# Patient Record
Sex: Male | Born: 1980
Health system: Southern US, Community
[De-identification: ages and names within clinical notes are randomized; demographics above are authoritative.]

## PROBLEM LIST (undated history)

## (undated) DIAGNOSIS — I10 Essential (primary) hypertension: Secondary | ICD-10-CM

## (undated) DIAGNOSIS — F410 Panic disorder [episodic paroxysmal anxiety] without agoraphobia: Secondary | ICD-10-CM

## (undated) DIAGNOSIS — R931 Abnormal findings on diagnostic imaging of heart and coronary circulation: Secondary | ICD-10-CM

## (undated) HISTORY — PX: OTHER SURGICAL HISTORY: SHX169

## (undated) HISTORY — DX: Panic disorder (episodic paroxysmal anxiety): F41.0

## (undated) HISTORY — DX: Abnormal findings on diagnostic imaging of heart and coronary circulation: R93.1

## (undated) HISTORY — DX: Essential (primary) hypertension: I10

## (undated) HISTORY — PX: MOLE REMOVAL: SHX2046

---

## 2019-04-29 ENCOUNTER — Ambulatory Visit: Payer: Self-pay | Admitting: Emergency Medicine

## 2019-05-01 DIAGNOSIS — Z20828 Contact with and (suspected) exposure to other viral communicable diseases: Secondary | ICD-10-CM | POA: Diagnosis not present

## 2019-05-04 DIAGNOSIS — Z20828 Contact with and (suspected) exposure to other viral communicable diseases: Secondary | ICD-10-CM | POA: Diagnosis not present

## 2019-05-09 DIAGNOSIS — Z20828 Contact with and (suspected) exposure to other viral communicable diseases: Secondary | ICD-10-CM | POA: Diagnosis not present

## 2019-05-16 ENCOUNTER — Other Ambulatory Visit: Payer: Self-pay

## 2019-05-16 ENCOUNTER — Ambulatory Visit (INDEPENDENT_AMBULATORY_CARE_PROVIDER_SITE_OTHER): Payer: BC Managed Care – PPO | Admitting: Emergency Medicine

## 2019-05-16 ENCOUNTER — Encounter: Payer: Self-pay | Admitting: Emergency Medicine

## 2019-05-16 VITALS — BP 126/72 | HR 86 | Temp 98.6°F | Resp 16 | Ht 71.0 in | Wt 312.6 lb

## 2019-05-16 DIAGNOSIS — B372 Candidiasis of skin and nail: Secondary | ICD-10-CM | POA: Diagnosis not present

## 2019-05-16 DIAGNOSIS — I10 Essential (primary) hypertension: Secondary | ICD-10-CM

## 2019-05-16 DIAGNOSIS — G4733 Obstructive sleep apnea (adult) (pediatric): Secondary | ICD-10-CM

## 2019-05-16 DIAGNOSIS — Z9989 Dependence on other enabling machines and devices: Secondary | ICD-10-CM

## 2019-05-16 DIAGNOSIS — Z7689 Persons encountering health services in other specified circumstances: Secondary | ICD-10-CM

## 2019-05-16 MED ORDER — LOSARTAN POTASSIUM 100 MG PO TABS: 100.0000 mg | ORAL_TABLET | Freq: Every day | ORAL | 3 refills | Status: DC

## 2019-05-16 MED ORDER — CLOTRIMAZOLE-BETAMETHASONE 1-0.05 % EX CREA: 1.0000 "application " | TOPICAL_CREAM | Freq: Two times a day (BID) | CUTANEOUS | 1 refills | Status: DC

## 2019-05-16 NOTE — Patient Instructions (Signed)

## 2019-05-16 NOTE — Progress Notes (Signed)
Brendan Gregory 39 y.o.   Chief Complaint  Patient presents with  . Establish Care  . Medication Refill    Losartan     HISTORY OF PRESENT ILLNESS: This is a 39 y.o. male first visit to this office, here to establish care with me. 1.  Hypertension: On losartan 100 mg daily. 2.  Obstructive sleep apnea: On CPAP therapy. Wife recently had Covid pneumonia.  Patient has tested negative for Covid several times. Complaining of itchy rash to right gluteal area for couple of months. Has intermittent leg pain for several years.  No injuries. No other complaints or medical concerns today.  HPI   Prior to Admission medications   Medication Sig Start Date End Date Taking? Authorizing Provider  Cholecalciferol (VITAMIN D3 PO) Take 5,000 Units by mouth daily.   Yes [provider]  losartan (COZAAR) 100 MG tablet Take 100 mg by mouth daily.   Yes [provider]  Multiple Vitamins-Minerals (EMERGEN-C IMMUNE) PACK Take by mouth daily.   Yes [provider]  Omega-3 Fatty Acids (FISH OIL) 1000 MG CAPS Take by mouth daily.   Yes [provider]    No Known Allergies  There are no problems to display for this patient.   No past medical history on file.    Social History   Socioeconomic History  . Marital status: Married    Spouse name: Not on file  . Number of children: Not on file  . Years of education: Not on file  . Highest education level: Not on file  Occupational History  . Not on file  Tobacco Use  . Smoking status: Never Smoker  . Smokeless tobacco: Never Used  Substance and Sexual Activity  . Alcohol use: Not on file    Comment: occasionally-beer  . Drug use: Yes    Frequency: 3.0 times per week    Types: Marijuana    Comment: per week  . Sexual activity: Not on file  Other Topics Concern  . Not on file  Social History Narrative  . Not on file   Social Determinants of Health   Financial Resource Strain:   . Difficulty of  Paying Living Expenses: Not on file  Food Insecurity:   . Worried About Charity fundraiser in the Last Year: Not on file  . Ran Out of Food in the Last Year: Not on file  Transportation Needs:   . Lack of Transportation (Medical): Not on file  . Lack of Transportation (Non-Medical): Not on file  Physical Activity:   . Days of Exercise per Week: Not on file  . Minutes of Exercise per Session: Not on file  Stress:   . Feeling of Stress : Not on file  Social Connections:   . Frequency of Communication with Friends and Family: Not on file  . Frequency of Social Gatherings with Friends and Family: Not on file  . Attends Religious Services: Not on file  . Active Member of Clubs or Organizations: Not on file  . Attends Archivist Meetings: Not on file  . Marital Status: Not on file  Intimate Partner Violence:   . Fear of Current or Ex-Partner: Not on file  . Emotionally Abused: Not on file  . Physically Abused: Not on file  . Sexually Abused: Not on file    No family history on file.   Review of Systems  Constitutional: Negative.  Negative for chills and fever.  HENT: Negative.  Negative for congestion and  sore throat.   Respiratory: Negative.  Negative for cough and shortness of breath.   Cardiovascular: Negative.  Negative for chest pain and palpitations.  Gastrointestinal: Negative.  Negative for abdominal pain, diarrhea, nausea and vomiting.  Genitourinary: Negative.  Negative for dysuria and hematuria.  Musculoskeletal: Positive for neck pain. Negative for back pain and joint pain.  Skin: Positive for itching and rash.  Neurological: Negative.  Negative for dizziness and headaches.  All other systems reviewed and are negative.  Vitals:   05/16/19 1015  BP: 126/72  Pulse: 86  Resp: 16  Temp: 98.6 F (37 C)  SpO2: 97%     Physical Exam Vitals reviewed.  Constitutional:      Appearance: He is obese.  HENT:     Head: Normocephalic.  Eyes:      Extraocular Movements: Extraocular movements intact.     Conjunctiva/sclera: Conjunctivae normal.     Pupils: Pupils are equal, round, and reactive to light.  Cardiovascular:     Rate and Rhythm: Normal rate and regular rhythm.     Pulses: Normal pulses.     Heart sounds: Normal heart sounds.  Pulmonary:     Effort: Pulmonary effort is normal.     Breath sounds: Normal breath sounds.  Musculoskeletal:        General: Normal range of motion.     Cervical back: Normal range of motion and neck supple.  Skin:    General: Skin is warm and dry.     Capillary Refill: Capillary refill takes less than 2 seconds.     Findings: Rash present.     Comments: Yeast dermatitis rash to right gluteal thigh skin fold  Neurological:     General: No focal deficit present.     Mental Status: He is alert and oriented to person, place, and time.  Psychiatric:        Mood and Affect: Mood normal.        Behavior: Behavior normal.      ASSESSMENT & PLAN: [name] was seen today for establish care and medication refill.  Diagnoses and all orders for this visit:  Essential hypertension -     losartan (COZAAR) 100 MG tablet; Take 1 tablet (100 mg total) by mouth daily.  Yeast dermatitis -     clotrimazole-betamethasone (LOTRISONE) cream; Apply 1 application topically 2 (two) times daily.  Encounter to establish care  Obstructive sleep apnea on CPAP    Patient Instructions  Health Maintenance, Male Adopting a healthy lifestyle and getting preventive care are important in promoting health and wellness. Ask your health care provider about:  The right schedule for you to have regular tests and exams.  Things you can do on your own to prevent diseases and keep yourself healthy. What should I know about diet, weight, and exercise? Eat a healthy diet   Eat a diet that includes plenty of vegetables, fruits, low-fat dairy products, and lean protein.  Do not eat a lot of foods that are high in solid  fats, added sugars, or sodium. Maintain a healthy weight Body mass index (BMI) is a measurement that can be used to identify possible weight problems. It estimates body fat based on height and weight. Your health care provider can help determine your BMI and help you achieve or maintain a healthy weight. Get regular exercise Get regular exercise. This is one of the most important things you can do for your health. Most adults should:  Exercise for at least 150 minutes  each week. The exercise should increase your heart rate and make you sweat (moderate-intensity exercise).  Do strengthening exercises at least twice a week. This is in addition to the moderate-intensity exercise.  Spend less time sitting. Even light physical activity can be beneficial. Watch cholesterol and blood lipids Have your blood tested for lipids and cholesterol at 38 years of age, then have this test every 5 years. You may need to have your cholesterol levels checked more often if:  Your lipid or cholesterol levels are high.  You are older than 38 years of age.  You are at high risk for heart disease. What should I know about cancer screening? Many types of cancers can be detected early and may often be prevented. Depending on your health history and family history, you may need to have cancer screening at various ages. This may include screening for:  Colorectal cancer.  Prostate cancer.  Skin cancer.  Lung cancer. What should I know about heart disease, diabetes, and high blood pressure? Blood pressure and heart disease  High blood pressure causes heart disease and increases the risk of stroke. This is more likely to develop in people who have high blood pressure readings, are of African descent, or are overweight.  Talk with your health care provider about your target blood pressure readings.  Have your blood pressure checked: ? Every 3-5 years if you are 47-2 years of age. ? Every year if you are 38  years old or older.  If you are between the ages of 30 and 47 and are a current or former smoker, ask your health care provider if you should have a one-time screening for abdominal aortic aneurysm (AAA). Diabetes Have regular diabetes screenings. This checks your fasting blood sugar level. Have the screening done:  Once every three years after age 32 if you are at a normal weight and have a low risk for diabetes.  More often and at a younger age if you are overweight or have a high risk for diabetes. What should I know about preventing infection? Hepatitis B If you have a higher risk for hepatitis B, you should be screened for this virus. Talk with your health care provider to find out if you are at risk for hepatitis B infection. Hepatitis C Blood testing is recommended for:  Everyone born from [birth year] through 1964.  Anyone with known risk factors for hepatitis C. Sexually transmitted infections (STIs)  You should be screened each year for STIs, including gonorrhea and chlamydia, if: ? You are sexually active and are younger than 38 years of age. ? You are older than 38 years of age and your health care provider tells you that you are at risk for this type of infection. ? Your sexual activity has changed since you were last screened, and you are at increased risk for chlamydia or gonorrhea. Ask your health care provider if you are at risk.  Ask your health care provider about whether you are at high risk for HIV. Your health care provider may recommend a prescription medicine to help prevent HIV infection. If you choose to take medicine to prevent HIV, you should first get tested for HIV. You should then be tested every 3 months for as long as you are taking the medicine. Follow these instructions at home: Lifestyle  Do not use any products that contain nicotine or tobacco, such as cigarettes, e-cigarettes, and chewing tobacco. If you need help quitting, ask your health care  provider.  Do  not use street drugs.  Do not share needles.  Ask your health care provider for help if you need support or information about quitting drugs. Alcohol use  Do not drink alcohol if your health care provider tells you not to drink.  If you drink alcohol: ? Limit how much you have to 0-2 drinks a day. ? Be aware of how much alcohol is in your drink. In the [place] one drink equals one 12 oz bottle of beer (355 mL), one 5 oz glass of wine (148 mL), or one 1 oz glass of hard liquor (44 mL). General instructions  Schedule regular health, dental, and eye exams.  Stay current with your vaccines.  Tell your health care provider if: ? You often feel depressed. ? You have ever been abused or do not feel safe at home. Summary  Adopting a healthy lifestyle and getting preventive care are important in promoting health and wellness.  Follow your health care provider's instructions about healthy diet, exercising, and getting tested or screened for diseases.  Follow your health care provider's instructions on monitoring your cholesterol and blood pressure. This information is not intended to replace advice given to you by your health care provider. Make sure you discuss any questions you have with your health care provider. Document Revised: 03/14/2018 Document Reviewed: 03/14/2018 Elsevier Patient Education  2020 [employer]      Agustina Caroli, MD [facility]

## 2019-06-19 ENCOUNTER — Encounter: Payer: BC Managed Care – PPO | Admitting: Emergency Medicine

## 2019-07-23 ENCOUNTER — Encounter: Payer: BC Managed Care – PPO | Admitting: Emergency Medicine

## 2019-08-13 ENCOUNTER — Encounter: Payer: Self-pay | Admitting: Emergency Medicine

## 2019-08-13 ENCOUNTER — Other Ambulatory Visit: Payer: Self-pay

## 2019-08-13 ENCOUNTER — Ambulatory Visit (INDEPENDENT_AMBULATORY_CARE_PROVIDER_SITE_OTHER): Payer: BC Managed Care – PPO | Admitting: Emergency Medicine

## 2019-08-13 VITALS — BP 144/88 | HR 85 | Temp 97.9°F | Resp 16 | Ht 70.0 in | Wt 325.0 lb

## 2019-08-13 DIAGNOSIS — Z1329 Encounter for screening for other suspected endocrine disorder: Secondary | ICD-10-CM

## 2019-08-13 DIAGNOSIS — I10 Essential (primary) hypertension: Secondary | ICD-10-CM

## 2019-08-13 DIAGNOSIS — Z0001 Encounter for general adult medical examination with abnormal findings: Secondary | ICD-10-CM

## 2019-08-13 DIAGNOSIS — G4733 Obstructive sleep apnea (adult) (pediatric): Secondary | ICD-10-CM

## 2019-08-13 DIAGNOSIS — Z13220 Encounter for screening for lipoid disorders: Secondary | ICD-10-CM

## 2019-08-13 DIAGNOSIS — M542 Cervicalgia: Secondary | ICD-10-CM

## 2019-08-13 DIAGNOSIS — Z13228 Encounter for screening for other metabolic disorders: Secondary | ICD-10-CM

## 2019-08-13 DIAGNOSIS — Z6841 Body Mass Index (BMI) 40.0 and over, adult: Secondary | ICD-10-CM

## 2019-08-13 DIAGNOSIS — Z0000 Encounter for general adult medical examination without abnormal findings: Secondary | ICD-10-CM

## 2019-08-13 DIAGNOSIS — Z125 Encounter for screening for malignant neoplasm of prostate: Secondary | ICD-10-CM

## 2019-08-13 DIAGNOSIS — G8929 Other chronic pain: Secondary | ICD-10-CM | POA: Diagnosis not present

## 2019-08-13 DIAGNOSIS — Z130 Encounter for screening for diseases of the blood and blood-forming organs and certain disorders involving the immune mechanism: Secondary | ICD-10-CM

## 2019-08-13 DIAGNOSIS — Z9989 Dependence on other enabling machines and devices: Secondary | ICD-10-CM

## 2019-08-13 MED ORDER — LOSARTAN POTASSIUM 100 MG PO TABS: 100.0000 mg | ORAL_TABLET | Freq: Every day | ORAL | 3 refills | Status: DC

## 2019-08-13 NOTE — Progress Notes (Signed)
Brendan Gregory 39 y.o.   Chief Complaint  Patient presents with  . Annual Exam  . Medication Refill    Losartan    HISTORY OF PRESENT ILLNESS: This is a 39 y.o. male here for his annual exam. Has history of hypertension on losartan 100 mg daily. Also has a history of OSA on CPAP therapy.  Requesting referral to sleep apnea specialist. Complaining of chronic neck pain.  Inquiring about chiropractic referral. Fully vaccinated against Covid. Positive family history of prostate cancer, father.  HPI   Prior to Admission medications   Medication Sig Start Date End Date Taking? Authorizing Provider  Cholecalciferol (VITAMIN D3 PO) Take 5,000 Units by mouth daily.   Yes [provider]  losartan (COZAAR) 100 MG tablet Take 1 tablet (100 mg total) by mouth daily. 05/16/19 08/14/19 Yes Brendan Gregory, Brendan Bloomer, MD  Multiple Vitamins-Minerals [facility] IMMUNE) PACK Take by mouth daily.   Yes [provider]  Omega-3 Fatty Acids (FISH OIL) 1000 MG CAPS Take by mouth daily.   Yes [provider]  clotrimazole-betamethasone (LOTRISONE) cream Apply 1 application topically 2 (two) times daily. Patient not taking: Reported on 08/13/2019 05/16/19   Brendan Pollen, MD    No Known Allergies  Patient Active Problem List   Diagnosis Date Noted  . Obstructive sleep apnea on CPAP 05/16/2019  . Essential hypertension 05/16/2019    Past Medical History:  Diagnosis Date  . Hypertension     History reviewed. No pertinent surgical history.  Social History   Socioeconomic History  . Marital status: Married    Spouse name: Not on file  . Number of children: Not on file  . Years of education: Not on file  . Highest education level: Not on file  Occupational History  . Not on file  Tobacco Use  . Smoking status: Never Smoker  . Smokeless tobacco: Never Used  Substance and Sexual Activity  . Alcohol use: Not on file    Comment: occasionally-beer  . Drug use:  Yes    Frequency: 3.0 times per week    Types: Marijuana    Comment: per week  . Sexual activity: Not on file  Other Topics Concern  . Not on file  Social History Narrative  . Not on file   Social Determinants of Health   Financial Resource Strain:   . Difficulty of Paying Living Expenses:   Food Insecurity:   . Worried About Charity fundraiser in the Last Year:   . Arboriculturist in the Last Year:   Transportation Needs:   . Film/video editor (Medical):   Marland Kitchen Lack of Transportation (Non-Medical):   Physical Activity:   . Days of Exercise per Week:   . Minutes of Exercise per Session:   Stress:   . Feeling of Stress :   Social Connections:   . Frequency of Communication with Friends and Family:   . Frequency of Social Gatherings with Friends and Family:   . Attends Religious Services:   . Active Member of Clubs or Organizations:   . Attends Archivist Meetings:   Marland Kitchen Marital Status:   Intimate Partner Violence:   . Fear of Current or Ex-Partner:   . Emotionally Abused:   Marland Kitchen Physically Abused:   . Sexually Abused:     Family History  Problem Relation Age of Onset  . Healthy Mother   . Prostate cancer Father   . Diabetes Father   . Hypertension Father   .  Diabetes Brother   . Hypertension Brother   . Diabetes Maternal Grandfather      Review of Systems  Constitutional: Negative.  Negative for chills and fever.  HENT: Negative.  Negative for congestion and sore throat.   Eyes: Negative.   Respiratory: Negative.  Negative for cough and shortness of breath.   Cardiovascular: Negative.  Negative for chest pain and palpitations.  Gastrointestinal: Negative.  Negative for abdominal pain, blood in stool, diarrhea, nausea and vomiting.  Genitourinary: Negative.  Negative for dysuria and hematuria.  Musculoskeletal: Positive for neck pain (Chronic neck pain).  Skin: Negative.  Negative for rash.  Neurological: Negative.  Negative for dizziness and  headaches.  Endo/Heme/Allergies: Negative.   All other systems reviewed and are negative.    Physical Exam Vitals reviewed.  Constitutional:      Appearance: He is obese.  HENT:     Head: Normocephalic.  Eyes:     Extraocular Movements: Extraocular movements intact.     Conjunctiva/sclera: Conjunctivae normal.     Pupils: Pupils are equal, round, and reactive to light.  Neck:     Vascular: No carotid bruit.  Cardiovascular:     Rate and Rhythm: Normal rate and regular rhythm.     Pulses: Normal pulses.     Heart sounds: Normal heart sounds.  Pulmonary:     Effort: Pulmonary effort is normal.     Breath sounds: Normal breath sounds.  Abdominal:     General: Bowel sounds are normal. There is no distension.     Palpations: Abdomen is soft. There is no mass.     Tenderness: There is no abdominal tenderness. There is no guarding.  Musculoskeletal:        General: No swelling or tenderness. Normal range of motion.     Cervical back: Normal range of motion and neck supple. No tenderness.     Right lower leg: No edema.     Left lower leg: No edema.  Lymphadenopathy:     Cervical: No cervical adenopathy.  Skin:    General: Skin is warm and dry.     Capillary Refill: Capillary refill takes less than 2 seconds.  Neurological:     General: No focal deficit present.     Mental Status: He is alert and oriented to person, place, and time.  Psychiatric:        Mood and Affect: Mood normal.        Behavior: Behavior normal.      ASSESSMENT & PLAN: Brendan Gregory was seen today for annual exam and medication refill.  Diagnoses and all orders for this visit:  Routine general medical examination at a health care facility  Essential hypertension -     losartan (COZAAR) 100 MG tablet; Take 1 tablet (100 mg total) by mouth daily.  Obstructive sleep apnea on CPAP -     Ambulatory referral to Pulmonology  Prostate cancer screening -     PSA(Must document that pt has been informed of  limitations of PSA testing.)  Screening for deficiency anemia -     CBC with Differential  Screening for lipoid disorders -     Lipid panel  Screening for endocrine, metabolic and immunity disorder -     Comprehensive metabolic panel -     Hemoglobin A1c -     TSH -     HIV antibody  Body mass index (BMI) of 45.0-49.9 in adult [facility])  Chronic neck pain -     Ambulatory referral  to Chiropractic    Patient Instructions       If you have lab work done today you will be contacted with your lab results within the next 2 weeks.  If you have not heard from [place] then please contact us. The fastest way to get your results is to register for My Chart.   IF you received an x-ray today, you will receive an invoice from [facility]. Please contact [facility] at [phone] with questions or concerns regarding your invoice.   IF you received labwork today, you will receive an invoice from [facility]. Please contact [facility] at [phone] with questions or concerns regarding your invoice.   Our billing staff will not be able to assist you with questions regarding bills from these companies.  You will be contacted with the lab results as soon as they are available. The fastest way to get your results is to activate your My Chart account. Instructions are located on the last page of this paperwork. If you have not heard from [place] regarding the results in 2 weeks, please contact this office.      Health Maintenance, Male Adopting a healthy lifestyle and getting preventive care are important in promoting health and wellness. Ask your health care provider about:  The right schedule for you to have regular tests and exams.  Things you can do on your own to prevent diseases and keep yourself healthy. What should I know about diet, weight, and exercise? Eat a healthy diet   Eat a diet that includes plenty of vegetables, fruits, low-fat dairy products, and lean  protein.  Do not eat a lot of foods that are high in solid fats, added sugars, or sodium. Maintain a healthy weight Body mass index (BMI) is a measurement that can be used to identify possible weight problems. It estimates body fat based on height and weight. Your health care provider can help determine your BMI and help you achieve or maintain a healthy weight. Get regular exercise Get regular exercise. This is one of the most important things you can do for your health. Most adults should:  Exercise for at least 150 minutes each week. The exercise should increase your heart rate and make you sweat (moderate-intensity exercise).  Do strengthening exercises at least twice a week. This is in addition to the moderate-intensity exercise.  Spend less time sitting. Even light physical activity can be beneficial. Watch cholesterol and blood lipids Have your blood tested for lipids and cholesterol at 38 years of age, then have this test every 5 years. You may need to have your cholesterol levels checked more often if:  Your lipid or cholesterol levels are high.  You are older than 38 years of age.  You are at high risk for heart disease. What should I know about cancer screening? Many types of cancers can be detected early and may often be prevented. Depending on your health history and family history, you may need to have cancer screening at various ages. This may include screening for:  Colorectal cancer.  Prostate cancer.  Skin cancer.  Lung cancer. What should I know about heart disease, diabetes, and high blood pressure? Blood pressure and heart disease  High blood pressure causes heart disease and increases the risk of stroke. This is more likely to develop in people who have high blood pressure readings, are of African descent, or are overweight.  Talk with your health care provider about your target blood pressure readings.  Have your blood  pressure checked: ? Every 3-5 years  if you are 62-68 years of age. ? Every year if you are 48 years old or older.  If you are between the ages of 58 and 69 and are a current or former smoker, ask your health care provider if you should have a one-time screening for abdominal aortic aneurysm (AAA). Diabetes Have regular diabetes screenings. This checks your fasting blood sugar level. Have the screening done:  Once every three years after age 90 if you are at a normal weight and have a low risk for diabetes.  More often and at a younger age if you are overweight or have a high risk for diabetes. What should I know about preventing infection? Hepatitis B If you have a higher risk for hepatitis B, you should be screened for this virus. Talk with your health care provider to find out if you are at risk for hepatitis B infection. Hepatitis C Blood testing is recommended for:  Everyone born from [birth year] through 1964.  Anyone with known risk factors for hepatitis C. Sexually transmitted infections (STIs)  You should be screened each year for STIs, including gonorrhea and chlamydia, if: ? You are sexually active and are younger than 38 years of age. ? You are older than 38 years of age and your health care provider tells you that you are at risk for this type of infection. ? Your sexual activity has changed since you were last screened, and you are at increased risk for chlamydia or gonorrhea. Ask your health care provider if you are at risk.  Ask your health care provider about whether you are at high risk for HIV. Your health care provider may recommend a prescription medicine to help prevent HIV infection. If you choose to take medicine to prevent HIV, you should first get tested for HIV. You should then be tested every 3 months for as long as you are taking the medicine. Follow these instructions at home: Lifestyle  Do not use any products that contain nicotine or tobacco, such as cigarettes, e-cigarettes, and chewing tobacco. If  you need help quitting, ask your health care provider.  Do not use street drugs.  Do not share needles.  Ask your health care provider for help if you need support or information about quitting drugs. Alcohol use  Do not drink alcohol if your health care provider tells you not to drink.  If you drink alcohol: ? Limit how much you have to 0-2 drinks a day. ? Be aware of how much alcohol is in your drink. In the [place] one drink equals one 12 oz bottle of beer (355 mL), one 5 oz glass of wine (148 mL), or one 1 oz glass of hard liquor (44 mL). General instructions  Schedule regular health, dental, and eye exams.  Stay current with your vaccines.  Tell your health care provider if: ? You often feel depressed. ? You have ever been abused or do not feel safe at home. Summary  Adopting a healthy lifestyle and getting preventive care are important in promoting health and wellness.  Follow your health care provider's instructions about healthy diet, exercising, and getting tested or screened for diseases.  Follow your health care provider's instructions on monitoring your cholesterol and blood pressure. This information is not intended to replace advice given to you by your health care provider. Make sure you discuss any questions you have with your health care provider. Document Revised: 03/14/2018 Document Reviewed: 03/14/2018 Elsevier Patient Education  [place] [facility] Weight [facility] Calories are units of energy. Your body needs a certain amount of calories from food to keep you going throughout the day. When you eat more calories than your body needs, your body stores the extra calories as fat. When you eat fewer calories than your body needs, your body burns fat to get the energy it needs. Calorie counting means keeping track of how many calories you eat and drink each day. Calorie counting can be helpful if you need to lose weight. If you make sure to eat  fewer calories than your body needs, you should lose weight. Ask your health care provider what a healthy weight is for you. For calorie counting to work, you will need to eat the right number of calories in a day in order to lose a healthy amount of weight per week. A dietitian can help you determine how many calories you need in a day and will give you suggestions on how to reach your calorie goal.  A healthy amount of weight to lose per week is usually 1-2 lb (0.5-0.9 kg). This usually means that your daily calorie intake should be reduced by 500-750 calories.  Eating 1,200 - 1,500 calories per day can help most women lose weight.  Eating 1,500 - 1,800 calories per day can help most men lose weight. What is my plan? My goal is to have __________ calories per day. If I have this many calories per day, I should lose around __________ pounds per week. What do I need to know about calorie counting? In order to meet your daily calorie goal, you will need to:  Find out how many calories are in each food you would like to eat. Try to do this before you eat.  Decide how much of the food you plan to eat.  Write down what you ate and how many calories it had. Doing this is called keeping a food log. To successfully lose weight, it is important to balance calorie counting with a healthy lifestyle that includes regular activity. Aim for 150 minutes of moderate exercise (such as walking) or 75 minutes of vigorous exercise (such as running) each week. Where do I find calorie information?  The number of calories in a food can be found on a Nutrition Facts label. If a food does not have a Nutrition Facts label, try to look up the calories online or ask your dietitian for help. Remember that calories are listed per serving. If you choose to have more than one serving of a food, you will have to multiply the calories per serving by the amount of servings you plan to eat. For example, the label on a package  of bread might say that a serving size is 1 slice and that there are 90 calories in a serving. If you eat 1 slice, you will have eaten 90 calories. If you eat 2 slices, you will have eaten 180 calories. How do I keep a food log? Immediately after each meal, record the following information in your food log:  What you ate. Don't forget to include toppings, sauces, and other extras on the food.  How much you ate. This can be measured in cups, ounces, or number of items.  How many calories each food and drink had.  The total number of calories in the meal. Keep your food log near you, such as in a small notebook in your pocket, or use a mobile app  or website. Some programs will calculate calories for you and show you how many calories you have left for the day to meet your goal. What are some calorie counting tips?   Use your calories on foods and drinks that will fill you up and not leave you hungry: ? Some examples of foods that fill you up are nuts and nut butters, vegetables, lean proteins, and high-fiber foods like whole grains. High-fiber foods are foods with more than 5 g fiber per serving. ? Drinks such as sodas, specialty coffee drinks, alcohol, and juices have a lot of calories, yet do not fill you up.  Eat nutritious foods and avoid empty calories. Empty calories are calories you get from foods or beverages that do not have many vitamins or protein, such as candy, sweets, and soda. It is better to have a nutritious high-calorie food (such as an avocado) than a food with few nutrients (such as a bag of chips).  Know how many calories are in the foods you eat most often. This will help you calculate calorie counts faster.  Pay attention to calories in drinks. Low-calorie drinks include water and unsweetened drinks.  Pay attention to nutrition labels for "low fat" or "fat free" foods. These foods sometimes have the same amount of calories or more calories than the full fat versions.  They also often have added sugar, starch, or salt, to make up for flavor that was removed with the fat.  Find a way of tracking calories that works for you. Get creative. Try different apps or programs if writing down calories does not work for you. What are some portion control tips?  Know how many calories are in a serving. This will help you know how many servings of a certain food you can have.  Use a measuring cup to measure serving sizes. You could also try weighing out portions on a kitchen scale. With time, you will be able to estimate serving sizes for some foods.  Take some time to put servings of different foods on your favorite plates, bowls, and cups so you know what a serving looks like.  Try not to eat straight from a bag or box. Doing this can lead to overeating. Put the amount you would like to eat in a cup or on a plate to make sure you are eating the right portion.  Use smaller plates, glasses, and bowls to prevent overeating.  Try not to multitask (for example, watch TV or use your computer) while eating. If it is time to eat, sit down at a table and enjoy your food. This will help you to know when you are full. It will also help you to be aware of what you are eating and how much you are eating. What are tips for following this plan? Reading food labels  Check the calorie count compared to the serving size. The serving size may be smaller than what you are used to eating.  Check the source of the calories. Make sure the food you are eating is high in vitamins and protein and low in saturated and trans fats. Shopping  Read nutrition labels while you shop. This will help you make healthy decisions before you decide to purchase your food.  Make a grocery list and stick to it. Cooking  Try to cook your favorite foods in a healthier way. For example, try baking instead of frying.  Use low-fat dairy products. Meal planning  Use more fruits and vegetables. Half of  your  plate should be fruits and vegetables.  Include lean proteins like poultry and fish. How do I count calories when eating out?  Ask for smaller portion sizes.  Consider sharing an entree and sides instead of getting your own entree.  If you get your own entree, eat only half. Ask for a box at the beginning of your meal and put the rest of your entree in it so you are not tempted to eat it.  If calories are listed on the menu, choose the lower calorie options.  Choose dishes that include vegetables, fruits, whole grains, low-fat dairy products, and lean protein.  Choose items that are boiled, broiled, grilled, or steamed. Stay away from items that are buttered, battered, fried, or served with cream sauce. Items labeled "crispy" are usually fried, unless stated otherwise.  Choose water, low-fat milk, unsweetened iced tea, or other drinks without added sugar. If you want an alcoholic beverage, choose a lower calorie option such as a glass of wine or light beer.  Ask for dressings, sauces, and syrups on the side. These are usually high in calories, so you should limit the amount you eat.  If you want a salad, choose a garden salad and ask for grilled meats. Avoid extra toppings like bacon, cheese, or fried items. Ask for the dressing on the side, or ask for olive oil and vinegar or lemon to use as dressing.  Estimate how many servings of a food you are given. For example, a serving of cooked rice is  cup or about the size of half a baseball. Knowing serving sizes will help you be aware of how much food you are eating at restaurants. The list below tells you how big or small some common portion sizes are based on everyday objects: ? 1 oz--4 stacked dice. ? 3 oz--1 deck of cards. ? 1 tsp--1 die. ? 1 Tbsp-- a ping-pong ball. ? 2 Tbsp--1 ping-pong ball. ?  cup-- baseball. ? 1 cup--1 baseball. Summary  Calorie counting means keeping track of how many calories you eat and drink each day. If  you eat fewer calories than your body needs, you should lose weight.  A healthy amount of weight to lose per week is usually 1-2 lb (0.5-0.9 kg). This usually means reducing your daily calorie intake by 500-750 calories.  The number of calories in a food can be found on a Nutrition Facts label. If a food does not have a Nutrition Facts label, try to look up the calories online or ask your dietitian for help.  Use your calories on foods and drinks that will fill you up, and not on foods and drinks that will leave you hungry.  Use smaller plates, glasses, and bowls to prevent overeating. This information is not intended to replace advice given to you by your health care provider. Make sure you discuss any questions you have with your health care provider. Document Revised: 12/08/2017 Document Reviewed: 02/19/2016 Elsevier Patient Education  2020 Elsevier Inc.      Agustina Caroli, MD [facility]

## 2019-08-13 NOTE — Patient Instructions (Addendum)
   If you have lab work done today you will be contacted with your lab results within the next 2 weeks.  If you have not heard from us then please contact us. The fastest way to get your results is to register for My Chart.   IF you received an x-ray today, you will receive an invoice from [facility]. Please contact  Radiology at [phone] with questions or concerns regarding your invoice.   IF you received labwork today, you will receive an invoice from [facility]. Please contact [facility] at [phone] with questions or concerns regarding your invoice.   Our billing staff will not be able to assist you with questions regarding bills from these companies.  You will be contacted with the lab results as soon as they are available. The fastest way to get your results is to activate your My Chart account. Instructions are located on the last page of this paperwork. If you have not heard from us regarding the results in 2 weeks, please contact this office.      Health Maintenance, Male Adopting a healthy lifestyle and getting preventive care are important in promoting health and wellness. Ask your health care provider about:  The right schedule for you to have regular tests and exams.  Things you can do on your own to prevent diseases and keep yourself healthy. What should I know about diet, weight, and exercise? Eat a healthy diet   Eat a diet that includes plenty of vegetables, fruits, low-fat dairy products, and lean protein.  Do not eat a lot of foods that are high in solid fats, added sugars, or sodium. Maintain a healthy weight Body mass index (BMI) is a measurement that can be used to identify possible weight problems. It estimates body fat based on height and weight. Your health care provider can help determine your BMI and help you achieve or maintain a healthy weight. Get regular exercise Get regular exercise. This is one of the most important things you  can do for your health. Most adults should:  Exercise for at least 150 minutes each week. The exercise should increase your heart rate and make you sweat (moderate-intensity exercise).  Do strengthening exercises at least twice a week. This is in addition to the moderate-intensity exercise.  Spend less time sitting. Even light physical activity can be beneficial. Watch cholesterol and blood lipids Have your blood tested for lipids and cholesterol at 38 years of age, then have this test every 5 years. You may need to have your cholesterol levels checked more often if:  Your lipid or cholesterol levels are high.  You are older than 38 years of age.  You are at high risk for heart disease. What should I know about cancer screening? Many types of cancers can be detected early and may often be prevented. Depending on your health history and family history, you may need to have cancer screening at various ages. This may include screening for:  Colorectal cancer.  Prostate cancer.  Skin cancer.  Lung cancer. What should I know about heart disease, diabetes, and high blood pressure? Blood pressure and heart disease  High blood pressure causes heart disease and increases the risk of stroke. This is more likely to develop in people who have high blood pressure readings, are of African descent, or are overweight.  Talk with your health care provider about your target blood pressure readings.  Have your blood pressure checked: ? Every 3-5 years if you are 18-39   years of age. ? Every year if you are 40 years old or older.  If you are between the ages of 65 and 75 and are a current or former smoker, ask your health care provider if you should have a one-time screening for abdominal aortic aneurysm (AAA). Diabetes Have regular diabetes screenings. This checks your fasting blood sugar level. Have the screening done:  Once every three years after age 45 if you are at a normal weight and have  a low risk for diabetes.  More often and at a younger age if you are overweight or have a high risk for diabetes. What should I know about preventing infection? Hepatitis B If you have a higher risk for hepatitis B, you should be screened for this virus. Talk with your health care provider to find out if you are at risk for hepatitis B infection. Hepatitis C Blood testing is recommended for:  Everyone born from 1945 through 1965.  Anyone with known risk factors for hepatitis C. Sexually transmitted infections (STIs)  You should be screened each year for STIs, including gonorrhea and chlamydia, if: ? You are sexually active and are younger than 38 years of age. ? You are older than 38 years of age and your health care provider tells you that you are at risk for this type of infection. ? Your sexual activity has changed since you were last screened, and you are at increased risk for chlamydia or gonorrhea. Ask your health care provider if you are at risk.  Ask your health care provider about whether you are at high risk for HIV. Your health care provider may recommend a prescription medicine to help prevent HIV infection. If you choose to take medicine to prevent HIV, you should first get tested for HIV. You should then be tested every 3 months for as long as you are taking the medicine. Follow these instructions at home: Lifestyle  Do not use any products that contain nicotine or tobacco, such as cigarettes, e-cigarettes, and chewing tobacco. If you need help quitting, ask your health care provider.  Do not use street drugs.  Do not share needles.  Ask your health care provider for help if you need support or information about quitting drugs. Alcohol use  Do not drink alcohol if your health care provider tells you not to drink.  If you drink alcohol: ? Limit how much you have to 0-2 drinks a day. ? Be aware of how much alcohol is in your drink. In the [place] one drink equals one 12  oz bottle of beer (355 mL), one 5 oz glass of wine (148 mL), or one 1 oz glass of hard liquor (44 mL). General instructions  Schedule regular health, dental, and eye exams.  Stay current with your vaccines.  Tell your health care provider if: ? You often feel depressed. ? You have ever been abused or do not feel safe at home. Summary  Adopting a healthy lifestyle and getting preventive care are important in promoting health and wellness.  Follow your health care provider's instructions about healthy diet, exercising, and getting tested or screened for diseases.  Follow your health care provider's instructions on monitoring your cholesterol and blood pressure. This information is not intended to replace advice given to you by your health care provider. Make sure you discuss any questions you have with your health care provider. Document Revised: 03/14/2018 Document Reviewed: 03/14/2018 Elsevier Patient Education  2020 Elsevier Inc.   Calorie Counting for Weight   Loss Calories are units of energy. Your body needs a certain amount of calories from food to keep you going throughout the day. When you eat more calories than your body needs, your body stores the extra calories as fat. When you eat fewer calories than your body needs, your body burns fat to get the energy it needs. Calorie counting means keeping track of how many calories you eat and drink each day. Calorie counting can be helpful if you need to lose weight. If you make sure to eat fewer calories than your body needs, you should lose weight. Ask your health care provider what a healthy weight is for you. For calorie counting to work, you will need to eat the right number of calories in a day in order to lose a healthy amount of weight per week. A dietitian can help you determine how many calories you need in a day and will give you suggestions on how to reach your calorie goal.  A healthy amount of weight to lose per week is  usually 1-2 lb (0.5-0.9 kg). This usually means that your daily calorie intake should be reduced by 500-750 calories.  Eating 1,200 - 1,500 calories per day can help most women lose weight.  Eating 1,500 - 1,800 calories per day can help most men lose weight. What is my plan? My goal is to have __________ calories per day. If I have this many calories per day, I should lose around __________ pounds per week. What do I need to know about calorie counting? In order to meet your daily calorie goal, you will need to:  Find out how many calories are in each food you would like to eat. Try to do this before you eat.  Decide how much of the food you plan to eat.  Write down what you ate and how many calories it had. Doing this is called keeping a food log. To successfully lose weight, it is important to balance calorie counting with a healthy lifestyle that includes regular activity. Aim for 150 minutes of moderate exercise (such as walking) or 75 minutes of vigorous exercise (such as running) each week. Where do I find calorie information?  The number of calories in a food can be found on a Nutrition Facts label. If a food does not have a Nutrition Facts label, try to look up the calories online or ask your dietitian for help. Remember that calories are listed per serving. If you choose to have more than one serving of a food, you will have to multiply the calories per serving by the amount of servings you plan to eat. For example, the label on a package of bread might say that a serving size is 1 slice and that there are 90 calories in a serving. If you eat 1 slice, you will have eaten 90 calories. If you eat 2 slices, you will have eaten 180 calories. How do I keep a food log? Immediately after each meal, record the following information in your food log:  What you ate. Don't forget to include toppings, sauces, and other extras on the food.  How much you ate. This can be measured in cups,  ounces, or number of items.  How many calories each food and drink had.  The total number of calories in the meal. Keep your food log near you, such as in a small notebook in your pocket, or use a mobile app or website. Some programs will calculate calories for you   and show you how many calories you have left for the day to meet your goal. What are some calorie counting tips?   Use your calories on foods and drinks that will fill you up and not leave you hungry: ? Some examples of foods that fill you up are nuts and nut butters, vegetables, lean proteins, and high-fiber foods like whole grains. High-fiber foods are foods with more than 5 g fiber per serving. ? Drinks such as sodas, specialty coffee drinks, alcohol, and juices have a lot of calories, yet do not fill you up.  Eat nutritious foods and avoid empty calories. Empty calories are calories you get from foods or beverages that do not have many vitamins or protein, such as candy, sweets, and soda. It is better to have a nutritious high-calorie food (such as an avocado) than a food with few nutrients (such as a bag of chips).  Know how many calories are in the foods you eat most often. This will help you calculate calorie counts faster.  Pay attention to calories in drinks. Low-calorie drinks include water and unsweetened drinks.  Pay attention to nutrition labels for "low fat" or "fat free" foods. These foods sometimes have the same amount of calories or more calories than the full fat versions. They also often have added sugar, starch, or salt, to make up for flavor that was removed with the fat.  Find a way of tracking calories that works for you. Get creative. Try different apps or programs if writing down calories does not work for you. What are some portion control tips?  Know how many calories are in a serving. This will help you know how many servings of a certain food you can have.  Use a measuring cup to measure serving  sizes. You could also try weighing out portions on a kitchen scale. With time, you will be able to estimate serving sizes for some foods.  Take some time to put servings of different foods on your favorite plates, bowls, and cups so you know what a serving looks like.  Try not to eat straight from a bag or box. Doing this can lead to overeating. Put the amount you would like to eat in a cup or on a plate to make sure you are eating the right portion.  Use smaller plates, glasses, and bowls to prevent overeating.  Try not to multitask (for example, watch TV or use your computer) while eating. If it is time to eat, sit down at a table and enjoy your food. This will help you to know when you are full. It will also help you to be aware of what you are eating and how much you are eating. What are tips for following this plan? Reading food labels  Check the calorie count compared to the serving size. The serving size may be smaller than what you are used to eating.  Check the source of the calories. Make sure the food you are eating is high in vitamins and protein and low in saturated and trans fats. Shopping  Read nutrition labels while you shop. This will help you make healthy decisions before you decide to purchase your food.  Make a grocery list and stick to it. Cooking  Try to cook your favorite foods in a healthier way. For example, try baking instead of frying.  Use low-fat dairy products. Meal planning  Use more fruits and vegetables. Half of your plate should be fruits and vegetables.  Include   lean proteins like poultry and fish. How do I count calories when eating out?  Ask for smaller portion sizes.  Consider sharing an entree and sides instead of getting your own entree.  If you get your own entree, eat only half. Ask for a box at the beginning of your meal and put the rest of your entree in it so you are not tempted to eat it.  If calories are listed on the menu, choose  the lower calorie options.  Choose dishes that include vegetables, fruits, whole grains, low-fat dairy products, and lean protein.  Choose items that are boiled, broiled, grilled, or steamed. Stay away from items that are buttered, battered, fried, or served with cream sauce. Items labeled "crispy" are usually fried, unless stated otherwise.  Choose water, low-fat milk, unsweetened iced tea, or other drinks without added sugar. If you want an alcoholic beverage, choose a lower calorie option such as a glass of wine or light beer.  Ask for dressings, sauces, and syrups on the side. These are usually high in calories, so you should limit the amount you eat.  If you want a salad, choose a garden salad and ask for grilled meats. Avoid extra toppings like bacon, cheese, or fried items. Ask for the dressing on the side, or ask for olive oil and vinegar or lemon to use as dressing.  Estimate how many servings of a food you are given. For example, a serving of cooked rice is  cup or about the size of half a baseball. Knowing serving sizes will help you be aware of how much food you are eating at restaurants. The list below tells you how big or small some common portion sizes are based on everyday objects: ? 1 oz--4 stacked dice. ? 3 oz--1 deck of cards. ? 1 tsp--1 die. ? 1 Tbsp-- a ping-pong ball. ? 2 Tbsp--1 ping-pong ball. ?  cup-- baseball. ? 1 cup--1 baseball. Summary  Calorie counting means keeping track of how many calories you eat and drink each day. If you eat fewer calories than your body needs, you should lose weight.  A healthy amount of weight to lose per week is usually 1-2 lb (0.5-0.9 kg). This usually means reducing your daily calorie intake by 500-750 calories.  The number of calories in a food can be found on a Nutrition Facts label. If a food does not have a Nutrition Facts label, try to look up the calories online or ask your dietitian for help.  Use your calories on foods  and drinks that will fill you up, and not on foods and drinks that will leave you hungry.  Use smaller plates, glasses, and bowls to prevent overeating. This information is not intended to replace advice given to you by your health care provider. Make sure you discuss any questions you have with your health care provider. Document Revised: 12/08/2017 Document Reviewed: 02/19/2016 Elsevier Patient Education  2020 Elsevier Inc.  

## 2019-08-13 NOTE — Addendum Note (Signed)
Addended by: Burnis Kingfisher on: 08/13/2019 10:23 AM   Modules accepted: Orders

## 2019-08-19 ENCOUNTER — Institutional Professional Consult (permissible substitution): Payer: BC Managed Care – PPO | Admitting: Internal Medicine

## 2019-08-19 ENCOUNTER — Other Ambulatory Visit: Payer: Self-pay

## 2019-08-19 ENCOUNTER — Ambulatory Visit (INDEPENDENT_AMBULATORY_CARE_PROVIDER_SITE_OTHER): Payer: BC Managed Care – PPO | Admitting: Emergency Medicine

## 2019-08-19 DIAGNOSIS — Z13228 Encounter for screening for other metabolic disorders: Secondary | ICD-10-CM

## 2019-08-19 DIAGNOSIS — Z130 Encounter for screening for diseases of the blood and blood-forming organs and certain disorders involving the immune mechanism: Secondary | ICD-10-CM

## 2019-08-19 DIAGNOSIS — Z13220 Encounter for screening for lipoid disorders: Secondary | ICD-10-CM

## 2019-08-19 DIAGNOSIS — Z125 Encounter for screening for malignant neoplasm of prostate: Secondary | ICD-10-CM

## 2019-08-19 DIAGNOSIS — Z6841 Body Mass Index (BMI) 40.0 and over, adult: Secondary | ICD-10-CM

## 2019-08-19 DIAGNOSIS — Z1329 Encounter for screening for other suspected endocrine disorder: Secondary | ICD-10-CM | POA: Diagnosis not present

## 2019-08-19 NOTE — Progress Notes (Signed)
Lab only visit 

## 2019-08-20 ENCOUNTER — Encounter: Payer: Self-pay | Admitting: Emergency Medicine

## 2019-08-20 LAB — CBC WITH DIFFERENTIAL/PLATELET
Basophils Absolute: 0.1 10*3/uL (ref 0.0–0.2)
Basos: 1 %
EOS (ABSOLUTE): 0.2 10*3/uL (ref 0.0–0.4)
Eos: 3 %
Hematocrit: 45 % (ref 37.5–51.0)
Hemoglobin: 15 g/dL (ref 13.0–17.7)
Immature Grans (Abs): 0.1 10*3/uL (ref 0.0–0.1)
Immature Granulocytes: 1 %
Lymphocytes Absolute: 2.9 10*3/uL (ref 0.7–3.1)
Lymphs: 34 %
MCH: 31.1 pg (ref 26.6–33.0)
MCHC: 33.3 g/dL (ref 31.5–35.7)
MCV: 93 fL (ref 79–97)
Monocytes Absolute: 0.7 10*3/uL (ref 0.1–0.9)
Monocytes: 8 %
Neutrophils Absolute: 4.6 10*3/uL (ref 1.4–7.0)
Neutrophils: 53 %
Platelets: 321 10*3/uL (ref 150–450)
RBC: 4.82 x10E6/uL (ref 4.14–5.80)
RDW: 11.9 % (ref 11.6–15.4)
WBC: 8.5 10*3/uL (ref 3.4–10.8)

## 2019-08-20 LAB — COMPREHENSIVE METABOLIC PANEL
ALT: 40 IU/L (ref 0–44)
AST: 31 IU/L (ref 0–40)
Albumin/Globulin Ratio: 1.3 (ref 1.2–2.2)
Albumin: 4.4 g/dL (ref 4.0–5.0)
Alkaline Phosphatase: 87 IU/L (ref 48–121)
BUN/Creatinine Ratio: 12 (ref 9–20)
BUN: 14 mg/dL (ref 6–20)
Bilirubin Total: 0.6 mg/dL (ref 0.0–1.2)
CO2: 23 mmol/L (ref 20–29)
Calcium: 9.5 mg/dL (ref 8.7–10.2)
Chloride: 97 mmol/L (ref 96–106)
Creatinine, Ser: 1.16 mg/dL (ref 0.76–1.27)
GFR calc Af Amer: 92 mL/min/{1.73_m2} (ref >59)
GFR calc non Af Amer: 80 mL/min/{1.73_m2} (ref >59)
Globulin, Total: 3.3 g/dL (ref 1.5–4.5)
Glucose: 102 mg/dL — ABNORMAL HIGH (ref 65–99)
Potassium: 4.3 mmol/L (ref 3.5–5.2)
Sodium: 138 mmol/L (ref 134–144)
Total Protein: 7.7 g/dL (ref 6.0–8.5)

## 2019-08-20 LAB — LIPID PANEL
Chol/HDL Ratio: 5.1 ratio — ABNORMAL HIGH (ref 0.0–5.0)
Cholesterol, Total: 164 mg/dL (ref 100–199)
HDL: 32 mg/dL — ABNORMAL LOW (ref >39)
LDL Chol Calc (NIH): 103 mg/dL — ABNORMAL HIGH (ref 0–99)
Triglycerides: 164 mg/dL — ABNORMAL HIGH (ref 0–149)
VLDL Cholesterol Cal: 29 mg/dL (ref 5–40)

## 2019-08-20 LAB — HIV ANTIBODY (ROUTINE TESTING W REFLEX): HIV Screen 4th Generation wRfx: NONREACTIVE

## 2019-08-20 LAB — TSH: TSH: 1.12 u[IU]/mL (ref 0.450–4.500)

## 2019-08-20 LAB — HEMOGLOBIN A1C
Est. average glucose Bld gHb Est-mCnc: 114 mg/dL
Hgb A1c MFr Bld: 5.6 % (ref 4.8–5.6)

## 2019-08-20 LAB — PSA: Prostate Specific Ag, Serum: 0.3 ng/mL (ref 0.0–4.0)

## 2019-09-11 DIAGNOSIS — M542 Cervicalgia: Secondary | ICD-10-CM | POA: Diagnosis not present

## 2019-09-12 DIAGNOSIS — M9901 Segmental and somatic dysfunction of cervical region: Secondary | ICD-10-CM | POA: Diagnosis not present

## 2019-09-12 DIAGNOSIS — M9905 Segmental and somatic dysfunction of pelvic region: Secondary | ICD-10-CM | POA: Diagnosis not present

## 2019-09-12 DIAGNOSIS — M9903 Segmental and somatic dysfunction of lumbar region: Secondary | ICD-10-CM | POA: Diagnosis not present

## 2019-09-12 DIAGNOSIS — M9902 Segmental and somatic dysfunction of thoracic region: Secondary | ICD-10-CM | POA: Diagnosis not present

## 2019-09-13 DIAGNOSIS — M9905 Segmental and somatic dysfunction of pelvic region: Secondary | ICD-10-CM | POA: Diagnosis not present

## 2019-09-13 DIAGNOSIS — M9903 Segmental and somatic dysfunction of lumbar region: Secondary | ICD-10-CM | POA: Diagnosis not present

## 2019-09-13 DIAGNOSIS — M9902 Segmental and somatic dysfunction of thoracic region: Secondary | ICD-10-CM | POA: Diagnosis not present

## 2019-09-13 DIAGNOSIS — M9901 Segmental and somatic dysfunction of cervical region: Secondary | ICD-10-CM | POA: Diagnosis not present

## 2019-09-16 ENCOUNTER — Encounter: Payer: Self-pay | Admitting: Pulmonary Disease

## 2019-09-16 ENCOUNTER — Ambulatory Visit (INDEPENDENT_AMBULATORY_CARE_PROVIDER_SITE_OTHER): Payer: BC Managed Care – PPO | Admitting: Pulmonary Disease

## 2019-09-16 ENCOUNTER — Other Ambulatory Visit: Payer: Self-pay

## 2019-09-16 VITALS — BP 138/82 | HR 86 | Temp 98.6°F | Ht 71.0 in | Wt 332.4 lb

## 2019-09-16 DIAGNOSIS — Z9989 Dependence on other enabling machines and devices: Secondary | ICD-10-CM | POA: Diagnosis not present

## 2019-09-16 DIAGNOSIS — G4733 Obstructive sleep apnea (adult) (pediatric): Secondary | ICD-10-CM | POA: Diagnosis not present

## 2019-09-16 NOTE — Patient Instructions (Signed)
Obstructive sleep apnea adequately treated with CPAP therapy  Continue using CPAP on a regular basis  Call us with any significant concerns  We will follow-up with you in about a year

## 2019-09-16 NOTE — Progress Notes (Signed)
Brendan Gregory    [id number]    02-Jul-1980  Primary Care Physician:Sagardia, Ines Bloomer, MD  Referring Physician: Horald Pollen, MD [place],  [place] [address]  Chief complaint:   Patient with a history of obstructive sleep apnea  HPI:  Relocated to [facility]  Diagnosed with sleep apnea about 5 years ago Has been using CPAP for about 3 years Feels well, sleeps well Excellent compliance with CPAP use  Usually goes to bed between 10 and 2 AM, usually between 10 and 12, final wake up time between 6 and 8 AM  Sleeps well  Sleep schedule sometimes gets off work, work-related changes   History of hypertension, weight gain recently  Dad with prostate cancer and diabetes  Never smoker  Has gained some weight recently related to relocating  Outpatient Encounter Medications as of 09/16/2019  Medication Sig  . Cholecalciferol (VITAMIN D3 PO) Take 5,000 Units by mouth daily.  Marland Kitchen losartan (COZAAR) 100 MG tablet Take 1 tablet (100 mg total) by mouth daily.  . magnesium gluconate (MAGONATE) 500 MG tablet Take 500 mg by mouth daily.  . Multiple Vitamins-Minerals (EMERGEN-C IMMUNE) PACK Take by mouth daily.  . Omega-3 Fatty Acids (FISH OIL) 1000 MG CAPS Take by mouth daily.  . [DISCONTINUED] clotrimazole-betamethasone (LOTRISONE) cream Apply 1 application topically 2 (two) times daily. (Patient not taking: Reported on 08/13/2019)   No facility-administered encounter medications on file as of 09/16/2019.    Allergies as of 09/16/2019  . (No Known Allergies)    Past Medical History:  Diagnosis Date  . Hypertension     No past surgical history on file.  Family History  Problem Relation Age of Onset  . Healthy Mother   . Prostate cancer Father   . Diabetes Father   . Hypertension Father   . Diabetes Brother   . Hypertension Brother   . Diabetes Maternal Grandfather     Social History   Socioeconomic History  . Marital status: Married     Spouse name: Not on file  . Number of children: Not on file  . Years of education: Not on file  . Highest education level: Not on file  Occupational History  . Not on file  Tobacco Use  . Smoking status: Never Smoker  . Smokeless tobacco: Never Used  Substance and Sexual Activity  . Alcohol use: Not on file    Comment: occasionally-beer  . Drug use: Yes    Frequency: 3.0 times per week    Types: Marijuana    Comment: per week  . Sexual activity: Not on file  Other Topics Concern  . Not on file  Social History Narrative  . Not on file   Social Determinants of Health   Financial Resource Strain:   . Difficulty of Paying Living Expenses:   Food Insecurity:   . Worried About Charity fundraiser in the Last Year:   . Arboriculturist in the Last Year:   Transportation Needs:   . Film/video editor (Medical):   Marland Kitchen Lack of Transportation (Non-Medical):   Physical Activity:   . Days of Exercise per Week:   . Minutes of Exercise per Session:   Stress:   . Feeling of Stress :   Social Connections:   . Frequency of Communication with Friends and Family:   . Frequency of Social Gatherings with Friends and Family:   . Attends Religious Services:   . Active  Member of Clubs or Organizations:   . Attends Archivist Meetings:   Marland Kitchen Marital Status:   Intimate Partner Violence:   . Fear of Current or Ex-Partner:   . Emotionally Abused:   Marland Kitchen Physically Abused:   . Sexually Abused:     Review of Systems  Constitutional: Negative for fatigue.  Respiratory: Positive for apnea. Negative for shortness of breath.   Psychiatric/Behavioral: Positive for sleep disturbance.    Vitals:   09/16/19 1522  BP: 138/82  Pulse: 86  Temp: 98.6 F (37 C)  SpO2: 98%     Physical Exam Constitutional:      Appearance: He is obese.  HENT:     Head: Normocephalic.     Mouth/Throat:     Mouth: Mucous membranes are moist.  Eyes:     General:        Right eye: No discharge.         Left eye: No discharge.  Cardiovascular:     Rate and Rhythm: Normal rate.     Heart sounds: No murmur heard.  No friction rub.  Pulmonary:     Effort: No respiratory distress.     Breath sounds: No stridor. No wheezing or rhonchi.  Musculoskeletal:     Cervical back: No rigidity or tenderness.  Neurological:     Mental Status: He is alert.  Psychiatric:        Mood and Affect: Mood normal.    Compliance data reveals 100% compliance, average hours of 7-1/2 hours AHI of 0.9 Some leaks  Results of the Epworth flowsheet 09/16/2019  Sitting and reading 1  Watching TV 2  Sitting, inactive in a public place (e.g. a theatre or a meeting) 0  As a passenger in a car for an hour without a break 0  Lying down to rest in the afternoon when circumstances permit 2  Sitting and talking to someone 0  Sitting quietly after a lunch without alcohol 1  In a car, while stopped for a few minutes in traffic 0  Total score 6     Assessment:  Obstructive sleep apnea Adequately treated with CPAP therapy  Plan/Recommendations: Continue CPAP use on a regular basis  We will follow-up in about a year  Call with significant concerns  Sherrilyn Rist MD [facility] 09/16/2019, 3:48 PM  CC: Horald Pollen, *

## 2019-09-17 DIAGNOSIS — M9903 Segmental and somatic dysfunction of lumbar region: Secondary | ICD-10-CM | POA: Diagnosis not present

## 2019-09-17 DIAGNOSIS — M9902 Segmental and somatic dysfunction of thoracic region: Secondary | ICD-10-CM | POA: Diagnosis not present

## 2019-09-17 DIAGNOSIS — M9905 Segmental and somatic dysfunction of pelvic region: Secondary | ICD-10-CM | POA: Diagnosis not present

## 2019-09-17 DIAGNOSIS — M9901 Segmental and somatic dysfunction of cervical region: Secondary | ICD-10-CM | POA: Diagnosis not present

## 2019-09-18 DIAGNOSIS — M9901 Segmental and somatic dysfunction of cervical region: Secondary | ICD-10-CM | POA: Diagnosis not present

## 2019-09-18 DIAGNOSIS — M9902 Segmental and somatic dysfunction of thoracic region: Secondary | ICD-10-CM | POA: Diagnosis not present

## 2019-09-18 DIAGNOSIS — M9903 Segmental and somatic dysfunction of lumbar region: Secondary | ICD-10-CM | POA: Diagnosis not present

## 2019-09-18 DIAGNOSIS — M9905 Segmental and somatic dysfunction of pelvic region: Secondary | ICD-10-CM | POA: Diagnosis not present

## 2019-09-20 DIAGNOSIS — M9905 Segmental and somatic dysfunction of pelvic region: Secondary | ICD-10-CM | POA: Diagnosis not present

## 2019-09-20 DIAGNOSIS — M9901 Segmental and somatic dysfunction of cervical region: Secondary | ICD-10-CM | POA: Diagnosis not present

## 2019-09-20 DIAGNOSIS — M9902 Segmental and somatic dysfunction of thoracic region: Secondary | ICD-10-CM | POA: Diagnosis not present

## 2019-09-20 DIAGNOSIS — M9903 Segmental and somatic dysfunction of lumbar region: Secondary | ICD-10-CM | POA: Diagnosis not present

## 2019-09-25 DIAGNOSIS — M9901 Segmental and somatic dysfunction of cervical region: Secondary | ICD-10-CM | POA: Diagnosis not present

## 2019-09-25 DIAGNOSIS — M9902 Segmental and somatic dysfunction of thoracic region: Secondary | ICD-10-CM | POA: Diagnosis not present

## 2019-09-25 DIAGNOSIS — M9903 Segmental and somatic dysfunction of lumbar region: Secondary | ICD-10-CM | POA: Diagnosis not present

## 2019-09-25 DIAGNOSIS — M9905 Segmental and somatic dysfunction of pelvic region: Secondary | ICD-10-CM | POA: Diagnosis not present

## 2019-09-26 DIAGNOSIS — M9902 Segmental and somatic dysfunction of thoracic region: Secondary | ICD-10-CM | POA: Diagnosis not present

## 2019-09-26 DIAGNOSIS — M47892 Other spondylosis, cervical region: Secondary | ICD-10-CM | POA: Diagnosis not present

## 2019-09-26 DIAGNOSIS — M9905 Segmental and somatic dysfunction of pelvic region: Secondary | ICD-10-CM | POA: Diagnosis not present

## 2019-09-26 DIAGNOSIS — M9903 Segmental and somatic dysfunction of lumbar region: Secondary | ICD-10-CM | POA: Diagnosis not present

## 2019-10-09 DIAGNOSIS — M9903 Segmental and somatic dysfunction of lumbar region: Secondary | ICD-10-CM | POA: Diagnosis not present

## 2019-10-09 DIAGNOSIS — M9902 Segmental and somatic dysfunction of thoracic region: Secondary | ICD-10-CM | POA: Diagnosis not present

## 2019-10-09 DIAGNOSIS — M47892 Other spondylosis, cervical region: Secondary | ICD-10-CM | POA: Diagnosis not present

## 2019-10-09 DIAGNOSIS — M9905 Segmental and somatic dysfunction of pelvic region: Secondary | ICD-10-CM | POA: Diagnosis not present

## 2019-10-16 DIAGNOSIS — M9901 Segmental and somatic dysfunction of cervical region: Secondary | ICD-10-CM | POA: Diagnosis not present

## 2019-10-16 DIAGNOSIS — M9902 Segmental and somatic dysfunction of thoracic region: Secondary | ICD-10-CM | POA: Diagnosis not present

## 2019-10-16 DIAGNOSIS — R293 Abnormal posture: Secondary | ICD-10-CM | POA: Diagnosis not present

## 2019-10-16 DIAGNOSIS — M9905 Segmental and somatic dysfunction of pelvic region: Secondary | ICD-10-CM | POA: Diagnosis not present

## 2019-10-16 DIAGNOSIS — M9903 Segmental and somatic dysfunction of lumbar region: Secondary | ICD-10-CM | POA: Diagnosis not present

## 2019-10-17 DIAGNOSIS — M47892 Other spondylosis, cervical region: Secondary | ICD-10-CM | POA: Diagnosis not present

## 2019-10-17 DIAGNOSIS — M9905 Segmental and somatic dysfunction of pelvic region: Secondary | ICD-10-CM | POA: Diagnosis not present

## 2019-10-17 DIAGNOSIS — M9902 Segmental and somatic dysfunction of thoracic region: Secondary | ICD-10-CM | POA: Diagnosis not present

## 2019-10-17 DIAGNOSIS — M9903 Segmental and somatic dysfunction of lumbar region: Secondary | ICD-10-CM | POA: Diagnosis not present

## 2019-10-22 DIAGNOSIS — M9902 Segmental and somatic dysfunction of thoracic region: Secondary | ICD-10-CM | POA: Diagnosis not present

## 2019-10-22 DIAGNOSIS — M9903 Segmental and somatic dysfunction of lumbar region: Secondary | ICD-10-CM | POA: Diagnosis not present

## 2019-10-22 DIAGNOSIS — M9905 Segmental and somatic dysfunction of pelvic region: Secondary | ICD-10-CM | POA: Diagnosis not present

## 2019-10-22 DIAGNOSIS — M9901 Segmental and somatic dysfunction of cervical region: Secondary | ICD-10-CM | POA: Diagnosis not present

## 2019-10-22 DIAGNOSIS — R293 Abnormal posture: Secondary | ICD-10-CM | POA: Diagnosis not present

## 2019-10-23 DIAGNOSIS — M9902 Segmental and somatic dysfunction of thoracic region: Secondary | ICD-10-CM | POA: Diagnosis not present

## 2019-10-23 DIAGNOSIS — R293 Abnormal posture: Secondary | ICD-10-CM | POA: Diagnosis not present

## 2019-10-23 DIAGNOSIS — M9903 Segmental and somatic dysfunction of lumbar region: Secondary | ICD-10-CM | POA: Diagnosis not present

## 2019-10-23 DIAGNOSIS — M9901 Segmental and somatic dysfunction of cervical region: Secondary | ICD-10-CM | POA: Diagnosis not present

## 2019-10-23 DIAGNOSIS — M9905 Segmental and somatic dysfunction of pelvic region: Secondary | ICD-10-CM | POA: Diagnosis not present

## 2019-10-28 DIAGNOSIS — M9901 Segmental and somatic dysfunction of cervical region: Secondary | ICD-10-CM | POA: Diagnosis not present

## 2019-10-28 DIAGNOSIS — R293 Abnormal posture: Secondary | ICD-10-CM | POA: Diagnosis not present

## 2019-10-28 DIAGNOSIS — M9902 Segmental and somatic dysfunction of thoracic region: Secondary | ICD-10-CM | POA: Diagnosis not present

## 2019-10-28 DIAGNOSIS — M9905 Segmental and somatic dysfunction of pelvic region: Secondary | ICD-10-CM | POA: Diagnosis not present

## 2019-10-28 DIAGNOSIS — M9903 Segmental and somatic dysfunction of lumbar region: Secondary | ICD-10-CM | POA: Diagnosis not present

## 2019-11-04 DIAGNOSIS — M9902 Segmental and somatic dysfunction of thoracic region: Secondary | ICD-10-CM | POA: Diagnosis not present

## 2019-11-04 DIAGNOSIS — M9901 Segmental and somatic dysfunction of cervical region: Secondary | ICD-10-CM | POA: Diagnosis not present

## 2019-11-04 DIAGNOSIS — M9905 Segmental and somatic dysfunction of pelvic region: Secondary | ICD-10-CM | POA: Diagnosis not present

## 2019-11-04 DIAGNOSIS — M9903 Segmental and somatic dysfunction of lumbar region: Secondary | ICD-10-CM | POA: Diagnosis not present

## 2019-11-04 DIAGNOSIS — R293 Abnormal posture: Secondary | ICD-10-CM | POA: Diagnosis not present

## 2019-11-06 DIAGNOSIS — M9902 Segmental and somatic dysfunction of thoracic region: Secondary | ICD-10-CM | POA: Diagnosis not present

## 2019-11-06 DIAGNOSIS — M47892 Other spondylosis, cervical region: Secondary | ICD-10-CM | POA: Diagnosis not present

## 2019-11-06 DIAGNOSIS — M9905 Segmental and somatic dysfunction of pelvic region: Secondary | ICD-10-CM | POA: Diagnosis not present

## 2019-11-06 DIAGNOSIS — M9903 Segmental and somatic dysfunction of lumbar region: Secondary | ICD-10-CM | POA: Diagnosis not present

## 2019-11-08 DIAGNOSIS — M9903 Segmental and somatic dysfunction of lumbar region: Secondary | ICD-10-CM | POA: Diagnosis not present

## 2019-11-08 DIAGNOSIS — M47892 Other spondylosis, cervical region: Secondary | ICD-10-CM | POA: Diagnosis not present

## 2019-11-08 DIAGNOSIS — M9902 Segmental and somatic dysfunction of thoracic region: Secondary | ICD-10-CM | POA: Diagnosis not present

## 2019-11-08 DIAGNOSIS — M9905 Segmental and somatic dysfunction of pelvic region: Secondary | ICD-10-CM | POA: Diagnosis not present

## 2019-11-12 DIAGNOSIS — M9905 Segmental and somatic dysfunction of pelvic region: Secondary | ICD-10-CM | POA: Diagnosis not present

## 2019-11-12 DIAGNOSIS — M9903 Segmental and somatic dysfunction of lumbar region: Secondary | ICD-10-CM | POA: Diagnosis not present

## 2019-11-12 DIAGNOSIS — M9902 Segmental and somatic dysfunction of thoracic region: Secondary | ICD-10-CM | POA: Diagnosis not present

## 2019-11-12 DIAGNOSIS — M47892 Other spondylosis, cervical region: Secondary | ICD-10-CM | POA: Diagnosis not present

## 2019-11-14 DIAGNOSIS — M9905 Segmental and somatic dysfunction of pelvic region: Secondary | ICD-10-CM | POA: Diagnosis not present

## 2019-11-14 DIAGNOSIS — M47892 Other spondylosis, cervical region: Secondary | ICD-10-CM | POA: Diagnosis not present

## 2019-11-14 DIAGNOSIS — M9903 Segmental and somatic dysfunction of lumbar region: Secondary | ICD-10-CM | POA: Diagnosis not present

## 2019-11-14 DIAGNOSIS — M9902 Segmental and somatic dysfunction of thoracic region: Secondary | ICD-10-CM | POA: Diagnosis not present

## 2019-11-19 DIAGNOSIS — M9905 Segmental and somatic dysfunction of pelvic region: Secondary | ICD-10-CM | POA: Diagnosis not present

## 2019-11-19 DIAGNOSIS — M9902 Segmental and somatic dysfunction of thoracic region: Secondary | ICD-10-CM | POA: Diagnosis not present

## 2019-11-19 DIAGNOSIS — M9903 Segmental and somatic dysfunction of lumbar region: Secondary | ICD-10-CM | POA: Diagnosis not present

## 2019-11-19 DIAGNOSIS — M47892 Other spondylosis, cervical region: Secondary | ICD-10-CM | POA: Diagnosis not present

## 2019-11-22 DIAGNOSIS — M9903 Segmental and somatic dysfunction of lumbar region: Secondary | ICD-10-CM | POA: Diagnosis not present

## 2019-11-22 DIAGNOSIS — M9905 Segmental and somatic dysfunction of pelvic region: Secondary | ICD-10-CM | POA: Diagnosis not present

## 2019-11-22 DIAGNOSIS — M47892 Other spondylosis, cervical region: Secondary | ICD-10-CM | POA: Diagnosis not present

## 2019-11-22 DIAGNOSIS — M9902 Segmental and somatic dysfunction of thoracic region: Secondary | ICD-10-CM | POA: Diagnosis not present

## 2019-11-26 DIAGNOSIS — M9905 Segmental and somatic dysfunction of pelvic region: Secondary | ICD-10-CM | POA: Diagnosis not present

## 2019-11-26 DIAGNOSIS — M9903 Segmental and somatic dysfunction of lumbar region: Secondary | ICD-10-CM | POA: Diagnosis not present

## 2019-11-26 DIAGNOSIS — M47892 Other spondylosis, cervical region: Secondary | ICD-10-CM | POA: Diagnosis not present

## 2019-11-26 DIAGNOSIS — M9902 Segmental and somatic dysfunction of thoracic region: Secondary | ICD-10-CM | POA: Diagnosis not present

## 2019-12-02 DIAGNOSIS — M9903 Segmental and somatic dysfunction of lumbar region: Secondary | ICD-10-CM | POA: Diagnosis not present

## 2019-12-02 DIAGNOSIS — M9902 Segmental and somatic dysfunction of thoracic region: Secondary | ICD-10-CM | POA: Diagnosis not present

## 2019-12-02 DIAGNOSIS — M47892 Other spondylosis, cervical region: Secondary | ICD-10-CM | POA: Diagnosis not present

## 2019-12-02 DIAGNOSIS — M9905 Segmental and somatic dysfunction of pelvic region: Secondary | ICD-10-CM | POA: Diagnosis not present

## 2019-12-05 DIAGNOSIS — M9905 Segmental and somatic dysfunction of pelvic region: Secondary | ICD-10-CM | POA: Diagnosis not present

## 2019-12-05 DIAGNOSIS — M47892 Other spondylosis, cervical region: Secondary | ICD-10-CM | POA: Diagnosis not present

## 2019-12-05 DIAGNOSIS — M9902 Segmental and somatic dysfunction of thoracic region: Secondary | ICD-10-CM | POA: Diagnosis not present

## 2019-12-05 DIAGNOSIS — M9903 Segmental and somatic dysfunction of lumbar region: Secondary | ICD-10-CM | POA: Diagnosis not present

## 2019-12-10 DIAGNOSIS — M9905 Segmental and somatic dysfunction of pelvic region: Secondary | ICD-10-CM | POA: Diagnosis not present

## 2019-12-10 DIAGNOSIS — M47892 Other spondylosis, cervical region: Secondary | ICD-10-CM | POA: Diagnosis not present

## 2019-12-10 DIAGNOSIS — M9902 Segmental and somatic dysfunction of thoracic region: Secondary | ICD-10-CM | POA: Diagnosis not present

## 2019-12-10 DIAGNOSIS — M9903 Segmental and somatic dysfunction of lumbar region: Secondary | ICD-10-CM | POA: Diagnosis not present

## 2019-12-13 DIAGNOSIS — M9903 Segmental and somatic dysfunction of lumbar region: Secondary | ICD-10-CM | POA: Diagnosis not present

## 2019-12-13 DIAGNOSIS — M9905 Segmental and somatic dysfunction of pelvic region: Secondary | ICD-10-CM | POA: Diagnosis not present

## 2019-12-13 DIAGNOSIS — M9902 Segmental and somatic dysfunction of thoracic region: Secondary | ICD-10-CM | POA: Diagnosis not present

## 2019-12-13 DIAGNOSIS — M47892 Other spondylosis, cervical region: Secondary | ICD-10-CM | POA: Diagnosis not present

## 2019-12-16 DIAGNOSIS — M9903 Segmental and somatic dysfunction of lumbar region: Secondary | ICD-10-CM | POA: Diagnosis not present

## 2019-12-16 DIAGNOSIS — M9902 Segmental and somatic dysfunction of thoracic region: Secondary | ICD-10-CM | POA: Diagnosis not present

## 2019-12-16 DIAGNOSIS — M9905 Segmental and somatic dysfunction of pelvic region: Secondary | ICD-10-CM | POA: Diagnosis not present

## 2019-12-16 DIAGNOSIS — M47892 Other spondylosis, cervical region: Secondary | ICD-10-CM | POA: Diagnosis not present

## 2019-12-19 DIAGNOSIS — M9905 Segmental and somatic dysfunction of pelvic region: Secondary | ICD-10-CM | POA: Diagnosis not present

## 2019-12-19 DIAGNOSIS — M9902 Segmental and somatic dysfunction of thoracic region: Secondary | ICD-10-CM | POA: Diagnosis not present

## 2019-12-19 DIAGNOSIS — M47892 Other spondylosis, cervical region: Secondary | ICD-10-CM | POA: Diagnosis not present

## 2019-12-19 DIAGNOSIS — M9903 Segmental and somatic dysfunction of lumbar region: Secondary | ICD-10-CM | POA: Diagnosis not present

## 2019-12-24 DIAGNOSIS — M47892 Other spondylosis, cervical region: Secondary | ICD-10-CM | POA: Diagnosis not present

## 2019-12-24 DIAGNOSIS — M9902 Segmental and somatic dysfunction of thoracic region: Secondary | ICD-10-CM | POA: Diagnosis not present

## 2019-12-24 DIAGNOSIS — M9905 Segmental and somatic dysfunction of pelvic region: Secondary | ICD-10-CM | POA: Diagnosis not present

## 2019-12-24 DIAGNOSIS — M9903 Segmental and somatic dysfunction of lumbar region: Secondary | ICD-10-CM | POA: Diagnosis not present

## 2019-12-27 DIAGNOSIS — M9905 Segmental and somatic dysfunction of pelvic region: Secondary | ICD-10-CM | POA: Diagnosis not present

## 2019-12-27 DIAGNOSIS — M9902 Segmental and somatic dysfunction of thoracic region: Secondary | ICD-10-CM | POA: Diagnosis not present

## 2019-12-27 DIAGNOSIS — M47892 Other spondylosis, cervical region: Secondary | ICD-10-CM | POA: Diagnosis not present

## 2019-12-27 DIAGNOSIS — M9903 Segmental and somatic dysfunction of lumbar region: Secondary | ICD-10-CM | POA: Diagnosis not present

## 2019-12-30 DIAGNOSIS — M9903 Segmental and somatic dysfunction of lumbar region: Secondary | ICD-10-CM | POA: Diagnosis not present

## 2019-12-30 DIAGNOSIS — M9905 Segmental and somatic dysfunction of pelvic region: Secondary | ICD-10-CM | POA: Diagnosis not present

## 2019-12-30 DIAGNOSIS — M47892 Other spondylosis, cervical region: Secondary | ICD-10-CM | POA: Diagnosis not present

## 2019-12-30 DIAGNOSIS — M9902 Segmental and somatic dysfunction of thoracic region: Secondary | ICD-10-CM | POA: Diagnosis not present

## 2020-01-01 DIAGNOSIS — M9902 Segmental and somatic dysfunction of thoracic region: Secondary | ICD-10-CM | POA: Diagnosis not present

## 2020-01-01 DIAGNOSIS — M47892 Other spondylosis, cervical region: Secondary | ICD-10-CM | POA: Diagnosis not present

## 2020-01-01 DIAGNOSIS — M9903 Segmental and somatic dysfunction of lumbar region: Secondary | ICD-10-CM | POA: Diagnosis not present

## 2020-01-01 DIAGNOSIS — M9905 Segmental and somatic dysfunction of pelvic region: Secondary | ICD-10-CM | POA: Diagnosis not present

## 2020-08-13 ENCOUNTER — Telehealth: Payer: Self-pay | Admitting: Emergency Medicine

## 2020-08-13 NOTE — Telephone Encounter (Signed)
1.Medication Requested: losartan (COZAAR) 100 MG tablet(Expired) 2. Pharmacy (Name, Street, [place]): [id number] - [place], [place] 3. On Med List: yes 4. Last Visit with PCP: 5.11.21 5. Next visit date with PCP: Originally on 5.16.22 due to provider being out of office it is now on 6.8.22  Agent: Please be advised that RX refills may take up to 3 business days. We ask that you follow-up with your pharmacy.

## 2020-08-14 ENCOUNTER — Other Ambulatory Visit: Payer: Self-pay

## 2020-08-14 DIAGNOSIS — I10 Essential (primary) hypertension: Secondary | ICD-10-CM

## 2020-08-14 MED ORDER — LOSARTAN POTASSIUM 100 MG PO TABS: 100.0000 mg | ORAL_TABLET | Freq: Every day | ORAL | 0 refills | Status: DC

## 2020-08-14 NOTE — Telephone Encounter (Signed)
Refilled losartsan 100mg  for 90 days.

## 2020-08-14 NOTE — Telephone Encounter (Signed)
Pt has an future OV 09/09/20.

## 2020-08-17 ENCOUNTER — Encounter: Payer: Self-pay | Admitting: Emergency Medicine

## 2020-09-09 ENCOUNTER — Encounter: Payer: BC Managed Care – PPO | Admitting: Emergency Medicine

## 2020-09-20 ENCOUNTER — Telehealth: Payer: BC Managed Care – PPO | Admitting: Family

## 2020-09-20 ENCOUNTER — Encounter (INDEPENDENT_AMBULATORY_CARE_PROVIDER_SITE_OTHER): Payer: Self-pay

## 2020-09-20 DIAGNOSIS — U071 COVID-19: Secondary | ICD-10-CM

## 2020-09-20 MED ORDER — BENZONATATE 100 MG PO CAPS: 100.0000 mg | ORAL_CAPSULE | Freq: Three times a day (TID) | ORAL | 0 refills | Status: DC | PRN

## 2020-09-20 MED ORDER — ALBUTEROL SULFATE HFA 108 (90 BASE) MCG/ACT IN AERS: 2.0000 | INHALATION_SPRAY | Freq: Four times a day (QID) | RESPIRATORY_TRACT | 0 refills | Status: DC | PRN

## 2020-09-20 MED ORDER — FLUTICASONE PROPIONATE 50 MCG/ACT NA SUSP: 2.0000 | Freq: Every day | NASAL | 6 refills | Status: DC

## 2020-09-20 NOTE — Progress Notes (Signed)
  E-Visit  for Positive Covid Test Result  We are sorry you are not feeling well. We are here to help!  You have tested positive for COVID-19, meaning that you were infected with the novel coronavirus and could give the virus to others.  It is vitally important that you stay home so you do not spread it to others.      Please continue isolation at home, for at least 10 days since the start of your symptoms and until you have had 24 hours with no fever (without taking a fever reducer) and with improving of symptoms.  If you have no symptoms but tested positive (or all symptoms resolve after 5 days and you have no fever) you can leave your house but continue to wear a mask around others for an additional 5 days. If you have a fever,continue to stay home until you have had 24 hours of no fever. Most cases improve 5-10 days from onset but we have seen a small number of patients who have gotten worse after the 10 days.  Please be sure to watch for worsening symptoms and remain taking the proper precautions.   Go to the nearest hospital ED for assessment if fever/cough/breathlessness are severe or illness seems like a threat to life.    The following symptoms may appear 2-14 days after exposure: Fever Cough Shortness of breath or difficulty breathing Chills Repeated shaking with chills Muscle pain Headache Sore throat New loss of taste or smell Fatigue Congestion or runny nose Nausea or vomiting Diarrhea  You have been enrolled in MyChart Home Monitoring for COVID-19. Daily you will receive a questionnaire within the MyChart website. Our COVID-19 response team will be monitoring your responses daily.  You can use medication such as prescription cough medication called Tessalon Perles 100 mg. You may take 1-2 capsules every 8 hours as needed for cough,  prescription inhaler called Albuterol MDI 90 mcg /actuation 2 puffs every 4 hours as needed for shortness of breath, wheezing, cough, and  prescription for Fluticasone nasal spray 2 sprays in each nostril one time per day  You may also take acetaminophen (Tylenol) as needed for fever.  HOME CARE: Only take medications as instructed by your medical team. Drink plenty of fluids and get plenty of rest. A steam or ultrasonic humidifier can help if you have congestion.   GET HELP RIGHT AWAY IF YOU HAVE EMERGENCY WARNING SIGNS.  Call 911 or proceed to your closest emergency facility if: You develop worsening high fever. Trouble breathing Bluish lips or face Persistent pain or pressure in the chest New confusion Inability to wake or stay awake You cough up blood. Your symptoms become more severe Inability to hold down food or fluids  This list is not all possible symptoms. Contact your medical provider for any symptoms that are severe or concerning to you.    Your e-visit answers were reviewed by a board certified advanced clinical practitioner to complete your personal care plan.  Depending on the condition, your plan could have included both over the counter or prescription medications.  If there is a problem please reply once you have received a response from your provider.  Your safety is important to us.  If you have drug allergies check your prescription carefully.    You can use MyChart to ask questions about today's visit, request a non-urgent call back, or ask for a work or school excuse for 24 hours related to this e-Visit. If it has   been greater than 24 hours you will need to follow up with your provider, or enter a new e-Visit to address those concerns. You will get an e-mail in the next two days asking about your experience.  I hope that your e-visit has been valuable and will speed your recovery. Thank you for using e-visits.    Approximately 5 minutes was spent documenting and reviewing patient's chart.    

## 2020-09-21 ENCOUNTER — Telehealth: Payer: Self-pay

## 2020-09-21 NOTE — Telephone Encounter (Signed)
Patient states that he has a cough and fatigue. Patient had a e-visit yesterday and was given some prescriptions. Patient was advise per protocol on cough and fatigue. Patient verbalized understanding and will continue to monitor to symptoms.    If appetite becomes worse: encourage patient to drink fluids as tolerated, work their way up to bland solid food such as crackers, pretzels, soup, bread or applesauce and boiled starches.   If patient is unable to tolerate any foods or liquids, notify PCP.   IF PATIENT DEVELOPS SEVERE VOMITING (MORE THAN 6 TIMES A DAY AND OR >8 HOURS) AND/OR SEVERE ABDOMINAL PAIN ADVISE PATIENT TO CALL 911 AND SEEK TREATMENT IN ED   Shortness of breath is the same: continue to monitor at home   If symptoms become severe, i.e. shortness of breath at rest, gasping for air, wheezing, CALL 911 AND SEEK TREATMENT IN THE ED   If cough remains the same or better: continue to treat with over the counter medications. Hard candy or cough drops and drinking warm fluids. Adults can also use honey 2 tsp (10 ML) at bedtime.   HONEY IS NOT RECOMMENDED FOR INFANTS UNDER ONE.   If cough is becoming worse even with the use of over the counter medications and patient is not able to sleep at night, cough becomes productive with sputum that maybe yellow or green in color, contact PCP.

## 2020-09-28 ENCOUNTER — Telehealth: Payer: BC Managed Care – PPO | Admitting: Physician Assistant

## 2020-09-28 ENCOUNTER — Encounter: Payer: Self-pay | Admitting: Physician Assistant

## 2020-09-28 DIAGNOSIS — H01001 Unspecified blepharitis right upper eyelid: Secondary | ICD-10-CM | POA: Diagnosis not present

## 2020-09-28 MED ORDER — ERYTHROMYCIN 5 MG/GM OP OINT
1.0000 | TOPICAL_OINTMENT | Freq: Every day | OPHTHALMIC | 0 refills | Status: DC
Start: 2020-09-28 — End: 2020-11-24

## 2020-09-28 NOTE — Progress Notes (Signed)
Brendan Gregory, Brendan Gregory are scheduled for a virtual visit with your provider today.    Just as we do with appointments in the office, we must obtain your consent to participate.  Your consent will be active for this visit and any virtual visit you may have with one of our providers in the next 365 days.    If you have a MyChart account, I can also send a copy of this consent to you electronically.  All virtual visits are billed to your insurance company just like a traditional visit in the office.  As this is a virtual visit, video technology does not allow for your provider to perform a traditional examination.  This may limit your provider's ability to fully assess your condition.  If your provider identifies any concerns that need to be evaluated in person or the need to arrange testing such as labs, EKG, etc, we will make arrangements to do so.    Although advances in technology are sophisticated, we cannot ensure that it will always work on either your end or our end.  If the connection with a video visit is poor, we may have to switch to a telephone visit.  With either a video or telephone visit, we are not always able to ensure that we have a secure connection.   I need to obtain your verbal consent now.   Are you willing to proceed with your visit today?   Brendan Gregory has provided verbal consent on 09/28/2020 for a virtual visit (video or telephone).   Brendan Daring, PA-C 09/28/2020  9:21 AM  Virtual Visit Consent   Brendan Gregory, you are scheduled for a virtual visit with a [facility] provider today.     Just as with appointments in the office, your consent must be obtained to participate.  Your consent will be active for this visit and any virtual visit you may have with one of our providers in the next 365 days.     If you have a MyChart account, a copy of this consent can be sent to you electronically.  All virtual visits are billed to your insurance company just like a traditional  visit in the office.    As this is a virtual visit, video technology does not allow for your provider to perform a traditional examination.  This may limit your provider's ability to fully assess your condition.  If your provider identifies any concerns that need to be evaluated in person or the need to arrange testing (such as labs, EKG, etc.), we will make arrangements to do so.     Although advances in technology are sophisticated, we cannot ensure that it will always work on either your end or our end.  If the connection with a video visit is poor, the visit may have to be switched to a telephone visit.  With either a video or telephone visit, we are not always able to ensure that we have a secure connection.     I need to obtain your verbal consent now.   Are you willing to proceed with your visit today?    Brendan Gregory has provided verbal consent on 09/28/2020 for a virtual visit (video or telephone).   Brendan Daring, PA-C   Date: 09/28/2020 9:21 AM   Virtual Visit via Video Note   Brendan Gregory, connected [email or web address]@ ([id number], May 07, 1980) on 09/28/20 at  9:15 AM EDT by a video-enabled telemedicine application and verified that I am speaking with the  correct person using two identifiers.  Location: Patient: Virtual Visit Location Patient: Home Provider: Virtual Visit Location Provider: Home Office   I discussed the limitations of evaluation and management by telemedicine and the availability of in person appointments. The patient expressed understanding and agreed to proceed.    History of Present Illness: Brendan Gregory is a 40 y.o. who identifies as a male who was assigned male at birth, and is being seen today for swollen eyelid.  HPI: Eye Problem  The right eye is affected. This is a new problem. The current episode started yesterday. The problem occurs constantly. The problem has been gradually worsening. There was no injury mechanism. The pain is mild. There is  No known exposure to pink eye. He Does not wear contacts. Associated symptoms include blurred vision, eye redness, a foreign body sensation and a recent URI. Pertinent negatives include no eye discharge, double vision, fever, itching, nausea, photophobia or vomiting. He has tried nothing for the symptoms. The treatment provided no relief.     Problems:  Patient Active Problem List   Diagnosis Date Noted   Obstructive sleep apnea on CPAP 05/16/2019   Essential hypertension 05/16/2019    Allergies: No Known Allergies Medications:  Current Outpatient Medications:    erythromycin ophthalmic ointment, Place 1 application into the right eye at bedtime., Disp: 3.5 g, Rfl: 0   albuterol (VENTOLIN HFA) 108 (90 Base) MCG/ACT inhaler, Inhale 2 puffs into the lungs every 6 (six) hours as needed for wheezing or shortness of breath., Disp: 8 g, Rfl: 0   benzonatate (TESSALON PERLES) 100 MG capsule, Take 1 capsule (100 mg total) by mouth 3 (three) times daily as needed., Disp: 20 capsule, Rfl: 0   Cholecalciferol (VITAMIN D3 PO), Take 5,000 Units by mouth daily., Disp: , Rfl:    fluticasone (FLONASE) 50 MCG/ACT nasal spray, Place 2 sprays into both nostrils daily., Disp: 16 g, Rfl: 6   losartan (COZAAR) 100 MG tablet, Take 1 tablet (100 mg total) by mouth daily., Disp: 90 tablet, Rfl: 0   magnesium gluconate (MAGONATE) 500 MG tablet, Take 500 mg by mouth daily., Disp: , Rfl:    Multiple Vitamins-Minerals (EMERGEN-C IMMUNE) PACK, Take by mouth daily., Disp: , Rfl:    Omega-3 Fatty Acids (FISH OIL) 1000 MG CAPS, Take by mouth daily., Disp: , Rfl:   Observations/Objective: Patient is well-developed, well-nourished in no acute distress.  Resting comfortably at home.  Head is normocephalic, atraumatic.  No labored breathing. Speech is clear and coherent with logical content.  Patient is alert and oriented at baseline.  Right upper eyelid is red and swollen  Assessment and Plan: 1. Blepharitis of right  upper eyelid, unspecified type - erythromycin ophthalmic ointment; Place 1 application into the right eye at bedtime.  Dispense: 3.5 g; Refill: 0 - Suspect blepharitis due to appearance on video call - Will treat with erythromycin ointment as noted above - May use warm compresses throughout the day - Tylenol or ibuprofen for pain - May wash eyelid/eyelashes with gentle shampoo such as Wynetta Emery and [employer] baby - Seek in person evaluation if symptoms worsen or fail to improve  Follow Up Instructions: I discussed the assessment and treatment plan with the patient. The patient was provided an opportunity to ask questions and all were answered. The patient agreed with the plan and demonstrated an understanding of the instructions.  A copy of instructions were sent to the patient via MyChart.  The patient was advised to call back or seek  an in-person evaluation if the symptoms worsen or if the condition fails to improve as anticipated.  Time:  I spent 13 minutes with the patient via telehealth technology discussing the above problems/concerns.    Brendan Daring, PA-C

## 2020-09-28 NOTE — Patient Instructions (Addendum)
[name], thank you for joining Mar Daring, PA-C for today's virtual visit.  While this provider is not your primary care provider (PCP), if your PCP is located in our provider database this encounter information will be shared with them immediately following your visit.  Consent: (Patient) Brendan Gregory provided verbal consent for this virtual visit at the beginning of the encounter.  Current Medications:  Current Outpatient Medications:    erythromycin ophthalmic ointment, Place 1 application into the right eye at bedtime., Disp: 3.5 g, Rfl: 0   albuterol (VENTOLIN HFA) 108 (90 Base) MCG/ACT inhaler, Inhale 2 puffs into the lungs every 6 (six) hours as needed for wheezing or shortness of breath., Disp: 8 g, Rfl: 0   benzonatate (TESSALON PERLES) 100 MG capsule, Take 1 capsule (100 mg total) by mouth 3 (three) times daily as needed., Disp: 20 capsule, Rfl: 0   Cholecalciferol (VITAMIN D3 PO), Take 5,000 Units by mouth daily., Disp: , Rfl:    fluticasone (FLONASE) 50 MCG/ACT nasal spray, Place 2 sprays into both nostrils daily., Disp: 16 g, Rfl: 6   losartan (COZAAR) 100 MG tablet, Take 1 tablet (100 mg total) by mouth daily., Disp: 90 tablet, Rfl: 0   magnesium gluconate (MAGONATE) 500 MG tablet, Take 500 mg by mouth daily., Disp: , Rfl:    Multiple Vitamins-Minerals (EMERGEN-C IMMUNE) PACK, Take by mouth daily., Disp: , Rfl:    Omega-3 Fatty Acids (FISH OIL) 1000 MG CAPS, Take by mouth daily., Disp: , Rfl:    Medications ordered in this encounter:  Meds ordered this encounter  Medications   erythromycin ophthalmic ointment    Sig: Place 1 application into the right eye at bedtime.    Dispense:  3.5 g    Refill:  0    Order Specific Question:   Supervising Provider    Answer:   Sabra Heck, BRIAN [[id number]]     *If you need refills on other medications prior to your next appointment, please contact your pharmacy*  Follow-Up: Call back or seek an in-person evaluation if the  symptoms worsen or if the condition fails to improve as anticipated.  If you have been instructed to have an in-person evaluation today at a local Urgent Care facility, please use the link below. It will take you to a list of all of our available [facility], including address, phone number and hours of operation. Please do not delay care.  [facility]  If you or a family member do not have a primary care provider, use the link below to schedule a visit and establish care. When you choose a [facility] primary care physician or advanced practice provider, you gain a long-term partner in health. Find a Primary Care Provider  Learn more about [facility] in-office and virtual care options: [facility]  Blepharitis Blepharitis is inflammation of the eyelids. Blepharitis may happen with: Reddish, scaly skin around the scalp and eyebrows. Burning or itching of the eyelids. Eye discharge at night that causes the eyelashes to stick together in the morning. Eyelashes that fall out. Sensitivity to light. Follow these instructions at home: Pay attention to any changes in how your eyes look or feel. Tell your health care provider about any changes. Follow these instructions to help with yourcondition. Keeping [employer] your hands often. Wash your eyelids with warm water or with warm water that is mixed with a small amount of baby shampoo. Do this two times  per day or as often as needed. Wash your face and eyebrows at least once a day. Use a clean towel each time you dry your eyelids. Do not use this towel to clean or dry other areas of your body. Do not share your towel with anyone.  General instructions Avoid wearing makeup until you get better. Do not share makeup with anyone. Avoid rubbing your eyes. Apply warm compresses to your eyes 2 times per day for 10 minutes at a time, or as told by your health care provider. If you were prescribed an  antibiotic ointment or steroid drops, apply or use the medicine as told by your health care provider. Do not stop using the medicine even if you feel better. Keep all follow-up visits as told by your health care provider. This is important. Contact a health care provider if: Your eyelids feel hot. You have blisters or a rash on your eyelids. The condition does not go away in 2-4 days. The inflammation gets worse. Get help right away if: You have pain or redness that gets worse or spreads to other parts of your face. Your vision changes. You have pain when looking at lights or moving objects. You have a fever. Summary Blepharitis is inflammation of the eyelids. Pay attention to any changes in how your eyes look or feel. Tell your health care provider about any changes. Follow home care instructions as told by your health care provider. Wash your hands often. Avoid wearing makeup. Do not rub your eyes. To treat this condition, use warm compresses and prescription ointments or eye drops. Let your health care provider know if you have vision changes, blisters or rash on eyelids, pain that spreads to your face, or warmth on your eyelids. This information is not intended to replace advice given to you by your health care provider. Make sure you discuss any questions you have with your healthcare provider. Document Revised: 09/11/2017 Document Reviewed: 09/11/2017 Elsevier Patient Education  [employer].

## 2020-09-29 ENCOUNTER — Encounter: Payer: BC Managed Care – PPO | Admitting: Emergency Medicine

## 2020-10-06 ENCOUNTER — Other Ambulatory Visit: Payer: Self-pay | Admitting: Family

## 2020-10-06 DIAGNOSIS — U071 COVID-19: Secondary | ICD-10-CM

## 2020-10-07 ENCOUNTER — Telehealth: Payer: BC Managed Care – PPO | Admitting: Nurse Practitioner

## 2020-10-07 ENCOUNTER — Telehealth: Payer: BC Managed Care – PPO | Admitting: Physician Assistant

## 2020-10-07 DIAGNOSIS — R053 Chronic cough: Secondary | ICD-10-CM

## 2020-10-07 DIAGNOSIS — U099 Post covid-19 condition, unspecified: Secondary | ICD-10-CM

## 2020-10-07 DIAGNOSIS — R059 Cough, unspecified: Secondary | ICD-10-CM

## 2020-10-07 MED ORDER — BENZONATATE 100 MG PO CAPS: 100.0000 mg | ORAL_CAPSULE | Freq: Three times a day (TID) | ORAL | 0 refills | Status: DC | PRN

## 2020-10-07 MED ORDER — HYDROXYZINE HCL 10 MG PO TABS: 10.0000 mg | ORAL_TABLET | Freq: Three times a day (TID) | ORAL | 0 refills | Status: DC | PRN

## 2020-10-07 NOTE — Progress Notes (Signed)
Virtual Visit Consent   Brendan Gregory, you are scheduled for a virtual visit with a [facility] provider today.     Just as with appointments in the office, your consent must be obtained to participate.  Your consent will be active for this visit and any virtual visit you may have with one of our providers in the next 365 days.     If you have a MyChart account, a copy of this consent can be sent to you electronically.  All virtual visits are billed to your insurance company just like a traditional visit in the office.    As this is a virtual visit, video technology does not allow for your provider to perform a traditional examination.  This may limit your provider's ability to fully assess your condition.  If your provider identifies any concerns that need to be evaluated in person or the need to arrange testing (such as labs, EKG, etc.), we will make arrangements to do so.     Although advances in technology are sophisticated, we cannot ensure that it will always work on either your end or our end.  If the connection with a video visit is poor, the visit may have to be switched to a telephone visit.  With either a video or telephone visit, we are not always able to ensure that we have a secure connection.     I need to obtain your verbal consent now.   Are you willing to proceed with your visit today?    Brendan Gregory has provided verbal consent on 10/07/2020 for a virtual visit (video or telephone).   Brendan Gregory, [place]   Date: 10/07/2020 9:02 AM   Virtual Visit via Video Note   I, Brendan Gregory, connected with  Brittney Caraway  ([id number], 12/30/1980) on 10/07/20 at  8:30 AM EDT by a video-enabled telemedicine application and verified that I am speaking with the correct person using two identifiers.  Location: Patient: Virtual Visit Location Patient: Home Provider: Virtual Visit Location Provider: Home Office   I discussed the limitations of evaluation and management by  telemedicine and the availability of in person appointments. The patient expressed understanding and agreed to proceed.    History of Present Illness: Brendan Gregory is a 40 y.o. who identifies as a male who was assigned male at birth, and is being seen today for discussion of post-covid symptoms. Patient was diagnosed with COVID 19 on 09/20/2020, with symptoms starting 09/19/2020. Endorses feeling much better now compared to when he had active infection. Is still dealing with a residual dry cough and some AM chest congestion. Denies fever, chills, chest pain. Had an e-visit earlier this morning for continued cough and was given a refill of Benzonatate for cough. States he is also experiencing some residual fatigue, especially as he is trying to resume his morning walking regimen. States he will note some occasional chest tightness which is alleviated with albuterol inhaler but the chest tightness makes him feel anxious. Has also noted nighttime anxiety about using his CPAP as it makes him feel like it is harder to get a good breathe. Notes this was not an issue before COVID.   HPI: HPI  Problems:  Patient Active Problem List   Diagnosis Date Noted   Obstructive sleep apnea on CPAP 05/16/2019   Essential hypertension 05/16/2019    Allergies: No Known Allergies Medications:  Current Outpatient Medications:    hydrOXYzine (ATARAX/VISTARIL) 10 MG tablet, Take 1 tablet (10 mg total) by  mouth 3 (three) times daily as needed for anxiety., Disp: 30 tablet, Rfl: 0   albuterol (VENTOLIN HFA) 108 (90 Base) MCG/ACT inhaler, Inhale 2 puffs into the lungs every 6 (six) hours as needed for wheezing or shortness of breath., Disp: 8 g, Rfl: 0   benzonatate (TESSALON PERLES) 100 MG capsule, Take 1 capsule (100 mg total) by mouth 3 (three) times daily as needed., Disp: 20 capsule, Rfl: 0   Cholecalciferol (VITAMIN D3 PO), Take 5,000 Units by mouth daily., Disp: , Rfl:    erythromycin ophthalmic ointment, Place 1  application into the right eye at bedtime., Disp: 3.5 g, Rfl: 0   fluticasone (FLONASE) 50 MCG/ACT nasal spray, Place 2 sprays into both nostrils daily., Disp: 16 g, Rfl: 6   losartan (COZAAR) 100 MG tablet, Take 1 tablet (100 mg total) by mouth daily., Disp: 90 tablet, Rfl: 0   magnesium gluconate (MAGONATE) 500 MG tablet, Take 500 mg by mouth daily., Disp: , Rfl:    Multiple Vitamins-Minerals (EMERGEN-C IMMUNE) PACK, Take by mouth daily., Disp: , Rfl:    Omega-3 Fatty Acids (FISH OIL) 1000 MG CAPS, Take by mouth daily., Disp: , Rfl:   Observations/Objective: Patient is well-developed, well-nourished in no acute distress.  Resting comfortably at home.  Head is normocephalic, atraumatic.  No labored breathing. Speech is clear and coherent with logical content.  Patient is alert and oriented at baseline.   Assessment and Plan: 1. Post-COVID-19 syndrome - hydrOXYzine (ATARAX/VISTARIL) 10 MG tablet; Take 1 tablet (10 mg total) by mouth 3 (three) times daily as needed for anxiety.  Dispense: 30 tablet; Refill: 0 Manifesting and some lingering post-infectious cough, fatigue, reactive airway and significant anxiety. No alarm signs/symptoms present at time of interview/video exam. He is speaking in complete sentences without any shortness of breath or wheezing. He is moving around throughout visit due to restlessness, further going along with new-onset anxiety. Will have him continue good hydration, rest, easing himself back into his walking regimen. He is to avoid use of albuterol unless needed as it can increase jitteriness. Resources given for mindfulness training. Will start trial of Atarax for anxiety, especially at nighttime, until he can get back in with his PCP for further evaluation and management of his post-covid symptoms.   Follow Up Instructions: I discussed the assessment and treatment plan with the patient. The patient was provided an opportunity to ask questions and all were answered.  The patient agreed with the plan and demonstrated an understanding of the instructions.  A copy of instructions were sent to the patient via MyChart.  The patient was advised to call back or seek an in-person evaluation if the symptoms worsen or if the condition fails to improve as anticipated.  Time:  I spent 20 minutes with the patient via telehealth technology discussing the above problems/concerns.    Brendan Rio, PA-C

## 2020-10-07 NOTE — Patient Instructions (Signed)
[name], thank you for joining [name], PA-C for today's virtual visit.  While this provider is not your primary care provider (PCP), if your PCP is located in our provider database this encounter information will be shared with them immediately following your visit.  Consent: (Patient) Brendan Gregory provided verbal consent for this virtual visit at the beginning of the encounter.  Current Medications:  Current Outpatient Medications:    albuterol (VENTOLIN HFA) 108 (90 Base) MCG/ACT inhaler, Inhale 2 puffs into the lungs every 6 (six) hours as needed for wheezing or shortness of breath., Disp: 8 g, Rfl: 0   benzonatate (TESSALON PERLES) 100 MG capsule, Take 1 capsule (100 mg total) by mouth 3 (three) times daily as needed., Disp: 20 capsule, Rfl: 0   Cholecalciferol (VITAMIN D3 PO), Take 5,000 Units by mouth daily., Disp: , Rfl:    erythromycin ophthalmic ointment, Place 1 application into the right eye at bedtime., Disp: 3.5 g, Rfl: 0   fluticasone (FLONASE) 50 MCG/ACT nasal spray, Place 2 sprays into both nostrils daily., Disp: 16 g, Rfl: 6   losartan (COZAAR) 100 MG tablet, Take 1 tablet (100 mg total) by mouth daily., Disp: 90 tablet, Rfl: 0   magnesium gluconate (MAGONATE) 500 MG tablet, Take 500 mg by mouth daily., Disp: , Rfl:    Multiple Vitamins-Minerals (EMERGEN-C IMMUNE) PACK, Take by mouth daily., Disp: , Rfl:    Omega-3 Fatty Acids (FISH OIL) 1000 MG CAPS, Take by mouth daily., Disp: , Rfl:    Medications ordered in this encounter:  No orders of the defined types were placed in this encounter.    *If you need refills on other medications prior to your next appointment, please contact your pharmacy*  Follow-Up: Call back or seek an in-person evaluation if the symptoms worsen or if the condition fails to improve as anticipated.  Other Instructions Please keep hydrated and get plenty of rest.  Dry mouth should improve as you are not on as many OTC  medications, etc. Sour candy and sugar-free gum can help with this. There is also a great product called Biotene that you can find in the dental hygiene aisle at the pharmacy or grocery store.   Try to avoid use of the albuterol unless you are noting true wheezing or windedness.  Ease yourself back into your walking regimen.   Take the hydroxyzine as directed, when needed for acute anxiety. I feel this will help with nighttime symptoms and once anxiety is calmed, your other symptoms will be less or even resolve completely.   I recommend downloading the HeadSpace app on your phone to work on mindfulness exercises for anxiety.   Please schedule a follow-up with your PCP for further assessment and ongoing management of current symptoms.   If you have been instructed to have an in-person evaluation today at a local Urgent Care facility, please use the link below. It will take you to a list of all of our available [facility], including address, phone number and hours of operation. Please do not delay care.  [facility]  If you or a family member do not have a primary care provider, use the link below to schedule a visit and establish care. When you choose a [facility] primary care physician or advanced practice provider, you gain a long-term partner in health. Find a Primary Care Provider  Learn more about 's in-office and virtual care options: [facility]

## 2020-10-07 NOTE — Progress Notes (Signed)
We are sorry that you are not feeling well.  Here is how we plan to help!  Based on your presentation I believe you most likely have A cough due to a virus.  This is called viral bronchitis and is best treated by rest, plenty of fluids and control of the cough.  You may use Ibuprofen or Tylenol as directed to help your symptoms.     In addition you may use A prescription cough medication called Tessalon Perles 100mg . You may take 1-2 capsules every 8 hours as needed for your cough.  I see that you had positive covid in June. The cough can last seeral weeks. The tessalon perles should help.  From your responses in the eVisit questionnaire you describe inflammation in the upper respiratory tract which is causing a significant cough.  This is commonly called Bronchitis and has four common causes:   Allergies Viral Infections Acid Reflux Bacterial Infection Allergies, viruses and acid reflux are treated by controlling symptoms or eliminating the cause. An example might be a cough caused by taking certain blood pressure medications. You stop the cough by changing the medication. Another example might be a cough caused by acid reflux. Controlling the reflux helps control the cough.  USE OF BRONCHODILATOR ("RESCUE") INHALERS: There is a risk from using your bronchodilator too frequently.  The risk is that over-reliance on a medication which only relaxes the muscles surrounding the breathing tubes can reduce the effectiveness of medications prescribed to reduce swelling and congestion of the tubes themselves.  Although you feel brief relief from the bronchodilator inhaler, your asthma may actually be worsening with the tubes becoming more swollen and filled with mucus.  This can delay other crucial treatments, such as oral steroid medications. If you need to use a bronchodilator inhaler daily, several times per day, you should discuss this with your provider.  There are probably better treatments that could  be used to keep your asthma under control.     HOME CARE Only take medications as instructed by your medical team. Complete the entire course of an antibiotic. Drink plenty of fluids and get plenty of rest. Avoid close contacts especially the very young and the elderly Cover your mouth if you cough or cough into your sleeve. Always remember to wash your hands A steam or ultrasonic humidifier can help congestion.   GET HELP RIGHT AWAY IF: You develop worsening fever. You become short of breath You cough up blood. Your symptoms persist after you have completed your treatment plan MAKE SURE YOU  Understand these instructions. Will watch your condition. Will get help right away if you are not doing well or get worse.    Thank you for choosing an e-visit.  Your e-visit answers were reviewed by a board certified advanced clinical practitioner to complete your personal care plan. Depending upon the condition, your plan could have included both over the counter or prescription medications.  Please review your pharmacy choice. Make sure the pharmacy is open so you can pick up prescription now. If there is a problem, you may contact your provider through [employer] and have the prescription routed to another pharmacy.  Your safety is important to [place]. If you have drug allergies check your prescription carefully.   For the next 24 hours you can use MyChart to ask questions about today's visit, request a non-urgent call back, or ask for a work or school excuse. You will get an email in the next two days asking about  your experience. I hope that your e-visit has been valuable and will speed your recovery.  5-10 minutes spent reviewing and documenting in chart.

## 2020-10-09 ENCOUNTER — Encounter: Payer: Self-pay | Admitting: Family Medicine

## 2020-10-09 ENCOUNTER — Telehealth: Payer: BC Managed Care – PPO | Admitting: Family Medicine

## 2020-10-09 DIAGNOSIS — F419 Anxiety disorder, unspecified: Secondary | ICD-10-CM | POA: Diagnosis not present

## 2020-10-09 DIAGNOSIS — U099 Post covid-19 condition, unspecified: Secondary | ICD-10-CM | POA: Diagnosis not present

## 2020-10-09 NOTE — Patient Instructions (Signed)
Please seek face to face care if symptoms do not improve with the second dose of the medication.  Please keep hydrated and get plenty of rest. Dry mouth should improve as you are not on as many OTC medications, etc. Sour candy and sugar-free gum can help with this. There is also a great product called Biotene that you can find in the dental hygiene aisle at the pharmacy or grocery store.   Try to avoid use of the albuterol unless you are noting true wheezing or windedness.   Ease yourself back into your walking regimen.   Take the hydroxyzine as directed, when needed for acute anxiety. I feel this will help with nighttime symptoms and once anxiety is calmed, your other symptoms will be less or even resolve completely.   I recommend downloading the HeadSpace app on your phone to work on mindfulness exercises for anxiety.   Please schedule a follow-up with your PCP for further assessment and ongoing management of current symptoms.     If you have been instructed to have an in-person evaluation today at a local Urgent Care facility, please use the link below. It will take you to a list of all of our available [facility], including address, phone number and hours of operation. Please do not delay care. [facility]   If you or a family member do not have a primary care provider, use the link below to schedule a visit and establish care. When you choose a [facility] primary care physician or advanced practice provider, you gain a long-term partner in health. Find a Primary Care Provider   Learn more about [facility] in-office and virtual care options: [facility]

## 2020-10-09 NOTE — Progress Notes (Signed)
[name], yeargan are scheduled for a virtual visit with your provider today.    Just as we do with appointments in the office, we must obtain your consent to participate.  Your consent will be active for this visit and any virtual visit you may have with one of our providers in the next 365 days.    If you have a MyChart account, I can also send a copy of this consent to you electronically.  All virtual visits are billed to your insurance company just like a traditional visit in the office.  As this is a virtual visit, video technology does not allow for your provider to perform a traditional examination.  This may limit your provider's ability to fully assess your condition.  If your provider identifies any concerns that need to be evaluated in person or the need to arrange testing such as labs, EKG, etc, we will make arrangements to do so.    Although advances in technology are sophisticated, we cannot ensure that it will always work on either your end or our end.  If the connection with a video visit is poor, we may have to switch to a telephone visit.  With either a video or telephone visit, we are not always able to ensure that we have a secure connection.   I need to obtain your verbal consent now.   Are you willing to proceed with your visit today?   Shlomo Seres has provided verbal consent on 10/09/2020 for a virtual visit (video or telephone).   Perlie Mayo, NP 10/09/2020  12:38 PM   Date:  10/09/2020   ID:  Brendan Gregory, DOB 1980/12/30, MRN [id number]  Patient Location: Home Provider Location: Home Office   Participants: Patient and Provider for Visit and Wrap up  Method of visit: Video  Location of Patient: Home Location of Provider: Home Office Consent was obtain for visit over the video. Services rendered by provider: Visit was performed via video  A video enabled telemedicine application was used and I verified that I am speaking with the correct person using two  identifiers.  PCP:  Horald Pollen, MD   Chief Complaint:  jitters and high HR   History of Present Illness:    Brendan Gregory is a 40 y.o. male with history as stated below. Presents video telehealth for an acute care visit secondary to feeling jitters and noticing a elevated HR on his watch. He reports a HR range of 97-110. Some mild dizziness (though he was just seen in our virtual clinic by [place], [place] on 10/07/20 for post covid syndrome. He was provided with hydroxyzine 10mg  TID for anxiety he was feeling.  Today he reports after a dose of this he does not feel much better, in fact maybe worse. Was not sure if it was a reaction to the medication or if it was just anxiety still. He reports Hx of enlarged heart and HTN. He reports taking his medication as directed. Was doing ok prior to [place].  Has not be using his inhaler albuertol inhaler to help prevent jitters.  He reports he feels better than he did with COVID, but does not feel like himself and energy is low. He has mild shortness of breath moving around, but HR is up so he is unsure if it is from that. Denies fever, chills, wheezing, chest pain, or other COVID S&S.     Past Medical, Surgical, Social History, Allergies, and Medications have been Reviewed.  Past Medical History:  Diagnosis Date   Hypertension     No outpatient medications have been marked as taking for the 10/09/20 encounter (Appointment) with Perlie Mayo, NP.     Allergies:   Patient has no known allergies.   ROS See HPI for history of present illness.  Observations/Objective: Patient is well-developed, well-nourished in no acute distress. Resting comfortably at home. Head is normocephalic, atraumatic. No labored breathing. Speech is clear and coherent with logical content. Patient is alert and oriented at baseline.  A&P  1. Post-COVID-19 syndrome Concern for anxiety related to post covid syndrome. No ref flags, however he reports he feels  worse than he did on 10/07/20 visit. Hydroxyzine did not improve symptoms for long- he was advised to repeat the dose while on the visit and did so while on video.  He is encouraged to seek face to face care if the repeat dose does not improve his symptoms. He is encouraged to work on anxiety care as discussed in visit today and at previous visit.  He is encouraged to avoid albuterol unless needed. He is also encouraged to rest and hydrate.  2. Anxiety He is encouraged to work on anxiety care as discussed in visit today and at previous visit.  He is encouraged to avoid albuterol unless needed. He is also encouraged to rest and hydrate.    Time:   Today, I have spent 20 minutes with the patient with telehealth technology discussing the above problems, reviewing the chart, previous notes, medications and orders.   Medication Changes: No orders of the defined types were placed in this encounter.    Disposition:  Follow up PRN  Signed, Perlie Mayo, NP  10/09/2020 12:38 PM

## 2020-10-21 DIAGNOSIS — G4733 Obstructive sleep apnea (adult) (pediatric): Secondary | ICD-10-CM | POA: Diagnosis not present

## 2020-11-13 ENCOUNTER — Other Ambulatory Visit: Payer: Self-pay | Admitting: Emergency Medicine

## 2020-11-13 DIAGNOSIS — I10 Essential (primary) hypertension: Secondary | ICD-10-CM

## 2020-11-24 ENCOUNTER — Other Ambulatory Visit: Payer: Self-pay

## 2020-11-24 ENCOUNTER — Ambulatory Visit (INDEPENDENT_AMBULATORY_CARE_PROVIDER_SITE_OTHER): Payer: BC Managed Care – PPO | Admitting: Emergency Medicine

## 2020-11-24 ENCOUNTER — Encounter: Payer: Self-pay | Admitting: Emergency Medicine

## 2020-11-24 VITALS — BP 132/80 | HR 80 | Ht 71.0 in | Wt 309.0 lb

## 2020-11-24 DIAGNOSIS — Z0000 Encounter for general adult medical examination without abnormal findings: Secondary | ICD-10-CM | POA: Diagnosis not present

## 2020-11-24 DIAGNOSIS — R079 Chest pain, unspecified: Secondary | ICD-10-CM

## 2020-11-24 DIAGNOSIS — Z130 Encounter for screening for diseases of the blood and blood-forming organs and certain disorders involving the immune mechanism: Secondary | ICD-10-CM

## 2020-11-24 DIAGNOSIS — Z1329 Encounter for screening for other suspected endocrine disorder: Secondary | ICD-10-CM | POA: Diagnosis not present

## 2020-11-24 DIAGNOSIS — Z13220 Encounter for screening for lipoid disorders: Secondary | ICD-10-CM | POA: Diagnosis not present

## 2020-11-24 DIAGNOSIS — I10 Essential (primary) hypertension: Secondary | ICD-10-CM

## 2020-11-24 DIAGNOSIS — Z9989 Dependence on other enabling machines and devices: Secondary | ICD-10-CM

## 2020-11-24 DIAGNOSIS — G4733 Obstructive sleep apnea (adult) (pediatric): Secondary | ICD-10-CM

## 2020-11-24 DIAGNOSIS — Z13228 Encounter for screening for other metabolic disorders: Secondary | ICD-10-CM

## 2020-11-24 LAB — COMPREHENSIVE METABOLIC PANEL
ALT: 30 U/L (ref 0–53)
AST: 25 U/L (ref 0–37)
Albumin: 4.3 g/dL (ref 3.5–5.2)
Alkaline Phosphatase: 78 U/L (ref 39–117)
BUN: 16 mg/dL (ref 6–23)
CO2: 29 mEq/L (ref 19–32)
Calcium: 9.6 mg/dL (ref 8.4–10.5)
Chloride: 101 mEq/L (ref 96–112)
Creatinine, Ser: 0.96 mg/dL (ref 0.40–1.50)
GFR: 100.05 mL/min (ref >60.00)
Glucose, Bld: 84 mg/dL (ref 70–99)
Potassium: 3.8 mEq/L (ref 3.5–5.1)
Sodium: 136 mEq/L (ref 135–145)
Total Bilirubin: 0.6 mg/dL (ref 0.2–1.2)
Total Protein: 7.7 g/dL (ref 6.0–8.3)

## 2020-11-24 LAB — CBC WITH DIFFERENTIAL/PLATELET
Basophils Absolute: 0.1 10*3/uL (ref 0.0–0.1)
Basophils Relative: 0.6 % (ref 0.0–3.0)
Eosinophils Absolute: 0.3 10*3/uL (ref 0.0–0.7)
Eosinophils Relative: 3.1 % (ref 0.0–5.0)
HCT: 40.8 % (ref 39.0–52.0)
Hemoglobin: 13.9 g/dL (ref 13.0–17.0)
Lymphocytes Relative: 30.6 % (ref 12.0–46.0)
Lymphs Abs: 3 10*3/uL (ref 0.7–4.0)
MCHC: 34 g/dL (ref 30.0–36.0)
MCV: 89.1 fl (ref 78.0–100.0)
Monocytes Absolute: 0.7 10*3/uL (ref 0.1–1.0)
Monocytes Relative: 7 % (ref 3.0–12.0)
Neutro Abs: 5.7 10*3/uL (ref 1.4–7.7)
Neutrophils Relative %: 58.7 % (ref 43.0–77.0)
Platelets: 294 10*3/uL (ref 150.0–400.0)
RBC: 4.58 Mil/uL (ref 4.22–5.81)
RDW: 12.9 % (ref 11.5–15.5)
WBC: 9.7 10*3/uL (ref 4.0–10.5)

## 2020-11-24 LAB — LIPID PANEL
Cholesterol: 134 mg/dL (ref 0–200)
HDL: 31 mg/dL — ABNORMAL LOW (ref >39.00)
LDL Cholesterol: 71 mg/dL (ref 0–99)
NonHDL: 102.75
Total CHOL/HDL Ratio: 4
Triglycerides: 161 mg/dL — ABNORMAL HIGH (ref 0.0–149.0)
VLDL: 32.2 mg/dL (ref 0.0–40.0)

## 2020-11-24 LAB — HEMOGLOBIN A1C: Hgb A1c MFr Bld: 5.5 % (ref 4.6–6.5)

## 2020-11-24 NOTE — Patient Instructions (Signed)
Health Maintenance, Male Adopting a healthy lifestyle and getting preventive care are important in promoting health and wellness. Ask your health care provider about: The right schedule for you to have regular tests and exams. Things you can do on your own to prevent diseases and keep yourself healthy. What should I know about diet, weight, and exercise? Eat a healthy diet  Eat a diet that includes plenty of vegetables, fruits, low-fat dairy products, and lean protein. Do not eat a lot of foods that are high in solid fats, added sugars, or sodium.  Maintain a healthy weight Body mass index (BMI) is a measurement that can be used to identify possible weight problems. It estimates body fat based on height and weight. Your health care provider can help determine your BMI and help you achieve or maintain ahealthy weight. Get regular exercise Get regular exercise. This is one of the most important things you can do for your health. Most adults should: Exercise for at least 150 minutes each week. The exercise should increase your heart rate and make you sweat (moderate-intensity exercise). Do strengthening exercises at least twice a week. This is in addition to the moderate-intensity exercise. Spend less time sitting. Even light physical activity can be beneficial. Watch cholesterol and blood lipids Have your blood tested for lipids and cholesterol at 39 years of age, then havethis test every 5 years. You may need to have your cholesterol levels checked more often if: Your lipid or cholesterol levels are high. You are older than 40 years of age. You are at high risk for heart disease. What should I know about cancer screening? Many types of cancers can be detected early and may often be prevented. Depending on your health history and family history, you may need to have cancer screening at various ages. This may include screening for: Colorectal cancer. Prostate cancer. Skin cancer. Lung  cancer. What should I know about heart disease, diabetes, and high blood pressure? Blood pressure and heart disease High blood pressure causes heart disease and increases the risk of stroke. This is more likely to develop in people who have high blood pressure readings, are of African descent, or are overweight. Talk with your health care provider about your target blood pressure readings. Have your blood pressure checked: Every 3-5 years if you are 18-39 years of age. Every year if you are 40 years old or older. If you are between the ages of 65 and 75 and are a current or former smoker, ask your health care provider if you should have a one-time screening for abdominal aortic aneurysm (AAA). Diabetes Have regular diabetes screenings. This checks your fasting blood sugar level. Have the screening done: Once every three years after age 45 if you are at a normal weight and have a low risk for diabetes. More often and at a younger age if you are overweight or have a high risk for diabetes. What should I know about preventing infection? Hepatitis B If you have a higher risk for hepatitis B, you should be screened for this virus. Talk with your health care provider to find out if you are at risk forhepatitis B infection. Hepatitis C Blood testing is recommended for: Everyone born from 1945 through 1965. Anyone with known risk factors for hepatitis C. Sexually transmitted infections (STIs) You should be screened each year for STIs, including gonorrhea and chlamydia, if: You are sexually active and are younger than 39 years of age. You are older than 39 years of age   and your health care provider tells you that you are at risk for this type of infection. Your sexual activity has changed since you were last screened, and you are at increased risk for chlamydia or gonorrhea. Ask your health care provider if you are at risk. Ask your health care provider about whether you are at high risk for HIV.  Your health care provider may recommend a prescription medicine to help prevent HIV infection. If you choose to take medicine to prevent HIV, you should first get tested for HIV. You should then be tested every 3 months for as long as you are taking the medicine. Follow these instructions at home: Lifestyle Do not use any products that contain nicotine or tobacco, such as cigarettes, e-cigarettes, and chewing tobacco. If you need help quitting, ask your health care provider. Do not use street drugs. Do not share needles. Ask your health care provider for help if you need support or information about quitting drugs. Alcohol use Do not drink alcohol if your health care provider tells you not to drink. If you drink alcohol: Limit how much you have to 0-2 drinks a day. Be aware of how much alcohol is in your drink. In the [place] one drink equals one 12 oz bottle of beer (355 mL), one 5 oz glass of wine (148 mL), or one 1 oz glass of hard liquor (44 mL). General instructions Schedule regular health, dental, and eye exams. Stay current with your vaccines. Tell your health care provider if: You often feel depressed. You have ever been abused or do not feel safe at home. Summary Adopting a healthy lifestyle and getting preventive care are important in promoting health and wellness. Follow your health care provider's instructions about healthy diet, exercising, and getting tested or screened for diseases. Follow your health care provider's instructions on monitoring your cholesterol and blood pressure. This information is not intended to replace advice given to you by your health care provider. Make sure you discuss any questions you have with your healthcare provider. Document Revised: 03/14/2018 Document Reviewed: 03/14/2018 Elsevier Patient Education  2022 [employer]  

## 2020-11-24 NOTE — Progress Notes (Signed)
Brendan Gregory 40 y.o.   Chief Complaint  Patient presents with   Annual Exam    Anxiety and elevated BP    HISTORY OF PRESENT ILLNESS: This is Gregory 40 y.o. male here for annual exam. Has the following problems: 1.  Hypertension: On losartan 100 mg daily. 2.  Recent COVID infection that started on September 19, 2020.  Recovered well but may still be dealing with post-COVID issues 3.  Nonspecific intermittent left-sided chest pain with at times "heart beating hard".  Had to call 911 evening when his blood pressure was very elevated, 181/108.  Since blood pressure average has been 120s over 70s.  EKG Gregory at the scene within normal limits.  Patient brought copies of twelve-lead EKG with him.  Reviewed, look normal.  Not transported to emergency department. 4.  General feeling of anxiety.  Had virtual visit and hydroxyzine was prescribed.  Working well. 5.  About 4 years ago up in the [place] area patient was told by his doctor his heart was enlarged. 6.  Morbid obesity.  Prior to [place] was exercising more, eating better, and losing weight.   HPI   Prior to Admission medications   Medication Sig Start Date End Date Taking? Authorizing Provider  Cholecalciferol (VITAMIN D3 PO) Take 5,000 Units by mouth daily.   Yes [provider]  losartan (COZAAR) 100 MG tablet TAKE 1 TABLET BY MOUTH EVERY DAY 11/13/20  Yes Brendan Gregory, Brendan Bloomer, MD  magnesium gluconate (MAGONATE) 500 MG tablet Take 500 mg by mouth daily.   Yes [provider]  Omega-3 Fatty Acids (FISH OIL) 1000 MG CAPS Take by mouth daily.   Yes [provider]  albuterol (VENTOLIN HFA) 108 (90 Base) MCG/ACT inhaler Inhale 2 puffs into the lungs every 6 (six) hours as needed for wheezing or shortness of breath. 09/20/20   Brendan Balloon, Brendan Gregory  benzonatate (TESSALON PERLES) 100 MG capsule Take 1 capsule (100 mg total) by mouth 3 (three) times daily as needed. 10/07/20   Brendan Gregory Mary-Margaret, Brendan Gregory  erythromycin ophthalmic  ointment Place 1 application into the right eye at bedtime. 09/28/20   Brendan Daring, Brendan Gregory  fluticasone (FLONASE) 50 MCG/ACT nasal spray Place 2 sprays into both nostrils daily. 09/20/20   Brendan Dun Gregory, Brendan Gregory  hydrOXYzine (ATARAX/VISTARIL) 10 MG tablet Take 1 tablet (10 mg total) by mouth 3 (three) times daily as needed for anxiety. 10/07/20   Brendan Jeans, Brendan Gregory  Multiple Vitamins-Minerals (EMERGEN-C IMMUNE) PACK Take by mouth daily.    [provider]    No Known Allergies  Patient Active Problem List   Diagnosis Date Noted   Obstructive sleep apnea on CPAP 05/16/2019   Essential hypertension 05/16/2019    Past Medical History:  Diagnosis Date   Hypertension     No past surgical history on file.  Social History   Socioeconomic History   Marital status: Married    Spouse name: Not on file   Number of children: Not on file   Years of education: Not on file   Highest education level: Not on file  Occupational History   Not on file  Tobacco Use   Smoking status: Never   Smokeless tobacco: Never  Substance and Sexual Activity   Alcohol use: Not on file    Comment: occasionally-beer   Drug use: Yes    Frequency: 3.0 times per week    Types: Marijuana    Comment: per week   Sexual activity: Not on file  Other Topics Concern   Not on file  Social History Narrative   Not on file   Social Determinants of Health   Financial Resource Strain: Not on file  Food Insecurity: Not on file  Transportation Needs: Not on file  Physical Activity: Not on file  Stress: Not on file  Social Connections: Not on file  Intimate Partner Violence: Not on file    Family History  Problem Relation Age of Onset   Healthy Mother    Prostate cancer Father    Diabetes Father    Hypertension Father    Diabetes Brother    Hypertension Brother    Diabetes Maternal Grandfather      Review of Systems  Constitutional: Negative.  Negative for chills and fever.  HENT:  Negative.  Negative for congestion and sore throat.   Respiratory: Negative.  Negative for cough and shortness of breath.   Cardiovascular: Negative.  Negative for chest pain and palpitations.  Gastrointestinal:  Negative for abdominal pain, diarrhea, nausea and vomiting.  Genitourinary: Negative.  Negative for dysuria and hematuria.  Skin: Negative.  Negative for rash.  Neurological: Negative.  Negative for dizziness and headaches.  All other systems reviewed and are negative.   Physical Exam Vitals reviewed.  Constitutional:      Appearance: He is obese.  HENT:     Head: Normocephalic.     Right Ear: Tympanic membrane, ear canal and external ear normal.     Left Ear: Tympanic membrane, ear canal and external ear normal.     Mouth/Throat:     Mouth: Mucous membranes are moist.     Pharynx: Oropharynx is clear.  Eyes:     Extraocular Movements: Extraocular movements intact.     Conjunctiva/sclera: Conjunctivae normal.     Pupils: Pupils are equal, round, and reactive to light.  Cardiovascular:     Rate and Rhythm: Normal rate and regular rhythm.     Heart sounds: Normal heart sounds.  Pulmonary:     Effort: Pulmonary effort is normal.     Breath sounds: Normal breath sounds.  Abdominal:     General: Bowel sounds are normal. There is no distension.     Palpations: Abdomen is soft.     Tenderness: There is no abdominal tenderness.  Musculoskeletal:        General: Normal range of motion.     Cervical back: Normal range of motion and neck supple. No tenderness.  Lymphadenopathy:     Cervical: No cervical adenopathy.  Skin:    General: Skin is warm and dry.  Neurological:     General: No focal deficit present.     Mental Status: He is alert and oriented to person, place, and time.  Psychiatric:        Mood and Affect: Mood normal.        Behavior: Behavior normal.     ASSESSMENT & PLAN: [name] was seen today for annual exam.  Diagnoses and all orders for this  visit:  Routine general medical examination at Gregory health care facility  Screening for deficiency anemia -     CBC with Differential  Screening for lipoid disorders -     Lipid panel  Screening for endocrine, metabolic and immunity disorder -     Comprehensive metabolic panel -     Hemoglobin A1c  Nonspecific chest pain -     Ambulatory referral to Cardiology  Essential hypertension -     Ambulatory referral to Cardiology  Obstructive  sleep apnea on CPAP  Morbid obesity (HCC)  Modifiable risk factors discussed with patient. Anticipatory guidance according to age provided. The following topics were also discussed: Social Determinants of Health Smoking advised not needed Diet and nutrition and need to decrease amount of daily carbohydrate intake Benefits of exercise Vaccinations recommendations Cardiovascular risk assessment Need for cardiology evaluation Mental health including depression and anxiety Fall and accident prevention  Patient Instructions  Health Maintenance, Male Adopting Gregory healthy lifestyle and getting preventive care are important in promoting health and wellness. Ask your health care provider about: The right schedule for you to have regular tests and exams. Things you can do on your own to prevent diseases and keep yourself healthy. What should I know about diet, weight, and exercise? Eat Gregory healthy diet  Eat Gregory diet that includes plenty of vegetables, fruits, low-fat dairy products, and lean protein. Do not eat Gregory lot of foods that are high in solid fats, added sugars, or sodium.  Maintain Gregory healthy weight Body mass index (BMI) is Gregory measurement that can be used to identify possible weight problems. It estimates body fat based on height and weight. Your health care provider can help determine your BMI and help you achieve or maintain ahealthy weight. Get regular exercise Get regular exercise. This is one of the most important things you can do for your  health. Most adults should: Exercise for at least 150 minutes each week. The exercise should increase your heart rate and make you sweat (moderate-intensity exercise). Do strengthening exercises at least twice Gregory week. This is in addition to the moderate-intensity exercise. Spend less time sitting. Even light physical activity can be beneficial. Watch cholesterol and blood lipids Have your blood tested for lipids and cholesterol at 39 years of age, then havethis test every 5 years. You may need to have your cholesterol levels checked more often if: Your lipid or cholesterol levels are high. You are older than 39 years of age. You are at high risk for heart disease. What should I know about cancer screening? Many types of cancers can be detected early and may often be prevented. Depending on your health history and family history, you may need to have cancer screening at various ages. This may include screening for: Colorectal cancer. Prostate cancer. Skin cancer. Lung cancer. What should I know about heart disease, diabetes, and high blood pressure? Blood pressure and heart disease High blood pressure causes heart disease and increases the risk of stroke. This is more likely to develop in people who have high blood pressure readings, are of African descent, or are overweight. Talk with your health care provider about your target blood pressure readings. Have your blood pressure checked: Every 3-5 years if you are 60-41 years of age. Every year if you are 23 years old or older. If you are between the ages of 81 and 59 and are Gregory current or former smoker, ask your health care provider if you should have Gregory one-time screening for abdominal aortic aneurysm (AAA). Diabetes Have regular diabetes screenings. This checks your fasting blood sugar level. Have the screening Gregory: Once every three years after age 45 if you are at Gregory normal weight and have Gregory low risk for diabetes. More often and at Gregory  younger age if you are overweight or have Gregory high risk for diabetes. What should I know about preventing infection? Hepatitis B If you have Gregory higher risk for hepatitis B, you should be screened for this virus. Talk  with your health care provider to find out if you are at risk forhepatitis B infection. Hepatitis C Blood testing is recommended for: Everyone born from [birth year] through 1964. Anyone with known risk factors for hepatitis C. Sexually transmitted infections (STIs) You should be screened each year for STIs, including gonorrhea and chlamydia, if: You are sexually active and are younger than 39 years of age. You are older than 39 years of age and your health care provider tells you that you are at risk for this type of infection. Your sexual activity has changed since you were last screened, and you are at increased risk for chlamydia or gonorrhea. Ask your health care provider if you are at risk. Ask your health care provider about whether you are at high risk for HIV. Your health care provider may recommend Gregory prescription medicine to help prevent HIV infection. If you choose to take medicine to prevent HIV, you should first get tested for HIV. You should then be tested every 3 months for as long as you are taking the medicine. Follow these instructions at home: Lifestyle Do not use any products that contain nicotine or tobacco, such as cigarettes, e-cigarettes, and chewing tobacco. If you need help quitting, ask your health care provider. Do not use street drugs. Do not share needles. Ask your health care provider for help if you need support or information about quitting drugs. Alcohol use Do not drink alcohol if your health care provider tells you not to drink. If you drink alcohol: Limit how much you have to 0-2 drinks Gregory day. Be aware of how much alcohol is in your drink. In the [place] one drink equals one 12 oz bottle of beer (355 mL), one 5 oz glass of wine (148 mL), or one 1 oz glass  of hard liquor (44 mL). General instructions Schedule regular health, dental, and eye exams. Stay current with your vaccines. Tell your health care provider if: You often feel depressed. You have ever been abused or do not feel safe at home. Summary Adopting Gregory healthy lifestyle and getting preventive care are important in promoting health and wellness. Follow your health care provider's instructions about healthy diet, exercising, and getting tested or screened for diseases. Follow your health care provider's instructions on monitoring your cholesterol and blood pressure. This information is not intended to replace advice given to you by your health care provider. Make sure you discuss any questions you have with your healthcare provider. Document Revised: 03/14/2018 Document Reviewed: 03/14/2018 Elsevier Patient Education  2022 [place], MD [facility]

## 2020-12-01 DIAGNOSIS — G4733 Obstructive sleep apnea (adult) (pediatric): Secondary | ICD-10-CM | POA: Diagnosis not present

## 2021-01-01 ENCOUNTER — Encounter: Payer: Self-pay | Admitting: Cardiology

## 2021-01-01 ENCOUNTER — Ambulatory Visit: Payer: BC Managed Care – PPO | Admitting: Cardiology

## 2021-01-01 ENCOUNTER — Other Ambulatory Visit: Payer: Self-pay

## 2021-01-01 ENCOUNTER — Ambulatory Visit (INDEPENDENT_AMBULATORY_CARE_PROVIDER_SITE_OTHER): Payer: BC Managed Care – PPO | Admitting: Cardiology

## 2021-01-01 VITALS — BP 138/80 | HR 85 | Ht 71.0 in | Wt 306.6 lb

## 2021-01-01 DIAGNOSIS — R0789 Other chest pain: Secondary | ICD-10-CM

## 2021-01-01 DIAGNOSIS — I10 Essential (primary) hypertension: Secondary | ICD-10-CM

## 2021-01-01 NOTE — Progress Notes (Addendum)
Cardiology CONSULT Note    Date:  01/01/2021   ID:  Brendan Gregory, DOB [birth year], MRN [id number]  PCP:  Horald Pollen, MD  Cardiologist:  Fransico Him, MD   Chief Complaint  Patient presents with   Chest Pain   Palpitations   Hypertension     History of Present Illness:  Brendan Gregory is a 40 y.o. male who is being seen today for the evaluation of chest pain at the request of Horald Pollen, *.  This is a 40yo male with a hx of HTN, OSA on CPAP and occasional marijuana and ETOH use who is referred for evaluation of chest pain .  He had COVID 19 in June 2022 and since then has had problems with some post COVID issues.  He has had intermittent left sided CP that usually occurs .  He says that the pain is mid sternal and discomfort 3/10 with no radiation to his neck or arms.  There is no associated nausea or diaphoresis.  One evening he had a very elevated BP at 181/144mmHg and called EMS.  He felt very anxious at the time.  His EKG was normal and did not proceed to ER.    He says that he feels sometimes like his fat fold gets stuck up under his left side but sometimes under his right lower ribs and cannot tell if its his heart or a muscle under his ribs.  He denies any exertional chest pain or pressure.  He has been told in the past in [place] that he had cardiomegaly and has morbid obesity. He has been trying to diet and has lost 60lbs and has noticed that he gets anxious if he eats anything high in Na.    He says that he will have intermittent fatigue post COVID 19.  He does have some SOB but more describes it as a need to take a deep breath at times.  He denies any PND, orthopnea, LE edema, dizziness or syncope.    Past Medical History:  Diagnosis Date   Hypertension    Panic attacks     No past surgical history on file.  Current Medications: Current Meds  Medication Sig   Cholecalciferol (VITAMIN D3 PO) Take 5,000 Units by mouth daily.   folic acid  (FOLVITE) 073 MCG tablet Take 400 mcg by mouth daily.   GRAPE SEED EXTRACT PO Take 2 capsules by mouth daily.   losartan (COZAAR) 100 MG tablet TAKE 1 TABLET BY MOUTH EVERY DAY   magnesium gluconate (MAGONATE) 500 MG tablet Take 500 mg by mouth daily.   Omega-3 Fatty Acids (FISH OIL) 1000 MG CAPS Take by mouth daily.    Allergies:   Patient has no known allergies.   Social History   Socioeconomic History   Marital status: Married    Spouse name: Not on file   Number of children: Not on file   Years of education: Not on file   Highest education level: Not on file  Occupational History   Not on file  Tobacco Use   Smoking status: Never   Smokeless tobacco: Never  Substance and Sexual Activity   Alcohol use: Not on file    Comment: occasionally-beer   Drug use: Yes    Frequency: 3.0 times per week    Types: Marijuana    Comment: per week   Sexual activity: Not on file  Other Topics Concern   Not on file  Social History Narrative  Not on file   Social Determinants of Health   Financial Resource Strain: Not on file  Food Insecurity: Not on file  Transportation Needs: Not on file  Physical Activity: Not on file  Stress: Not on file  Social Connections: Not on file     Family History:  The patient's family history includes Diabetes in his brother, father, and maternal grandfather; Healthy in his mother; Hypertension in his brother and father; Prostate cancer in his father.   ROS:   Please see the history of present illness.    ROS All other systems reviewed and are negative.  No flowsheet data found.     PHYSICAL EXAM:   VS:  BP 138/80   Pulse 85   Ht 5\' 11"  (1.803 m)   Wt (!) 306 lb 9.6 oz (139.1 kg)   SpO2 97%   BMI 42.76 kg/m    GEN: Well nourished, well developed, in no acute distress  HEENT: normal  Neck: no JVD, carotid bruits, or masses Cardiac: RRR; no murmurs, rubs, or gallops,no edema.  Intact distal pulses bilaterally.  Respiratory:  clear to  auscultation bilaterally, normal work of breathing GI: soft, nontender, nondistended, + BS MS: no deformity or atrophy  Skin: warm and dry, no rash Neuro:  Alert and Oriented x 3, Strength and sensation are intact Psych: euthymic mood, full affect  Wt Readings from Last 3 Encounters:  01/01/21 (!) 306 lb 9.6 oz (139.1 kg)  11/24/20 (!) 309 lb (140.2 kg)  09/16/19 (!) 332 lb 6.4 oz (150.8 kg)      Studies/Labs Reviewed:   EKG:  EKG is ordered today.  The ekg ordered today demonstrates NSR  Recent Labs: 11/24/2020: ALT 30; BUN 16; Creatinine, Ser 0.96; Hemoglobin 13.9; Platelets 294.0; Potassium 3.8; Sodium 136   Lipid Panel    Component Value Date/Time   CHOL 134 11/24/2020 1540   CHOL 164 08/19/2019 0822   TRIG 161.0 (H) 11/24/2020 1540   HDL 31.00 (L) 11/24/2020 1540   HDL 32 (L) 08/19/2019 0822   CHOLHDL 4 11/24/2020 1540   VLDL 32.2 11/24/2020 1540   LDLCALC 71 11/24/2020 1540   LDLCALC 103 (H) 08/19/2019 1610     Additional studies/ records that were reviewed today include:  OV notes from PCP    ASSESSMENT:    1. Other chest pain   2. Essential hypertension      PLAN:  In order of problems listed above:  Chest pain -started after COVID 19 and is atypical -he only CRF is HTN and his EKG is nonischemic -I think we should check a 2D echo to rule out pericardial effusion, assess LVF and make sure his heart is normal size given > hx of cardiomegaly in the past but I suspect that the dx was based on  CXray and likely a pericardial fat pad given his morbid obesity -check coronary Ca score to determine future cardiac risk -I will get an ETT to rule out ischemia given normal baseline EKG -Shared Decision Making/Informed Consent The risks [chest pain, shortness of breath, cardiac arrhythmias, dizziness, blood pressure fluctuations, myocardial infarction, stroke/transient ischemic attack, and life-threatening complications (estimated to be 1 in 10,000)], benefits  (risk stratification, diagnosing coronary artery disease, treatment guidance) and alternatives of an exercise tolerance test were discussed in detail with Brendan Gregory and he agrees to proceed.  2. HTN -BP controlled on exam today -he had a problem with elevated BPs for a while but seems ok today>>he was dx with  panic attacks recently -will get a 48 hour BP monitor to assess for adequacy of control -continue prescription drug management with Losartan 100mg  daily  3.  Morbid Obesity -he is actively losing weight with exercise and diet   Time Spent: 20 minutes total time of encounter, including 15 minutes spent in face-to-face patient care on the date of this encounter. This time includes coordination of care and counseling regarding above mentioned problem list. Remainder of non-face-to-face time involved reviewing chart documents/testing relevant to the patient encounter and documentation in the medical record. I have independently reviewed documentation from referring provider  Medication Adjustments/Labs and Tests Ordered: Current medicines are reviewed at length with the patient today.  Concerns regarding medicines are outlined above.  Medication changes, Labs and Tests ordered today are listed in the Patient Instructions below.  There are no Patient Instructions on file for this visit.   Signed, Fransico Him, MD  01/01/2021 3:11 PM    [facility] [address], [place],   [address] Phone: [phone]; Fax: [phone]

## 2021-01-01 NOTE — Addendum Note (Signed)
Addended by: Antonieta Iba on: 01/01/2021 03:28 PM   Modules accepted: Orders

## 2021-01-01 NOTE — Patient Instructions (Signed)
Medication Instructions:  Your physician recommends that you continue on your current medications as directed. Please refer to the Current Medication list given to you today.  *If you need a refill on your cardiac medications before your next appointment, please call your pharmacy*   Testing/Procedures: Your physician has requested that you have an echocardiogram. Echocardiography is a painless test that uses sound waves to create images of your heart. It provides your doctor with information about the size and shape of your heart and how well your heart's chambers and valves are working. This procedure takes approximately one hour. There are no restrictions for this procedure.  Your physician has requested that you have an exercise tolerance test. For further information please visit [email or web address]. Please also follow instruction sheet, as given.  Your physician has requested that you have a calcium score CT scan.   Follow-Up: At [facility], you and your health needs are our priority.  As part of our continuing mission to provide you with exceptional heart care, we have created designated Provider Care Teams.  These Care Teams include your primary Cardiologist (physician) and Advanced Practice Providers (APPs -  Physician Assistants and Nurse Practitioners) who all work together to provide you with the care you need, when you need it.  Follow up with Dr .Turner as needed based on results of testing.    

## 2021-01-01 NOTE — Addendum Note (Signed)
Addended by: Antonieta Iba on: 01/01/2021 03:20 PM   Modules accepted: Orders

## 2021-01-02 ENCOUNTER — Telehealth: Payer: BC Managed Care – PPO | Admitting: Nurse Practitioner

## 2021-01-02 DIAGNOSIS — M549 Dorsalgia, unspecified: Secondary | ICD-10-CM

## 2021-01-02 MED ORDER — CYCLOBENZAPRINE HCL 10 MG PO TABS: 10.0000 mg | ORAL_TABLET | Freq: Three times a day (TID) | ORAL | 1 refills | Status: DC | PRN

## 2021-01-02 MED ORDER — PREDNISONE 10 MG (21) PO TBPK: ORAL_TABLET | ORAL | 0 refills | Status: DC

## 2021-01-02 NOTE — Patient Instructions (Signed)

## 2021-01-02 NOTE — Progress Notes (Signed)
Virtual Visit Consent   Brendan Gregory, you are scheduled for a virtual visit with Brendan Gregory, [place], a [facility] provider, today.     Just as with appointments in the office, your consent must be obtained to participate.  Your consent will be active for this visit and any virtual visit you may have with one of our providers in the next 365 days.     If you have a MyChart account, a copy of this consent can be sent to you electronically.  All virtual visits are billed to your insurance company just like a traditional visit in the office.    As this is a virtual visit, video technology does not allow for your provider to perform a traditional examination.  This may limit your provider's ability to fully assess your condition.  If your provider identifies any concerns that need to be evaluated in person or the need to arrange testing (such as labs, EKG, etc.), we will make arrangements to do so.     Although advances in technology are sophisticated, we cannot ensure that it will always work on either your end or our end.  If the connection with a video visit is poor, the visit may have to be switched to a telephone visit.  With either a video or telephone visit, we are not always able to ensure that we have a secure connection.     I need to obtain your verbal consent now.   Are you willing to proceed with your visit today? YES   Brendan Gregory has provided verbal consent on 01/02/2021 for a virtual visit (video or telephone).   Brendan Hassell Done, FNP   Date: 01/02/2021 2:47 PM   Virtual Visit via Video Note   I, Brendan Gregory, connected with Brendan Gregory ([id number], [birth year]) on 01/02/21 at  2:45 PM EDT by a video-enabled telemedicine application and verified that I am speaking with the correct person using two identifiers.  Location: Patient: Virtual Visit Location Patient: Home Provider: Virtual Visit Location Provider: Mobile   I discussed the limitations of  evaluation and management by telemedicine and the availability of in person appointments. The patient expressed understanding and agreed to proceed.    History of Present Illness: Brendan Gregory is a 40 y.o. who identifies as a male who was assigned male at birth, and is being seen today for back pain that started yesterday. The pain is located in mid to upper back and radiates to chest. He denies a rash. Rates pain 8/10 when he moves. Lying flat decreases pain. He has tried tylenol , advil and lidociane patch which has not helped. He denies any known injury.  HPI:   Problems:  Patient Active Problem List   Diagnosis Date Noted   Nonspecific chest pain 11/24/2020   Morbid obesity ([place]) 11/24/2020   Obstructive sleep apnea on CPAP 05/16/2019   Essential hypertension 05/16/2019    Allergies: No Known Allergies Medications:  Current Outpatient Medications:    Cholecalciferol (VITAMIN D3 PO), Take 5,000 Units by mouth daily., Disp: , Rfl:    folic acid (FOLVITE) 696 MCG tablet, Take 400 mcg by mouth daily., Disp: , Rfl:    GRAPE SEED EXTRACT PO, Take 2 capsules by mouth daily., Disp: , Rfl:    losartan (COZAAR) 100 MG tablet, TAKE 1 TABLET BY MOUTH EVERY DAY, Disp: 90 tablet, Rfl: 0   magnesium gluconate (MAGONATE) 500 MG tablet, Take 500 mg by mouth daily., Disp: , Rfl:    Omega-3  Fatty Acids (FISH OIL) 1000 MG CAPS, Take by mouth daily., Disp: , Rfl:   Observations/Objective: Patient is well-developed, well-nourished in no acute distress.  Resting comfortably  at home.  Head is normocephalic, atraumatic.  No labored breathing.  Speech is clear and coherent with logical content.  Patient is alert and oriented at baseline.  Patient is laying flat on abdomen and painful to move in any direction.  Assessment and Plan:  [name] in today with chief complaint of No chief complaint on file.   1. Mid-back pain, acute Moist heat Rest No bending or twisting  No lifting Sedation  precautions with flexeril If not improving needs to see PCP Meds ordered this encounter  Medications   predniSONE (STERAPRED UNI-PAK 21 TAB) 10 MG (21) TBPK tablet    Sig: As directed x 6 days    Dispense:  21 tablet    Refill:  0    Order Specific Question:   Supervising Provider    Answer:   Sabra Heck, BRIAN [[id number]]   cyclobenzaprine (FLEXERIL) 10 MG tablet    Sig: Take 1 tablet (10 mg total) by mouth 3 (three) times daily as needed for muscle spasms.    Dispense:  30 tablet    Refill:  1    Order Specific Question:   Supervising Provider    Answer:   Noemi Chapel [[id number]]   AVS back pain   Follow Up Instructions: I discussed the assessment and treatment plan with the patient. The patient was provided an opportunity to ask questions and all were answered. The patient agreed with the plan and demonstrated an understanding of the instructions.  A copy of instructions were sent to the patient via MyChart.  The patient was advised to call back or seek an in-person evaluation if the symptoms worsen or if the condition fails to improve as anticipated.  Time:  I spent 12 minutes with the patient via telehealth technology discussing the above problems/concerns.    Brendan Hassell Done, FNP

## 2021-01-03 NOTE — Addendum Note (Signed)
Addended by: Fransico Him R on: 01/03/2021 11:21 AM   Modules accepted: Orders

## 2021-01-07 ENCOUNTER — Ambulatory Visit: Payer: BC Managed Care – PPO | Admitting: Cardiology

## 2021-01-20 DIAGNOSIS — G4733 Obstructive sleep apnea (adult) (pediatric): Secondary | ICD-10-CM | POA: Diagnosis not present

## 2021-01-22 ENCOUNTER — Other Ambulatory Visit: Payer: Self-pay

## 2021-01-22 ENCOUNTER — Ambulatory Visit (INDEPENDENT_AMBULATORY_CARE_PROVIDER_SITE_OTHER): Payer: BC Managed Care – PPO | Admitting: Internal Medicine

## 2021-01-22 ENCOUNTER — Encounter: Payer: Self-pay | Admitting: Internal Medicine

## 2021-01-22 DIAGNOSIS — I10 Essential (primary) hypertension: Secondary | ICD-10-CM

## 2021-01-22 DIAGNOSIS — G4733 Obstructive sleep apnea (adult) (pediatric): Secondary | ICD-10-CM

## 2021-01-22 DIAGNOSIS — Z9989 Dependence on other enabling machines and devices: Secondary | ICD-10-CM | POA: Diagnosis not present

## 2021-01-22 DIAGNOSIS — R079 Chest pain, unspecified: Secondary | ICD-10-CM

## 2021-01-22 MED ORDER — VALSARTAN 320 MG PO TABS: 320.0000 mg | ORAL_TABLET | Freq: Every day | ORAL | 3 refills | Status: DC

## 2021-01-22 NOTE — Assessment & Plan Note (Signed)
Moderate exacerbation with rare reading to 180/110 and some readings in the 150s/90s. Will change losartan 100 mg daily to valsartan 320 mg daily for better efficacy. He is currently having rare chest pains and undergoing workup of that with cardiology. He is encouraged to keep visits for CT calcium score, stress test, and echo.

## 2021-01-22 NOTE — Patient Instructions (Signed)
We have sent in valsartan to switch to from the losartan.

## 2021-01-22 NOTE — Progress Notes (Signed)
   Subjective:   Patient ID: Brendan Gregory, male    DOB: [birth year], 40 y.o.   MRN: [id number]  HPI The patient is a 40 YO man coming in for concerns about blood pressure. Newborn and some sleep deprivation, still using CPAP.  Review of Systems  Constitutional: Negative.   HENT: Negative.    Eyes: Negative.   Respiratory:  Positive for chest tightness. Negative for cough and shortness of breath.   Cardiovascular:  Negative for chest pain, palpitations and leg swelling.  Gastrointestinal:  Negative for abdominal distention, abdominal pain, constipation, diarrhea, nausea and vomiting.  Musculoskeletal: Negative.   Skin: Negative.   Neurological: Negative.   Psychiatric/Behavioral: Negative.     Objective:  Physical Exam Constitutional:      Appearance: He is well-developed. He is obese.  HENT:     Head: Normocephalic and atraumatic.  Cardiovascular:     Rate and Rhythm: Normal rate and regular rhythm.  Pulmonary:     Effort: Pulmonary effort is normal. No respiratory distress.     Breath sounds: Normal breath sounds. No wheezing or rales.  Abdominal:     General: Bowel sounds are normal. There is no distension.     Palpations: Abdomen is soft.     Tenderness: There is no abdominal tenderness. There is no rebound.  Musculoskeletal:     Cervical back: Normal range of motion.  Skin:    General: Skin is warm and dry.  Neurological:     Mental Status: He is alert and oriented to person, place, and time.     Coordination: Coordination normal.    Vitals:   01/22/21 0815  BP: 132/90  Pulse: 74  Resp: 18  SpO2: 97%  Weight: 297 lb 3.2 oz (134.8 kg)  Height: 5\' 11"  (1.803 m)    This visit occurred during the SARS-CoV-2 public health emergency.  Safety protocols were in place, including screening questions prior to the visit, additional usage of staff PPE, and extensive cleaning of exam room while observing appropriate contact time as indicated for disinfecting solutions.    Assessment & Plan:

## 2021-01-22 NOTE — Assessment & Plan Note (Signed)
We will increase strength of BP regimen as he is having uncontrolled readings. Rx valsartan 320 mg daily and stop losartan 100 mg daily.

## 2021-01-22 NOTE — Assessment & Plan Note (Signed)
He is still using CPAP regularly and encouraged to continue and reminded that not using CPAP can cause BP readings to increase.

## 2021-01-25 ENCOUNTER — Ambulatory Visit (INDEPENDENT_AMBULATORY_CARE_PROVIDER_SITE_OTHER)
Admission: RE | Admit: 2021-01-25 | Discharge: 2021-01-25 | Disposition: A | Discharge status: Home / Self Care | Payer: Self-pay | Source: Ambulatory Visit | Attending: Cardiology | Admitting: Cardiology

## 2021-01-25 ENCOUNTER — Encounter: Payer: Self-pay | Admitting: Cardiology

## 2021-01-25 ENCOUNTER — Other Ambulatory Visit: Payer: Self-pay

## 2021-01-25 DIAGNOSIS — R0789 Other chest pain: Secondary | ICD-10-CM

## 2021-01-25 DIAGNOSIS — R931 Abnormal findings on diagnostic imaging of heart and coronary circulation: Secondary | ICD-10-CM | POA: Insufficient documentation

## 2021-01-26 ENCOUNTER — Telehealth: Payer: Self-pay

## 2021-01-26 ENCOUNTER — Other Ambulatory Visit: Payer: Self-pay | Admitting: Emergency Medicine

## 2021-01-26 ENCOUNTER — Encounter: Payer: Self-pay | Admitting: Emergency Medicine

## 2021-01-26 DIAGNOSIS — R0789 Other chest pain: Secondary | ICD-10-CM

## 2021-01-26 DIAGNOSIS — R911 Solitary pulmonary nodule: Secondary | ICD-10-CM

## 2021-01-26 DIAGNOSIS — R931 Abnormal findings on diagnostic imaging of heart and coronary circulation: Secondary | ICD-10-CM

## 2021-01-26 NOTE — Telephone Encounter (Signed)
The patient has been notified of the result and verbalized understanding.  All questions (if any) were answered. Brendan Iba, RN 01/26/2021 1:38 PM  Patient would like to wait another month to have his cholesterol rechecked. He states that he just had it done two months ago and since then he has been exercising more frequently, has changed his diet, and has lost weight. He has an echocardiogram and stress test on Thursday. He will schedule his lab work at that time.

## 2021-01-26 NOTE — Telephone Encounter (Signed)
-----   Message from Sueanne Margarita, MD sent at 01/25/2021 10:41 PM EDT ----- Coronary Ca score is 1> please have patient come in for FLP

## 2021-01-27 NOTE — Telephone Encounter (Signed)
That is a question for the cardiologist who ordered the test.  Thanks.

## 2021-01-28 ENCOUNTER — Ambulatory Visit (INDEPENDENT_AMBULATORY_CARE_PROVIDER_SITE_OTHER): Payer: BC Managed Care – PPO

## 2021-01-28 ENCOUNTER — Other Ambulatory Visit: Payer: Self-pay

## 2021-01-28 ENCOUNTER — Inpatient Hospital Stay: Admission: RE | Admit: 2021-01-28 | Payer: BC Managed Care – PPO | Source: Ambulatory Visit

## 2021-01-28 ENCOUNTER — Ambulatory Visit (HOSPITAL_COMMUNITY): Payer: BC Managed Care – PPO | Attending: Internal Medicine

## 2021-01-28 DIAGNOSIS — R0789 Other chest pain: Secondary | ICD-10-CM | POA: Insufficient documentation

## 2021-01-28 DIAGNOSIS — I10 Essential (primary) hypertension: Secondary | ICD-10-CM | POA: Diagnosis not present

## 2021-01-28 LAB — EXERCISE TOLERANCE TEST
Angina Index: 0
Duke Treadmill Score: 1
Estimated workload: 7
Exercise duration (min): 5 min
Exercise duration (sec): 31 s
MPHR: 181 {beats}/min
Peak HR: 162 {beats}/min
Percent HR: 89 %
RPE: 16
Rest HR: 81 {beats}/min
ST Depression (mm): 1 mm

## 2021-01-28 LAB — ECHOCARDIOGRAM COMPLETE: Area-P 1/2: 4.04 cm2

## 2021-01-28 NOTE — Progress Notes (Unsigned)
Patient declined to consent to the administration of Definity.

## 2021-01-29 ENCOUNTER — Telehealth: Payer: Self-pay

## 2021-01-29 DIAGNOSIS — R0789 Other chest pain: Secondary | ICD-10-CM

## 2021-01-29 MED ORDER — METOPROLOL TARTRATE 100 MG PO TABS: 100.0000 mg | ORAL_TABLET | Freq: Once | ORAL | 0 refills | Status: DC

## 2021-01-29 NOTE — Telephone Encounter (Signed)
-----   Message from Sueanne Margarita, MD sent at 01/29/2021 12:05 PM EDT ----- Equivocal stress test in setting of hypertensive BP response.  Please get a coronary CTA using the flash protocol. He will need Lopressor 100mg  and BMET.  Please make sure that he follows through with 48hr BP monitor

## 2021-01-29 NOTE — Telephone Encounter (Signed)
The patient has been notified of the result and verbalized understanding.  All questions (if any) were answered. Antonieta Iba, RN 01/29/2021 1:38 PM   Coronary CTA scan has been ordered

## 2021-02-01 ENCOUNTER — Telehealth: Payer: Self-pay | Admitting: Emergency Medicine

## 2021-02-01 NOTE — Telephone Encounter (Signed)
Patient calling to inform medication valsartan (DIOVAN) 320 MG tablet is not helping  Patient is requesting a new rx  Please advise

## 2021-02-02 ENCOUNTER — Other Ambulatory Visit: Payer: Self-pay | Admitting: Emergency Medicine

## 2021-02-02 DIAGNOSIS — I10 Essential (primary) hypertension: Secondary | ICD-10-CM

## 2021-02-02 MED ORDER — VALSARTAN-HYDROCHLOROTHIAZIDE 320-25 MG PO TABS: 1.0000 | ORAL_TABLET | Freq: Every day | ORAL | 3 refills | Status: DC

## 2021-02-02 NOTE — Progress Notes (Signed)
Uncontrolled hypertension.  Switching from valsartan to valsartan-HCTZ 320-25 mg daily.

## 2021-02-02 NOTE — Telephone Encounter (Signed)
Stop valsartan and start taking combo pill valsartan-HCTZ 320-25 mg daily.  New prescription sent to pharmacy of record.  Thanks.

## 2021-02-02 NOTE — Telephone Encounter (Signed)
Called and left vm with results.

## 2021-02-09 ENCOUNTER — Other Ambulatory Visit (HOSPITAL_COMMUNITY): Payer: Self-pay | Admitting: Emergency Medicine

## 2021-02-09 ENCOUNTER — Telehealth (HOSPITAL_COMMUNITY): Payer: Self-pay | Admitting: Emergency Medicine

## 2021-02-09 DIAGNOSIS — R0789 Other chest pain: Secondary | ICD-10-CM

## 2021-02-09 DIAGNOSIS — R931 Abnormal findings on diagnostic imaging of heart and coronary circulation: Secondary | ICD-10-CM

## 2021-02-09 NOTE — Telephone Encounter (Signed)
Pt returning phone call regarding upcoming cardiac imaging study; pt verbalizes understanding of appt date/time, parking situation and where to check in, pre-test NPO status and medications ordered, and verified current allergies; name and call back number provided for further questions should they arise Marchia Bond RN Navigator Cardiac Imaging [place] [facility] [phone] office [phone] cell  Pt states he did not know he needed new labs prior to CCTA. Therefore wishes to push scan back to allow time to get labs.   Pt with newborn- cannot get to office today for labs for tomorrows appt.   Pt plans to now get labs tomorrow 11/9 and get scanned on 11/14  Will call him again prior to scan Clarise Cruz

## 2021-02-09 NOTE — Telephone Encounter (Signed)
Attempted to call patient regarding upcoming cardiac CT appointment. Left message on voicemail with name and callback number Brendan Bond RN Navigator Cardiac Imaging [name] [phone] Office [phone] Cell   Courtesy call to remind that we need labs prior to CCTA.

## 2021-02-10 ENCOUNTER — Ambulatory Visit (HOSPITAL_COMMUNITY): Payer: BC Managed Care – PPO

## 2021-02-10 ENCOUNTER — Other Ambulatory Visit: Payer: BC Managed Care – PPO

## 2021-02-10 ENCOUNTER — Other Ambulatory Visit: Payer: Self-pay

## 2021-02-10 DIAGNOSIS — Z5181 Encounter for therapeutic drug level monitoring: Secondary | ICD-10-CM | POA: Diagnosis not present

## 2021-02-10 DIAGNOSIS — R079 Chest pain, unspecified: Secondary | ICD-10-CM | POA: Diagnosis not present

## 2021-02-10 DIAGNOSIS — R0789 Other chest pain: Secondary | ICD-10-CM | POA: Diagnosis not present

## 2021-02-11 ENCOUNTER — Telehealth: Payer: Self-pay

## 2021-02-11 DIAGNOSIS — R931 Abnormal findings on diagnostic imaging of heart and coronary circulation: Secondary | ICD-10-CM

## 2021-02-11 LAB — BASIC METABOLIC PANEL
BUN/Creatinine Ratio: 16 (ref 9–20)
BUN: 15 mg/dL (ref 6–20)
CO2: 23 mmol/L (ref 20–29)
Calcium: 9.7 mg/dL (ref 8.7–10.2)
Chloride: 95 mmol/L — ABNORMAL LOW (ref 96–106)
Creatinine, Ser: 0.91 mg/dL (ref 0.76–1.27)
Glucose: 83 mg/dL (ref 70–99)
Potassium: 4 mmol/L (ref 3.5–5.2)
Sodium: 137 mmol/L (ref 134–144)
eGFR: 110 mL/min/{1.73_m2} (ref >59)

## 2021-02-11 LAB — LIPID PANEL
Chol/HDL Ratio: 4.5 ratio (ref 0.0–5.0)
Cholesterol, Total: 177 mg/dL (ref 100–199)
HDL: 39 mg/dL — ABNORMAL LOW (ref >39)
LDL Chol Calc (NIH): 113 mg/dL — ABNORMAL HIGH (ref 0–99)
Triglycerides: 140 mg/dL (ref 0–149)
VLDL Cholesterol Cal: 25 mg/dL (ref 5–40)

## 2021-02-11 MED ORDER — ATORVASTATIN CALCIUM 10 MG PO TABS: 10.0000 mg | ORAL_TABLET | Freq: Every day | ORAL | 3 refills | Status: DC

## 2021-02-11 NOTE — Telephone Encounter (Signed)
-----   Message from Sueanne Margarita, MD sent at 02/11/2021  8:45 AM EST ----- LDL goal is less than 70 given his coronary calcium score of 1.  Please start on atorvastatin 10 mg daily and repeat FLP and ALT in 6 weeks

## 2021-02-11 NOTE — Telephone Encounter (Signed)
The patient has been notified of the result and verbalized understanding.  All questions (if any) were answered. Antonieta Iba, RN 02/11/2021 1:26 PM  Rx has been sent in . Repeat labs have been scheduled.

## 2021-02-12 ENCOUNTER — Encounter (HOSPITAL_COMMUNITY): Payer: Self-pay

## 2021-02-12 ENCOUNTER — Telehealth (HOSPITAL_COMMUNITY): Payer: Self-pay | Admitting: *Deleted

## 2021-02-12 NOTE — Telephone Encounter (Signed)
Reaching out to patient to offer assistance regarding upcoming cardiac imaging study; pt verbalizes understanding of appt date/time, parking situation and where to check in, pre-test NPO status and medications ordered, and verified current allergies; name and call back number provided for further questions should they arise  Brendan Clement RN Navigator Cardiac Imaging [name] [phone] office [phone] cell  Patient is aware to arrive at 3:30pm for his 4pm scan and will take 100mg  metoprolol tartrate two hours prior to cardiac CT. Instructions also sent to his MyChart.

## 2021-02-15 ENCOUNTER — Ambulatory Visit (HOSPITAL_COMMUNITY)
Admission: RE | Admit: 2021-02-15 | Discharge: 2021-02-15 | Disposition: A | Discharge status: Home / Self Care | Payer: BC Managed Care – PPO | Source: Ambulatory Visit | Attending: Cardiology | Admitting: Cardiology

## 2021-02-15 ENCOUNTER — Other Ambulatory Visit: Payer: Self-pay

## 2021-02-15 DIAGNOSIS — R0789 Other chest pain: Secondary | ICD-10-CM | POA: Diagnosis not present

## 2021-02-15 MED ORDER — NITROGLYCERIN 0.4 MG SL SUBL: 0.8000 mg | SUBLINGUAL_TABLET | Freq: Once | SUBLINGUAL | Status: AC

## 2021-02-15 MED ORDER — IOHEXOL 350 MG/ML SOLN
95.0000 mL | Freq: Once | INTRAVENOUS | Status: AC | PRN
  Administered 2021-02-15: 95 mL via INTRAVENOUS

## 2021-02-15 MED ORDER — NITROGLYCERIN 0.4 MG SL SUBL
SUBLINGUAL_TABLET | SUBLINGUAL | Status: AC
  Administered 2021-02-15: 0.8 mg via SUBLINGUAL
  Filled 2021-02-15: qty 2

## 2021-02-15 NOTE — Progress Notes (Signed)
Patient here for a CT Cardiac Study.  Following the test, it was found that patient had swelling in the arm following the CT scan.  Area was not red and patient did not complain of pain.  NP York Cerise was made aware and spoke with the patient.  Patient given an ice pack.  Patient was alert and oriented at discharge and understood signs and symptoms to look for in his arm.  Patient was ambulatory to the elevator with stable and steady gait.  VS stable at discharge.

## 2021-02-15 NOTE — Progress Notes (Signed)
  Evaluation after Contrast Extravasation  Patient seen and examined immediately after contrast extravasation while in IR Nurse's station, [employer]. Patient presented for CT heart. Unsure how much contrast possibly extravasated.   Exam: There is swelling at the right antecubital area.  There is no erythema. There is no discoloration. There are no blisters. There are no signs of decreased perfusion of the skin.  It is warm to touch.  The patient has full ROM in fingers.  Radial pulse is normal.  Per contrast extravasation protocol, I have instructed the patient to keep an ice pack on the area for 20-60 minutes at a time for about 48 hours.   Keep arm elevated as much as possible.   The patient understands to call the radiology department if there is: - increase in pain or swelling - changed or altered sensation - ulceration or blistering - increasing redness - warmth or increasing firmness - decreased tissue perfusion as noted by decreased capillary refill or discoloration of skin - decreased pulses peripheral to site   Theresa Duty, NP 02/15/2021 4:42 PM

## 2021-02-16 ENCOUNTER — Encounter: Payer: Self-pay | Admitting: Cardiology

## 2021-02-22 ENCOUNTER — Other Ambulatory Visit: Payer: Self-pay

## 2021-02-22 ENCOUNTER — Ambulatory Visit (INDEPENDENT_AMBULATORY_CARE_PROVIDER_SITE_OTHER): Payer: BC Managed Care – PPO

## 2021-02-22 DIAGNOSIS — I10 Essential (primary) hypertension: Secondary | ICD-10-CM

## 2021-02-22 DIAGNOSIS — R0789 Other chest pain: Secondary | ICD-10-CM

## 2021-02-22 NOTE — Progress Notes (Unsigned)
24 Hour ambulatory blood pressure monitor applied using adult large cuff size 3.

## 2021-02-23 ENCOUNTER — Other Ambulatory Visit (HOSPITAL_COMMUNITY): Payer: BC Managed Care – PPO

## 2021-02-24 DIAGNOSIS — M9901 Segmental and somatic dysfunction of cervical region: Secondary | ICD-10-CM | POA: Diagnosis not present

## 2021-02-24 DIAGNOSIS — M62838 Other muscle spasm: Secondary | ICD-10-CM | POA: Diagnosis not present

## 2021-02-24 DIAGNOSIS — M9902 Segmental and somatic dysfunction of thoracic region: Secondary | ICD-10-CM | POA: Diagnosis not present

## 2021-02-24 DIAGNOSIS — M542 Cervicalgia: Secondary | ICD-10-CM | POA: Diagnosis not present

## 2021-03-01 DIAGNOSIS — M9902 Segmental and somatic dysfunction of thoracic region: Secondary | ICD-10-CM | POA: Diagnosis not present

## 2021-03-01 DIAGNOSIS — M9901 Segmental and somatic dysfunction of cervical region: Secondary | ICD-10-CM | POA: Diagnosis not present

## 2021-03-01 DIAGNOSIS — M542 Cervicalgia: Secondary | ICD-10-CM | POA: Diagnosis not present

## 2021-03-01 DIAGNOSIS — M62838 Other muscle spasm: Secondary | ICD-10-CM | POA: Diagnosis not present

## 2021-03-03 DIAGNOSIS — M9902 Segmental and somatic dysfunction of thoracic region: Secondary | ICD-10-CM | POA: Diagnosis not present

## 2021-03-03 DIAGNOSIS — M62838 Other muscle spasm: Secondary | ICD-10-CM | POA: Diagnosis not present

## 2021-03-03 DIAGNOSIS — M9901 Segmental and somatic dysfunction of cervical region: Secondary | ICD-10-CM | POA: Diagnosis not present

## 2021-03-03 DIAGNOSIS — M542 Cervicalgia: Secondary | ICD-10-CM | POA: Diagnosis not present

## 2021-03-04 DIAGNOSIS — M542 Cervicalgia: Secondary | ICD-10-CM | POA: Diagnosis not present

## 2021-03-04 DIAGNOSIS — M62838 Other muscle spasm: Secondary | ICD-10-CM | POA: Diagnosis not present

## 2021-03-04 DIAGNOSIS — M9901 Segmental and somatic dysfunction of cervical region: Secondary | ICD-10-CM | POA: Diagnosis not present

## 2021-03-04 DIAGNOSIS — M9902 Segmental and somatic dysfunction of thoracic region: Secondary | ICD-10-CM | POA: Diagnosis not present

## 2021-03-08 DIAGNOSIS — M542 Cervicalgia: Secondary | ICD-10-CM | POA: Diagnosis not present

## 2021-03-08 DIAGNOSIS — M9901 Segmental and somatic dysfunction of cervical region: Secondary | ICD-10-CM | POA: Diagnosis not present

## 2021-03-08 DIAGNOSIS — M9902 Segmental and somatic dysfunction of thoracic region: Secondary | ICD-10-CM | POA: Diagnosis not present

## 2021-03-08 DIAGNOSIS — M62838 Other muscle spasm: Secondary | ICD-10-CM | POA: Diagnosis not present

## 2021-03-10 DIAGNOSIS — M9902 Segmental and somatic dysfunction of thoracic region: Secondary | ICD-10-CM | POA: Diagnosis not present

## 2021-03-10 DIAGNOSIS — M9901 Segmental and somatic dysfunction of cervical region: Secondary | ICD-10-CM | POA: Diagnosis not present

## 2021-03-10 DIAGNOSIS — M542 Cervicalgia: Secondary | ICD-10-CM | POA: Diagnosis not present

## 2021-03-10 DIAGNOSIS — M62838 Other muscle spasm: Secondary | ICD-10-CM | POA: Diagnosis not present

## 2021-03-11 DIAGNOSIS — M62838 Other muscle spasm: Secondary | ICD-10-CM | POA: Diagnosis not present

## 2021-03-11 DIAGNOSIS — M9901 Segmental and somatic dysfunction of cervical region: Secondary | ICD-10-CM | POA: Diagnosis not present

## 2021-03-11 DIAGNOSIS — M542 Cervicalgia: Secondary | ICD-10-CM | POA: Diagnosis not present

## 2021-03-11 DIAGNOSIS — M9902 Segmental and somatic dysfunction of thoracic region: Secondary | ICD-10-CM | POA: Diagnosis not present

## 2021-03-15 DIAGNOSIS — M542 Cervicalgia: Secondary | ICD-10-CM | POA: Diagnosis not present

## 2021-03-15 DIAGNOSIS — M9901 Segmental and somatic dysfunction of cervical region: Secondary | ICD-10-CM | POA: Diagnosis not present

## 2021-03-15 DIAGNOSIS — M62838 Other muscle spasm: Secondary | ICD-10-CM | POA: Diagnosis not present

## 2021-03-15 DIAGNOSIS — M9902 Segmental and somatic dysfunction of thoracic region: Secondary | ICD-10-CM | POA: Diagnosis not present

## 2021-03-17 DIAGNOSIS — M9902 Segmental and somatic dysfunction of thoracic region: Secondary | ICD-10-CM | POA: Diagnosis not present

## 2021-03-17 DIAGNOSIS — M542 Cervicalgia: Secondary | ICD-10-CM | POA: Diagnosis not present

## 2021-03-17 DIAGNOSIS — M62838 Other muscle spasm: Secondary | ICD-10-CM | POA: Diagnosis not present

## 2021-03-17 DIAGNOSIS — M9901 Segmental and somatic dysfunction of cervical region: Secondary | ICD-10-CM | POA: Diagnosis not present

## 2021-03-18 DIAGNOSIS — M9902 Segmental and somatic dysfunction of thoracic region: Secondary | ICD-10-CM | POA: Diagnosis not present

## 2021-03-18 DIAGNOSIS — M9901 Segmental and somatic dysfunction of cervical region: Secondary | ICD-10-CM | POA: Diagnosis not present

## 2021-03-18 DIAGNOSIS — M542 Cervicalgia: Secondary | ICD-10-CM | POA: Diagnosis not present

## 2021-03-18 DIAGNOSIS — M62838 Other muscle spasm: Secondary | ICD-10-CM | POA: Diagnosis not present

## 2021-03-22 DIAGNOSIS — M542 Cervicalgia: Secondary | ICD-10-CM | POA: Diagnosis not present

## 2021-03-22 DIAGNOSIS — M9901 Segmental and somatic dysfunction of cervical region: Secondary | ICD-10-CM | POA: Diagnosis not present

## 2021-03-22 DIAGNOSIS — M9902 Segmental and somatic dysfunction of thoracic region: Secondary | ICD-10-CM | POA: Diagnosis not present

## 2021-03-22 DIAGNOSIS — M62838 Other muscle spasm: Secondary | ICD-10-CM | POA: Diagnosis not present

## 2021-03-24 DIAGNOSIS — M9901 Segmental and somatic dysfunction of cervical region: Secondary | ICD-10-CM | POA: Diagnosis not present

## 2021-03-24 DIAGNOSIS — M542 Cervicalgia: Secondary | ICD-10-CM | POA: Diagnosis not present

## 2021-03-24 DIAGNOSIS — M62830 Muscle spasm of back: Secondary | ICD-10-CM | POA: Diagnosis not present

## 2021-03-24 DIAGNOSIS — M62838 Other muscle spasm: Secondary | ICD-10-CM | POA: Diagnosis not present

## 2021-03-24 DIAGNOSIS — M9902 Segmental and somatic dysfunction of thoracic region: Secondary | ICD-10-CM | POA: Diagnosis not present

## 2021-04-01 ENCOUNTER — Other Ambulatory Visit: Payer: BC Managed Care – PPO

## 2021-04-14 ENCOUNTER — Other Ambulatory Visit: Payer: BC Managed Care – PPO

## 2021-04-21 DIAGNOSIS — G4733 Obstructive sleep apnea (adult) (pediatric): Secondary | ICD-10-CM | POA: Diagnosis not present

## 2021-04-30 ENCOUNTER — Other Ambulatory Visit: Payer: Self-pay

## 2021-04-30 ENCOUNTER — Other Ambulatory Visit: Payer: BC Managed Care – PPO | Admitting: *Deleted

## 2021-04-30 DIAGNOSIS — R931 Abnormal findings on diagnostic imaging of heart and coronary circulation: Secondary | ICD-10-CM

## 2021-04-30 LAB — ALT: ALT: 43 IU/L (ref 0–44)

## 2021-04-30 LAB — LIPID PANEL
Chol/HDL Ratio: 3.2 ratio (ref 0.0–5.0)
Cholesterol, Total: 112 mg/dL (ref 100–199)
HDL: 35 mg/dL — ABNORMAL LOW (ref >39)
LDL Chol Calc (NIH): 57 mg/dL (ref 0–99)
Triglycerides: 105 mg/dL (ref 0–149)
VLDL Cholesterol Cal: 20 mg/dL (ref 5–40)

## 2021-05-04 ENCOUNTER — Encounter: Payer: Self-pay | Admitting: Primary Care

## 2021-05-04 ENCOUNTER — Ambulatory Visit (INDEPENDENT_AMBULATORY_CARE_PROVIDER_SITE_OTHER): Payer: BC Managed Care – PPO | Admitting: Primary Care

## 2021-05-04 ENCOUNTER — Other Ambulatory Visit: Payer: Self-pay

## 2021-05-04 DIAGNOSIS — R911 Solitary pulmonary nodule: Secondary | ICD-10-CM | POA: Diagnosis not present

## 2021-05-04 DIAGNOSIS — Z9989 Dependence on other enabling machines and devices: Secondary | ICD-10-CM | POA: Diagnosis not present

## 2021-05-04 DIAGNOSIS — G4733 Obstructive sleep apnea (adult) (pediatric): Secondary | ICD-10-CM | POA: Diagnosis not present

## 2021-05-04 NOTE — Progress Notes (Addendum)
@Patient  ID: Brendan Gregory, male    DOB: [birth year], 41 y.o.   MRN: [id number]  Chief Complaint  Patient presents with   Follow-up    Referring provider: Horald Pollen, *  HPI: 41 year old male, never smoked.  Past medical history significant for HTN, obstructive sleep apnea on CPAP.  Patient of Dr. Ander Slade, last seen in office on 09/16/2019.  05/04/2021- interim hx  Patient presents today for overdue annual follow-up for OSA.  He was diagnosed with sleep apnea approximately 5 years ago and has been on CPAP since. Reports that he is sleeping well. Cpap machine is no longer working, leaking large amount of air from tubing attachment/holder. This happened two nights ago. Typical bedtime is between 10 PM and 12 AM and he starts his day between 6 and 8 AM.  He gets CPAP supplies through [employer]. Still snoring. He has lost 40-50lbs since last visit. Epworth score 6.   Patient had CT cardiac scoring on October 2022 that showed incidental 53mm pulmonary nodule in the interior lingula, he was a previously [place] smoker. No respiratory symptoms. No cough or hemoptysis.   Airview download 02/02/21-05/02/21 Usage 87/90 days (97%); 92% > 4 hours Average usage 6 hours 55 mins Pressure 16 cm h20 Airleaks 59.2L/min (95%) AHI 0.8   No Known Allergies  Immunization History  Administered Date(s) Administered   Moderna Sars-Covid-2 Vaccination 07/14/2019, 08/14/2019    Past Medical History:  Diagnosis Date   Agatston coronary artery calcium score less than 100    Ca score 1.  No CAD on coronary CTA   Hypertension    Panic attacks     Tobacco History: Social History   Tobacco Use  Smoking Status Never  Smokeless Tobacco Never   Counseling given: Not Answered   Outpatient Medications Prior to Visit  Medication Sig Dispense Refill   atorvastatin (LIPITOR) 10 MG tablet Take 1 tablet (10 mg total) by mouth daily. 90 tablet 3   Cholecalciferol (VITAMIN D3 PO) Take 5,000 Units by  mouth daily.     folic acid (FOLVITE) 696 MCG tablet Take 400 mcg by mouth daily.     magnesium gluconate (MAGONATE) 500 MG tablet Take 500 mg by mouth daily.     Omega-3 Fatty Acids (FISH OIL) 1000 MG CAPS Take by mouth daily.     OVER THE COUNTER MEDICATION Take 1 capsule by mouth daily at 6 (six) AM. Aged beet root     metoprolol tartrate (LOPRESSOR) 100 MG tablet Take 1 tablet (100 mg total) by mouth once for 1 dose. Take 1 tablet (100 mg total) two hours prior to CT scan. 1 tablet 0   valsartan-hydrochlorothiazide (DIOVAN-HCT) 320-25 MG tablet Take 1 tablet by mouth daily. 90 tablet 3   GRAPE SEED EXTRACT PO Take 2 capsules by mouth daily.     No facility-administered medications prior to visit.      Review of Systems  Review of Systems  Constitutional: Negative.   Respiratory:  Negative for cough, chest tightness, shortness of breath and wheezing.   Cardiovascular: Negative.   Psychiatric/Behavioral:  Negative for sleep disturbance.     Physical Exam  BP 118/80 (BP Location: Left Arm, Patient Position: Sitting, Cuff Size: Large)    Pulse 91    Temp 98.1 F (36.7 C) (Oral)    Ht 5\' 11"  (1.803 m)    Wt 291 lb 12.8 oz (132.4 kg)    SpO2 96%    BMI 40.70 kg/m  Physical Exam Constitutional:  General: He is not in acute distress.    Appearance: Normal appearance. He is not ill-appearing.  HENT:     Nose: Nose normal.     Mouth/Throat:     Mouth: Mucous membranes are moist.     Pharynx: Oropharynx is clear.  Cardiovascular:     Rate and Rhythm: Normal rate and regular rhythm.  Pulmonary:     Effort: Pulmonary effort is normal.     Breath sounds: Normal breath sounds. No wheezing, rhonchi or rales.  Musculoskeletal:        General: Normal range of motion.  Skin:    General: Skin is warm and dry.  Neurological:     General: No focal deficit present.     Mental Status: He is alert and oriented to person, place, and time. Mental status is at baseline.  Psychiatric:         Mood and Affect: Mood normal.        Behavior: Behavior normal.        Thought Content: Thought content normal.        Judgment: Judgment normal.     Lab Results:  CBC    Component Value Date/Time   WBC 9.7 11/24/2020 1540   RBC 4.58 11/24/2020 1540   HGB 13.9 11/24/2020 1540   HGB 15.0 08/19/2019 0822   HCT 40.8 11/24/2020 1540   HCT 45.0 08/19/2019 0822   PLT 294.0 11/24/2020 1540   PLT 321 08/19/2019 0822   MCV 89.1 11/24/2020 1540   MCV 93 08/19/2019 0822   MCH 31.1 08/19/2019 0822   MCHC 34.0 11/24/2020 1540   RDW 12.9 11/24/2020 1540   RDW 11.9 08/19/2019 0822   LYMPHSABS 3.0 11/24/2020 1540   LYMPHSABS 2.9 08/19/2019 0822   MONOABS 0.7 11/24/2020 1540   EOSABS 0.3 11/24/2020 1540   EOSABS 0.2 08/19/2019 0822   BASOSABS 0.1 11/24/2020 1540   BASOSABS 0.1 08/19/2019 0822    BMET    Component Value Date/Time   NA 137 02/10/2021 0000   K 4.0 02/10/2021 0000   CL 95 (L) 02/10/2021 0000   CO2 23 02/10/2021 0000   GLUCOSE 83 02/10/2021 0000   GLUCOSE 84 11/24/2020 1540   BUN 15 02/10/2021 0000   CREATININE 0.91 02/10/2021 0000   CALCIUM 9.7 02/10/2021 0000   GFRNONAA 80 08/19/2019 0822   GFRAA 92 08/19/2019 0822    BNP No results found for: BNP  ProBNP No results found for: PROBNP  Imaging: No results found.   Assessment & Plan:   Obstructive sleep apnea on CPAP - Dx with sleep apnea > 5 years ago, he has been on CPAP consistently since. Machine is not working, he is experiencing a large amount of airleaks. Airview download showed patient is 92% compliant with use > 4 hours over the last 90 days. Current pressure 16cm h20 with residual AHI 0.3. DME company is [employer], placed order to have them service machine. Need to obtain copy of previous sleep study before we are able to get new CPAP.   Solitary pulmonary nodule - Patient had CT cardiac scoring on October 2022 that showed incidental 86mm pulmonary nodule in the interior lingula. He was a  previous marijuana smoker. Needs follow-up imaging in 18 month to ensure stability.    Martyn Ehrich, NP 05/06/2021

## 2021-05-04 NOTE — Assessment & Plan Note (Addendum)
-   Dx with sleep apnea > 5 years ago, he has been on CPAP consistently since. Machine is not working, he is experiencing a large amount of airleaks. Airview download showed patient is 92% compliant with use > 4 hours over the last 90 days. Current pressure 16cm h20 with residual AHI 0.3. DME company is [employer], placed order to have them service machine. Need to obtain copy of previous sleep study before we are able to get new CPAP.

## 2021-05-04 NOTE — Assessment & Plan Note (Addendum)
-   Patient had CT cardiac scoring on October 2022 that showed incidental 72mm pulmonary nodule in the interior lingula. He was a previous marijuana smoker. Needs follow-up imaging in 18 month to ensure stability.

## 2021-05-04 NOTE — Patient Instructions (Addendum)
Need to obtain copy of previous sleep study and most recent compliant report before we are able to get new CPAP machine. Continue to wear CPAP every night for 4-6 hours or longer. Do not drive if experiencing excessive daytime sleepiness.   Orders: Please service current CPAP machine/ DME company [facility] Please get copy of patient sleep study originally done in [place] in 2016-2017  Follow-up: 1 year with Dr. Ander Slade or sooner if needed    CPAP and BIPAP Information CPAP and BIPAP are methods that use air pressure to keep your airways open and to help you breathe well. CPAP and BIPAP use different amounts of pressure. Your health care provider will tell you whether CPAP or BIPAP would be more helpful for you. CPAP stands for "continuous positive airway pressure." With CPAP, the amount of pressure stays the same while you breathe in (inhale) and out (exhale). BIPAP stands for "bi-level positive airway pressure." With BIPAP, the amount of pressure will be higher when you inhale and lower when you exhale. This allows you to take larger breaths. CPAP or BIPAP may be used in the hospital, or your health care provider may want you to use it at home. You may need to have a sleep study before your health care provider can order a machine for you to use at home. What are the advantages? CPAP or BIPAP can be helpful if you have: Sleep apnea. Chronic obstructive pulmonary disease (COPD). Heart failure. Medical conditions that cause muscle weakness, including muscular dystrophy or amyotrophic lateral sclerosis (ALS). Other problems that cause breathing to be shallow, weak, abnormal, or difficult. CPAP and BIPAP are most commonly used for obstructive sleep apnea (OSA) to keep the airways from collapsing when the muscles relax during sleep. What are the risks? Generally, this is a safe treatment. However, problems may occur, including: Irritated skin or skin sores if the mask does not fit  properly. Dry or stuffy nose or nosebleeds. Dry mouth. Feeling gassy or bloated. Sinus or lung infection if the equipment is not cleaned properly. When should CPAP or BIPAP be used? In most cases, the mask only needs to be worn during sleep. Generally, the mask needs to be worn throughout the night and during any daytime naps. People with certain medical conditions may also need to wear the mask at other times, such as when they are awake. Follow instructions from your health care provider about when to use the machine. What happens during CPAP or BIPAP? Both CPAP and BIPAP are provided by a small machine with a flexible plastic tube that attaches to a plastic mask that you wear. Air is blown through the mask into your nose or mouth. The amount of pressure that is used to blow the air can be adjusted on the machine. Your health care provider will set the pressure setting and help you find the best mask for you. Tips for using the mask Because the mask needs to be snug, some people feel trapped or closed-in (claustrophobic) when first using the mask. If you feel this way, you may need to get used to the mask. One way to do this is to hold the mask loosely over your nose or mouth and then gradually apply the mask more snugly. You can also gradually increase the amount of time that you use the mask. Masks are available in various types and sizes. If your mask does not fit well, talk with your health care provider about getting a different one. Some common types  of masks include: Full face masks, which fit over the mouth and nose. Nasal masks, which fit over the nose. Nasal pillow or prong masks, which fit into the nostrils. If you are using a mask that fits over your nose and you tend to breathe through your mouth, a chin strap may be applied to help keep your mouth closed. Use a skin barrier to protect your skin as told by your health care provider. Some CPAP and BIPAP machines have alarms that may  sound if the mask comes off or develops a leak. If you have trouble with the mask, it is very important that you talk with your health care provider about finding a way to make the mask easier to tolerate. Do not stop using the mask. There could be a negative impact on your health if you stop using the mask. Tips for using the machine Place your CPAP or BIPAP machine on a secure table or stand near an electrical outlet. Know where the on/off switch is on the machine. Follow instructions from your health care provider about how to set the pressure on your machine and when you should use it. Do not eat or drink while the CPAP or BIPAP machine is on. Food or fluids could get pushed into your lungs by the pressure of the CPAP or BIPAP. For home use, CPAP and BIPAP machines can be rented or purchased through home health care companies. Many different brands of machines are available. Renting a machine before purchasing may help you find out which particular machine works well for you. Your health insurance company may also decide which machine you may get. Keep the CPAP or BIPAP machine and attachments clean. Ask your health care provider for specific instructions. Check the humidifier if you have a dry stuffy nose or nosebleeds. Make sure it is working correctly. Follow these instructions at home: Take over-the-counter and prescription medicines only as told by your health care provider. Ask if you can take sinus medicine if your sinuses are blocked. Do not use any products that contain nicotine or tobacco. These products include cigarettes, chewing tobacco, and vaping devices, such as e-cigarettes. If you need help quitting, ask your health care provider. Keep all follow-up visits. This is important. Contact a health care provider if: You have redness or pressure sores on your head, face, mouth, or nose from the mask or head gear. You have trouble using the CPAP or BIPAP machine. You cannot tolerate  wearing the CPAP or BIPAP mask. Someone tells you that you snore even when wearing your CPAP or BIPAP. Get help right away if: You have trouble breathing. You feel confused. Summary CPAP and BIPAP are methods that use air pressure to keep your airways open and to help you breathe well. If you have trouble with the mask, it is very important that you talk with your health care provider about finding a way to make the mask easier to tolerate. Do not stop using the mask. There could be a negative impact to your health if you stop using the mask. Follow instructions from your health care provider about when to use the machine. This information is not intended to replace advice given to you by your health care provider. Make sure you discuss any questions you have with your health care provider. Document Revised: 10/28/2020 Document Reviewed: 02/28/2020 Elsevier Patient Education  2022 [employer].

## 2021-05-06 NOTE — Addendum Note (Signed)
Addended by: Martyn Ehrich on: 05/06/2021 10:10 AM   Modules accepted: Orders

## 2021-05-21 ENCOUNTER — Telehealth: Payer: Self-pay | Admitting: Primary Care

## 2021-05-21 DIAGNOSIS — G4733 Obstructive sleep apnea (adult) (pediatric): Secondary | ICD-10-CM

## 2021-05-21 NOTE — Telephone Encounter (Signed)
Left message for patient to call back. Patient was asked to obtain a copy of his last sleep study and do not see any documentation that we have received it.

## 2021-05-26 NOTE — Telephone Encounter (Signed)
Called and spoke with patient to let him know that Beth reviewed previous sleep study and said it was ok for [place] to place order for new CPAP to [facility]. Advised him that once they got the order they should contact him. Patient expressed understanding. Nothing further needed at this time.

## 2021-05-26 NOTE — Telephone Encounter (Signed)
Patient dropped off copy of sleep study and has been given to beth. Will forward to her

## 2021-05-26 NOTE — Telephone Encounter (Signed)
HST 01/19/15 showed severe OSA, total AHI 31.3. He had titration study on 04/06/15 >> At presure 14cm h20 AHI was reduced to 4/9/hr/ Recommend pressure 16cm h20. 90 day download showed patient is 92% compliant with CPAP use > 4 hours. Current pressure 16cm h20.    Please place an order for new CPAP machine with [facility], and let patient know. We will fax and scan copy of sleep study (placed in outbox)

## 2021-05-27 ENCOUNTER — Other Ambulatory Visit: Payer: Self-pay

## 2021-05-31 DIAGNOSIS — G4733 Obstructive sleep apnea (adult) (pediatric): Secondary | ICD-10-CM | POA: Diagnosis not present

## 2021-07-01 ENCOUNTER — Ambulatory Visit: Payer: BC Managed Care – PPO | Admitting: Emergency Medicine

## 2021-07-22 DIAGNOSIS — G4733 Obstructive sleep apnea (adult) (pediatric): Secondary | ICD-10-CM | POA: Diagnosis not present

## 2021-09-02 ENCOUNTER — Ambulatory Visit (INDEPENDENT_AMBULATORY_CARE_PROVIDER_SITE_OTHER): Payer: BC Managed Care – PPO

## 2021-09-02 ENCOUNTER — Ambulatory Visit: Payer: BC Managed Care – PPO | Admitting: Emergency Medicine

## 2021-09-02 ENCOUNTER — Encounter: Payer: Self-pay | Admitting: Emergency Medicine

## 2021-09-02 VITALS — BP 120/78 | HR 88 | Temp 97.9°F | Ht 71.0 in | Wt 298.1 lb

## 2021-09-02 DIAGNOSIS — M25562 Pain in left knee: Secondary | ICD-10-CM | POA: Diagnosis not present

## 2021-09-02 DIAGNOSIS — I10 Essential (primary) hypertension: Secondary | ICD-10-CM

## 2021-09-02 DIAGNOSIS — G4733 Obstructive sleep apnea (adult) (pediatric): Secondary | ICD-10-CM

## 2021-09-02 DIAGNOSIS — R079 Chest pain, unspecified: Secondary | ICD-10-CM

## 2021-09-02 DIAGNOSIS — Z9989 Dependence on other enabling machines and devices: Secondary | ICD-10-CM

## 2021-09-02 MED ORDER — VALSARTAN-HYDROCHLOROTHIAZIDE 320-25 MG PO TABS: 1.0000 | ORAL_TABLET | Freq: Every day | ORAL | 3 refills | Status: DC

## 2021-09-02 NOTE — Assessment & Plan Note (Signed)
We will get x-ray today. Referral to sports medicine. Advised to take Tylenol and or NSAIDs as needed for pain. Differential diagnosis discussed.

## 2021-09-02 NOTE — Patient Instructions (Signed)
Acute Knee Pain, Adult Many things can cause knee pain. Sometimes, knee pain is sudden (acute) and may be caused by damage, swelling, or irritation of the muscles and tissues that support your knee. The pain often goes away on its own with time and rest. If the pain does not go away, tests may be done to find out what is causing the pain. Follow these instructions at home: If you have a knee sleeve or brace:  Wear the knee sleeve or brace as told by your doctor. Take it off only as told by your doctor. Loosen it if your toes: Tingle. Become numb. Turn cold and blue. Keep it clean. If the knee sleeve or brace is not waterproof: Do not let it get wet. Cover it with a watertight covering when you take a bath or shower. Activity Rest your knee. Do not do things that cause pain or make pain worse. Avoid activities where both feet leave the ground at the same time (high-impact activities). Examples are running, jumping rope, and doing jumping jacks. Work with a physical therapist to make a safe exercise program, as told by your doctor. Managing pain, stiffness, and swelling  If told, put ice on the knee. To do this: If you have a removable knee sleeve or brace, take it off as told by your doctor. Put ice in a plastic bag. Place a towel between your skin and the bag. Leave the ice on for 20 minutes, 2-3 times a day. Take off the ice if your skin turns bright red. This is very important. If you cannot feel pain, heat, or cold, you have a greater risk of damage to the area. If told, use an elastic bandage to put pressure (compression) on your injured knee. Raise your knee above the level of your heart while you are sitting or lying down. Sleep with a pillow under your knee. General instructions Take over-the-counter and prescription medicines only as told by your doctor. Do not smoke or use any products that contain nicotine or tobacco. If you need help quitting, ask your doctor. If you are  overweight, work with your doctor and a food expert (dietitian) to set goals to lose weight. Being overweight can make your knee hurt more. Watch for any changes in your symptoms. Keep all follow-up visits. Contact a doctor if: The knee pain does not stop. The knee pain changes or gets worse. You have a fever along with knee pain. Your knee is red or feels warm when you touch it. Your knee gives out or locks up. Get help right away if: Your knee swells, and the swelling gets worse. You cannot move your knee. You have very bad knee pain that does not get better with pain medicine. Summary Many things can cause knee pain. The pain often goes away on its own with time and rest. Your doctor may do tests to find out the cause of the pain. Watch for any changes in your symptoms. Relieve your pain with rest, medicines, light activity, and use of ice. Get help right away if you cannot move your knee or your knee pain is very bad. This information is not intended to replace advice given to you by your health care provider. Make sure you discuss any questions you have with your health care provider. Document Revised: 09/04/2019 Document Reviewed: 09/04/2019 Elsevier Patient Education  2023 Elsevier Inc.  

## 2021-09-02 NOTE — Assessment & Plan Note (Signed)
Well-controlled hypertension.  Continue valsartan-HCTZ 320-25 mg daily. BP Readings from Last 3 Encounters:  09/02/21 120/78  05/04/21 118/80  02/15/21 122/75  Dietary approaches to stop hypertension discussed.

## 2021-09-02 NOTE — Progress Notes (Signed)
Brendan Gregory 41 y.o.   Chief Complaint  Patient presents with   Knee Pain    Left knee pain nerve pain side of knee    HISTORY OF PRESENT ILLNESS: This is a 41 y.o. male complaining of pain to his left knee mostly when he kneels on it while bathing his daughter.  Noticed it several weeks ago.  Denies injury. No other complaints or medical concerns today. BP Readings from Last 3 Encounters:  09/02/21 120/78  05/04/21 118/80  02/15/21 122/75   Wt Readings from Last 3 Encounters:  09/02/21 298 lb 2 oz (135.2 kg)  05/04/21 291 lb 12.8 oz (132.4 kg)  01/22/21 297 lb 3.2 oz (134.8 kg)      Knee Pain     Prior to Admission medications   Medication Sig Start Date End Date Taking? Authorizing Provider  atorvastatin (LIPITOR) 10 MG tablet Take 1 tablet (10 mg total) by mouth daily. 02/11/21   Sueanne Margarita, MD  Cholecalciferol (VITAMIN D3 PO) Take 5,000 Units by mouth daily.    [provider]  folic acid (FOLVITE) 254 MCG tablet Take 400 mcg by mouth daily.    [provider]  magnesium gluconate (MAGONATE) 500 MG tablet Take 500 mg by mouth daily.    [provider]  metoprolol tartrate (LOPRESSOR) 100 MG tablet Take 1 tablet (100 mg total) by mouth once for 1 dose. Take 1 tablet (100 mg total) two hours prior to CT scan. 01/29/21 01/29/21  Sueanne Margarita, MD  Omega-3 Fatty Acids (FISH OIL) 1000 MG CAPS Take by mouth daily.    [provider]  OVER THE COUNTER MEDICATION Take 1 capsule by mouth daily at 6 (six) AM. Aged beet root    [provider]  valsartan-hydrochlorothiazide (DIOVAN-HCT) 320-25 MG tablet Take 1 tablet by mouth daily. 02/02/21 05/03/21  Horald Pollen, MD    No Known Allergies  Patient Active Problem List   Diagnosis Date Noted   Solitary pulmonary nodule 05/04/2021   Agatston coronary artery calcium score less than 100 01/25/2021   Nonspecific chest pain 11/24/2020   Morbid obesity ([place]) 11/24/2020    Obstructive sleep apnea on CPAP 05/16/2019   Essential hypertension 05/16/2019    Past Medical History:  Diagnosis Date   Agatston coronary artery calcium score less than 100    Ca score 1.  No CAD on coronary CTA   Hypertension    Panic attacks     No past surgical history on file.  Social History   Socioeconomic History   Marital status: Married    Spouse name: Not on file   Number of children: Not on file   Years of education: Not on file   Highest education level: Not on file  Occupational History   Not on file  Tobacco Use   Smoking status: Never   Smokeless tobacco: Never  Substance and Sexual Activity   Alcohol use: Not on file    Comment: occasionally-beer   Drug use: Yes    Frequency: 3.0 times per week    Types: Marijuana    Comment: per week   Sexual activity: Not on file  Other Topics Concern   Not on file  Social History Narrative   Not on file   Social Determinants of Health   Financial Resource Strain: Not on file  Food Insecurity: Not on file  Transportation Needs: Not on file  Physical Activity: Not on file  Stress: Not on file  Social Connections: Not on file  Intimate Partner Violence: Not on file    Family History  Problem Relation Age of Onset   Healthy Mother    Prostate cancer Father    Diabetes Father    Hypertension Father    Diabetes Brother    Hypertension Brother    Diabetes Maternal Grandfather      Review of Systems  Constitutional: Negative.  Negative for chills and fever.  HENT: Negative.  Negative for congestion and sore throat.   Respiratory: Negative.  Negative for cough and shortness of breath.   Cardiovascular: Negative.   Gastrointestinal: Negative.  Negative for abdominal pain, diarrhea, nausea and vomiting.  Genitourinary: Negative.  Negative for dysuria and hematuria.  Musculoskeletal:  Positive for joint pain (Left knee pain).  Skin: Negative.  Negative for rash.  Neurological:  Negative for dizziness  and headaches.  All other systems reviewed and are negative.  Today's Vitals   09/02/21 1037  BP: 120/78  Pulse: 88  Temp: 97.9 F (36.6 C)  TempSrc: Oral  SpO2: 93%  Weight: 298 lb 2 oz (135.2 kg)  Height: '5\' 11"'$  (1.803 m)   Body mass index is 41.58 kg/m.  Physical Exam Vitals reviewed.  Constitutional:      Appearance: Normal appearance.  HENT:     Head: Normocephalic.  Eyes:     Extraocular Movements: Extraocular movements intact.     Pupils: Pupils are equal, round, and reactive to light.  Cardiovascular:     Rate and Rhythm: Normal rate.  Pulmonary:     Effort: Pulmonary effort is normal.  Musculoskeletal:     Comments: Left knee: No swelling or ecchymosis.  No erythema.  Mild lateral tenderness.  Full range of motion.  Stable in flexion and extension.  Skin:    General: Skin is warm and dry.     Capillary Refill: Capillary refill takes less than 2 seconds.  Neurological:     General: No focal deficit present.     Mental Status: He is alert and oriented to person, place, and time.  Psychiatric:        Mood and Affect: Mood normal.        Behavior: Behavior normal.   DG Knee Complete 4 Views Left  Result Date: 09/02/2021 CLINICAL DATA:  Left knee pain. EXAM: LEFT KNEE - COMPLETE 4+ VIEW COMPARISON:  None Available. FINDINGS: No evidence of fracture, dislocation, or joint effusion. No evidence of arthropathy or other focal bone abnormality. Soft tissues are unremarkable. IMPRESSION: Negative. Electronically Signed   By: San Morelle M.D.   On: 09/02/2021 11:09     ASSESSMENT & PLAN: Problem List Items Addressed This Visit       Cardiovascular and Mediastinum   Essential hypertension    Well-controlled hypertension.  Continue valsartan-HCTZ 320-25 mg daily. BP Readings from Last 3 Encounters:  09/02/21 120/78  05/04/21 118/80  02/15/21 122/75  Dietary approaches to stop hypertension discussed.       Relevant Medications    valsartan-hydrochlorothiazide (DIOVAN-HCT) 320-25 MG tablet     Respiratory   Obstructive sleep apnea on CPAP    Stable.  On CPAP treatment.         Other   Nonspecific chest pain    Negative cardiac work-up. Normal CT chest with coronary calcium score Normal echocardiogram.       Morbid obesity (HCC)   Acute pain of left knee - Primary    We will get x-ray today. Referral to sports  medicine. Advised to take Tylenol and or NSAIDs as needed for pain. Differential diagnosis discussed.       Relevant Orders   DG Knee Complete 4 Views Left   Ambulatory referral to Sports Medicine   Patient Instructions  Acute Knee Pain, Adult Many things can cause knee pain. Sometimes, knee pain is sudden (acute) and may be caused by damage, swelling, or irritation of the muscles and tissues that support your knee. The pain often goes away on its own with time and rest. If the pain does not go away, tests may be done to find out what is causing the pain. Follow these instructions at home: If you have a knee sleeve or brace:  Wear the knee sleeve or brace as told by your doctor. Take it off only as told by your doctor. Loosen it if your toes: Tingle. Become numb. Turn cold and blue. Keep it clean. If the knee sleeve or brace is not waterproof: Do not let it get wet. Cover it with a watertight covering when you take a bath or shower. Activity Rest your knee. Do not do things that cause pain or make pain worse. Avoid activities where both feet leave the ground at the same time (high-impact activities). Examples are running, jumping rope, and doing jumping jacks. Work with a physical therapist to make a safe exercise program, as told by your doctor. Managing pain, stiffness, and swelling  If told, put ice on the knee. To do this: If you have a removable knee sleeve or brace, take it off as told by your doctor. Put ice in a plastic bag. Place a towel between your skin and the  bag. Leave the ice on for 20 minutes, 2-3 times a day. Take off the ice if your skin turns bright red. This is very important. If you cannot feel pain, heat, or cold, you have a greater risk of damage to the area. If told, use an elastic bandage to put pressure (compression) on your injured knee. Raise your knee above the level of your heart while you are sitting or lying down. Sleep with a pillow under your knee. General instructions Take over-the-counter and prescription medicines only as told by your doctor. Do not smoke or use any products that contain nicotine or tobacco. If you need help quitting, ask your doctor. If you are overweight, work with your doctor and a food expert (dietitian) to set goals to lose weight. Being overweight can make your knee hurt more. Watch for any changes in your symptoms. Keep all follow-up visits. Contact a doctor if: The knee pain does not stop. The knee pain changes or gets worse. You have a fever along with knee pain. Your knee is red or feels warm when you touch it. Your knee gives out or locks up. Get help right away if: Your knee swells, and the swelling gets worse. You cannot move your knee. You have very bad knee pain that does not get better with pain medicine. Summary Many things can cause knee pain. The pain often goes away on its own with time and rest. Your doctor may do tests to find out the cause of the pain. Watch for any changes in your symptoms. Relieve your pain with rest, medicines, light activity, and use of ice. Get help right away if you cannot move your knee or your knee pain is very bad. This information is not intended to replace advice given to you by your health care provider. Make sure  you discuss any questions you have with your health care provider. Document Revised: 09/04/2019 Document Reviewed: 09/04/2019 Elsevier Patient Education  [place], MD [facility]

## 2021-09-02 NOTE — Assessment & Plan Note (Signed)
Stable.  On CPAP treatment. 

## 2021-09-02 NOTE — Assessment & Plan Note (Signed)
Negative cardiac work-up. Normal CT chest with coronary calcium score Normal echocardiogram.

## 2021-09-06 NOTE — Progress Notes (Unsigned)
    Subjective:    CC: L knee pain  I, Molly Weber, LAT, ATC, am serving as scribe for Dr. Lynne Leader.  HPI: Pt is a 41 y/o male presenting w/ c/o L knee pain x few weeks w/ no known MOI.  He locates his pain to .  L knee swelling: L knee mechanical symptoms: Aggravating factors: Treatments tried:   Diagnostic testing: L knee XR- 09/02/21  Pertinent review of Systems: ***  Relevant historical information: ***   Objective:   There were no vitals filed for this visit. General: Well Developed, well nourished, and in no acute distress.   MSK: ***  Lab and Radiology Results No results found for this or any previous visit (from the past 72 hour(s)). DG Knee Complete 4 Views Left  Result Date: 09/02/2021 CLINICAL DATA:  Left knee pain. EXAM: LEFT KNEE - COMPLETE 4+ VIEW COMPARISON:  None Available. FINDINGS: No evidence of fracture, dislocation, or joint effusion. No evidence of arthropathy or other focal bone abnormality. Soft tissues are unremarkable. IMPRESSION: Negative. Electronically Signed   By: San Morelle M.D.   On: 09/02/2021 11:09      Impression and Recommendations:    Assessment and Plan: 41 y.o. male with ***.  PDMP not reviewed this encounter. No orders of the defined types were placed in this encounter.  No orders of the defined types were placed in this encounter.   Discussed warning signs or symptoms. Please see discharge instructions. Patient expresses understanding.   ***

## 2021-09-07 ENCOUNTER — Ambulatory Visit: Payer: Self-pay

## 2021-09-07 ENCOUNTER — Ambulatory Visit (INDEPENDENT_AMBULATORY_CARE_PROVIDER_SITE_OTHER): Payer: BC Managed Care – PPO | Admitting: Family Medicine

## 2021-09-07 VITALS — BP 142/86 | HR 95 | Ht 71.0 in | Wt 304.0 lb

## 2021-09-07 DIAGNOSIS — M25562 Pain in left knee: Secondary | ICD-10-CM

## 2021-09-07 NOTE — Patient Instructions (Addendum)
Thank you for coming in today.   I've referred you to Physical Therapy.  Let us know if you don't hear from them in one week.   Please use Voltaren gel (Generic Diclofenac Gel) up to 4x daily for pain as needed.  This is available over-the-counter as both the name brand Voltaren gel and the generic diclofenac gel.   Please complete the exercises that the athletic trainer went over with you:  View at [email or web address] using code: [email or web address]  Cushion the knee if you need to be kneeling on it  Check back in 8 weeks

## 2021-09-21 ENCOUNTER — Ambulatory Visit: Payer: BC Managed Care – PPO | Attending: Family Medicine

## 2021-09-21 DIAGNOSIS — M25562 Pain in left knee: Secondary | ICD-10-CM | POA: Diagnosis not present

## 2021-09-21 NOTE — Therapy (Signed)
OUTPATIENT PHYSICAL THERAPY LOWER EXTREMITY EVALUATION   Patient Name: Brendan Gregory MRN: [id number] DOB:07-Nov-1980, 41 y.o., male Today's Date: 09/21/2021   PT End of Session - 09/21/21 1637     Visit Number 1    Date for PT Re-Evaluation 10/26/21    PT Start Time 1638    PT Stop Time 4098    PT Time Calculation (min) 37 min    Activity Tolerance Patient tolerated treatment well    Behavior During Therapy WFL for tasks assessed/performed             Past Medical History:  Diagnosis Date   Agatston coronary artery calcium score less than 100    Ca score 1.  No CAD on coronary CTA   Hypertension    Panic attacks    History reviewed. No pertinent surgical history. Patient Active Problem List   Diagnosis Date Noted   Acute pain of left knee 09/02/2021   Solitary pulmonary nodule 05/04/2021   Agatston coronary artery calcium score less than 100 01/25/2021   Nonspecific chest pain 11/24/2020   Morbid obesity ([place]) 11/24/2020   Obstructive sleep apnea on CPAP 05/16/2019   Essential hypertension 05/16/2019    PCP: Agustina Caroli  REFERRING PROVIDER: Agustina Caroli  REFERRING DIAG: [phone]  THERAPY DIAG:  Acute pain of left knee  Rationale for Evaluation and Treatment Rehabilitation  ONSET DATE: 09/02/21  SUBJECTIVE:   SUBJECTIVE STATEMENT: L knee started having pain a month ago when giving daughter a bath and felt like the muscle was being ripped and torn. He has lost close to 60lbs in a year. No pain with walking and can squat with no pain. The pain comes with kneeling onto it and I feel numbness in L lateral leg that comes and goes. Touching it makes it feel like a "nerve is pulling" and only really has pain if he hits it against something or is kneeling on it and the pain gets up to a 7/10. Walks 1-1.5 miles almost every day.   PERTINENT HISTORY: HTN, obesity, anxiety   PAIN:  Are you having pain? No  PRECAUTIONS: None  WEIGHT BEARING RESTRICTIONS  No  FALLS:  Has patient fallen in last 6 months? No  LIVING ENVIRONMENT: Lives with: lives with their family Lives in: House/apartment Stairs: Yes: Internal: 3 flights steps; can reach both Has following equipment at home: None  OCCUPATION: remote IT work  PLOF: Independent  PATIENT GOALS not have the knee pain anymore,    OBJECTIVE:   DIAGNOSTIC FINDINGS: unremarkable findings, some joint space narrowing   PATIENT SURVEYS:  FOTO 77/100  COGNITION:  Overall cognitive status: Within functional limits for tasks assessed     SENSATION: WFL   PALPATION: TTP fibular head and patellar tendon   LOWER EXTREMITY ROM: all WNL, no pain in any direction  LOWER EXTREMITY MMT:  5/5 in BLE, no pain   FUNCTIONAL TESTS:  5 times sit to stand: 10.64s Timed up and go (TUG): 9.20s  GAIT: Distance walked: in clinic distances Assistive device utilized: None Level of assistance: Complete Independence Comments: walks without pain, no remarkable issues with gait   TODAY'S TREATMENT: POC    PATIENT EDUCATION:  Education details: POC Person educated: Patient Education method: Customer service manager Education comprehension: verbalized understanding   HOME EXERCISE PROGRAM: TBD  ASSESSMENT:  CLINICAL IMPRESSION: Patient is a 41 y.o. male who was seen today for physical therapy evaluation and treatment for L knee pain that started a month ago. He  describes the pain as a pulling sensation and says it is from the nerve pulling in his leg. He pinpoints his pain to L fibular head and only feels it if he is kneeling on it and if he pokes around and touches it and below his L knee. Only way to reproduce his pain was for him to apply direct pressure and kneel onto his L knee but he declined to show in the clinic because he would scream. He has full ROM and good strength in BLE and able to walk a mile and climb 3 set of stairs every day. He was educated to possibly see neuro for  a nerve conduction study and we can try iontophoresis and dexamethasone along with some general knee strengthening here in PT 1x/week to see if that helps. Due to clear reproduction of pain and unable to pin point what may be causing it, I consider this a moderate complexity case.    OBJECTIVE IMPAIRMENTS pain.   ACTIVITY LIMITATIONS bed mobility, caring for others, and bathing his daughter and getting up from floor sometimes  PERSONAL FACTORS 1-2 comorbidities: HTN and unable to reproduce pain  are also affecting patient's functional outcome.   REHAB POTENTIAL: Fair depending on if we can find the source of pain to treat  CLINICAL DECISION MAKING: Evolving/moderate complexity  EVALUATION COMPLEXITY: Moderate  GOALS: Goals reviewed with patient? No  SHORT TERM GOALS: Target date: 10/12/21  Patient will be independent with initial HEP. Goal status: INITIAL   LONG TERM GOALS: Target date: 10/26/21  1.  Patient will report at least 75% improvement in L knee pain to improve QOL. Goal status: INITIAL   2.  Patient will demonstrate improved functional LE movement to be able to kneel onto L knee without pain.  Baseline: 7/10 Goal status: INITIAL   3.  Patient will report 88 on FOTO (patient reported outcome measure) to demonstrate improved functional ability. Baseline: 77 Goal status: INITIAL    PLAN: PT FREQUENCY: 1-2x/week  PT DURATION: 4 weeks  PLANNED INTERVENTIONS: Therapeutic exercises, Therapeutic activity, Neuromuscular re-education, Balance training, Gait training, Patient/Family education, Joint mobilization, Dry Needling, Electrical stimulation, Cryotherapy, Moist heat, Taping, Vasopneumatic device, Ionotophoresis '4mg'$ /ml Dexamethasone, and Manual therapy  PLAN FOR NEXT SESSION: manual therapy STM, iontophoresis    Andris Baumann, PT 09/21/2021, 7:00 PM

## 2021-10-01 ENCOUNTER — Ambulatory Visit: Payer: BC Managed Care – PPO | Admitting: Physical Therapy

## 2021-10-01 DIAGNOSIS — M25562 Pain in left knee: Secondary | ICD-10-CM | POA: Diagnosis not present

## 2021-10-01 NOTE — Therapy (Signed)
OUTPATIENT PHYSICAL THERAPY LOWER EXTREMITY   Patient Name: Brendan Gregory MRN: [id number] DOB:[birth year], 41 y.o., male Today's Date: 10/01/2021   PT End of Session - 10/01/21 0853     Visit Number 2    Date for PT Re-Evaluation 10/26/21    PT Start Time 3532    PT Stop Time 0932    PT Time Calculation (min) 38 min             Past Medical History:  Diagnosis Date   Agatston coronary artery calcium score less than 100    Ca score 1.  No CAD on coronary CTA   Hypertension    Panic attacks    No past surgical history on file. Patient Active Problem List   Diagnosis Date Noted   Acute pain of left knee 09/02/2021   Solitary pulmonary nodule 05/04/2021   Agatston coronary artery calcium score less than 100 01/25/2021   Nonspecific chest pain 11/24/2020   Morbid obesity ([place]) 11/24/2020   Obstructive sleep apnea on CPAP 05/16/2019   Essential hypertension 05/16/2019    PCP: Agustina Caroli  REFERRING PROVIDER: Agustina Caroli  REFERRING DIAG: [phone]  THERAPY DIAG:  Acute pain of left knee  Rationale for Evaluation and Treatment Rehabilitation  ONSET DATE: 09/02/21  SUBJECTIVE:   SUBJECTIVE STATEMENT: Only pain with kneeling   PERTINENT HISTORY: HTN, obesity, anxiety   PAIN:  Are you having pain? No  PRECAUTIONS: None  WEIGHT BEARING RESTRICTIONS No  FALLS:  Has patient fallen in last 6 months? No  LIVING ENVIRONMENT: Lives with: lives with their family Lives in: House/apartment Stairs: Yes: Internal: 3 flights steps; can reach both Has following equipment at home: None  OCCUPATION: remote IT work  PLOF: Independent  PATIENT GOALS not have the knee pain anymore,    OBJECTIVE:  full left knee ROM, full squat painfree and 5/5 left knee MMT    TODAY'S TREATMENT: 10/01/21 Fibular head mobs- good ROM Patellar mobs - good motion and tracking, very tender over patellar tendon [place] to left patellar tendon 1.2 w/cm 2 100 % cont ( some left knee  jumping over lateral aspect) Ionto patch 1.1 cc dex 4 hour leave on patch left pat tendon- needed tape to hold in place to added KT tape to unload patellla    Eval-POC    PATIENT EDUCATION:  Education details: POC Person educated: Patient Education method: Customer service manager Education comprehension: verbalized understanding   HOME EXERCISE PROGRAM: TBD  ASSESSMENT:  CLINICAL IMPRESSION: pin point tender over pat tendon. Tolerated pat mobs and fib head mobs well. [place] and ionto to inflammation and see if helps pain  OBJECTIVE IMPAIRMENTS pain.   ACTIVITY LIMITATIONS bed mobility, caring for others, and bathing his daughter and getting up from floor sometimes  PERSONAL FACTORS 1-2 comorbidities: HTN and unable to reproduce pain  are also affecting patient's functional outcome.   REHAB POTENTIAL: Fair depending on if we can find the source of pain to treat  CLINICAL DECISION MAKING: Evolving/moderate complexity  EVALUATION COMPLEXITY: Moderate  GOALS: Goals reviewed with patient? No  SHORT TERM GOALS: Target date: 10/12/21  Patient will be independent with initial HEP. Goal status: INITIAL   LONG TERM GOALS: Target date: 10/26/21  1.  Patient will report at least 75% improvement in L knee pain to improve QOL. Goal status: INITIAL   2.  Patient will demonstrate improved functional LE movement to be able to kneel onto L knee without pain.  Baseline: 7/10 Goal status:  INITIAL   3.  Patient will report 46 on FOTO (patient reported outcome measure) to demonstrate improved functional ability. Baseline: 77 Goal status: INITIAL    PLAN: PT FREQUENCY: 1-2x/week  PT DURATION: 4 weeks  PLANNED INTERVENTIONS: Therapeutic exercises, Therapeutic activity, Neuromuscular re-education, Balance training, Gait training, Patient/Family education, Joint mobilization, Dry Needling, Electrical stimulation, Cryotherapy, Moist heat, Taping, Vasopneumatic device,  Ionotophoresis '4mg'$ /ml Dexamethasone, and Manual therapy  PLAN FOR NEXT SESSION: assess today tx and progress  Muhamad Serano,ANGIE, PTA 10/01/2021, 9:41 AM

## 2021-10-13 ENCOUNTER — Encounter: Payer: Self-pay | Admitting: Physical Therapy

## 2021-10-13 ENCOUNTER — Ambulatory Visit: Payer: BC Managed Care – PPO | Attending: Family Medicine | Admitting: Physical Therapy

## 2021-10-13 DIAGNOSIS — M6281 Muscle weakness (generalized): Secondary | ICD-10-CM | POA: Diagnosis not present

## 2021-10-13 DIAGNOSIS — R278 Other lack of coordination: Secondary | ICD-10-CM | POA: Diagnosis not present

## 2021-10-13 DIAGNOSIS — R2689 Other abnormalities of gait and mobility: Secondary | ICD-10-CM | POA: Insufficient documentation

## 2021-10-13 DIAGNOSIS — R293 Abnormal posture: Secondary | ICD-10-CM | POA: Diagnosis not present

## 2021-10-13 DIAGNOSIS — M25562 Pain in left knee: Secondary | ICD-10-CM | POA: Insufficient documentation

## 2021-10-13 DIAGNOSIS — R2681 Unsteadiness on feet: Secondary | ICD-10-CM | POA: Diagnosis not present

## 2021-10-13 DIAGNOSIS — R600 Localized edema: Secondary | ICD-10-CM | POA: Diagnosis not present

## 2021-10-13 DIAGNOSIS — R262 Difficulty in walking, not elsewhere classified: Secondary | ICD-10-CM | POA: Insufficient documentation

## 2021-10-13 NOTE — Therapy (Signed)
OUTPATIENT PHYSICAL THERAPY LOWER EXTREMITY   Patient Name: Brendan Gregory MRN: [id number] DOB:17-Jan-1981, 41 y.o., male Today's Date: 10/13/2021   PT End of Session - 10/13/21 1651     Visit Number 3    Date for PT Re-Evaluation 10/26/21    PT Start Time 1632    PT Stop Time 1712    PT Time Calculation (min) 40 min    Activity Tolerance Patient tolerated treatment well    Behavior During Therapy [facility] for tasks assessed/performed              Past Medical History:  Diagnosis Date   Agatston coronary artery calcium score less than 100    Ca score 1.  No CAD on coronary CTA   Hypertension    Panic attacks    History reviewed. No pertinent surgical history. Patient Active Problem List   Diagnosis Date Noted   Acute pain of left knee 09/02/2021   Solitary pulmonary nodule 05/04/2021   Agatston coronary artery calcium score less than 100 01/25/2021   Nonspecific chest pain 11/24/2020   Morbid obesity ([place]) 11/24/2020   Obstructive sleep apnea on CPAP 05/16/2019   Essential hypertension 05/16/2019    PCP: Agustina Caroli  REFERRING PROVIDER: Agustina Caroli  REFERRING DIAG: [phone]  THERAPY DIAG:  Acute pain of left knee  Rationale for Evaluation and Treatment Rehabilitation  ONSET DATE: 09/02/21  SUBJECTIVE:   SUBJECTIVE STATEMENT: Patient reports that he felt the [place] helped. He had some recovery after last treatment. He is unsure of the Ionto patch because the knee was hurting with the taping.  PERTINENT HISTORY: HTN, obesity, anxiety   PAIN:  Are you having pain? No  PRECAUTIONS: None  WEIGHT BEARING RESTRICTIONS No  FALLS:  Has patient fallen in last 6 months? No  LIVING ENVIRONMENT: Lives with: lives with their family Lives in: House/apartment Stairs: Yes: Internal: 3 flights steps; can reach both Has following equipment at home: None  OCCUPATION: remote IT work  PLOF: Independent  PATIENT GOALS not have the knee pain anymore,     OBJECTIVE:  full left knee ROM, full squat painfree and 5/5 left knee MMT    TODAY'S TREATMENT:  10/13/21 Nu-Step L5 x 6 minutes L TKE against ball in stand, 2 x 10 reps Knee flexion B 45#, 15 reps, had to stop due to pain. Knee ext with slow release, LLE 15#, 2 x 10 reps. [place] to patellar tendon and ant/lateral joint line 7 min, 1.2 w/cm2. Ionto patch  10/01/21 Fibular head mobs- good ROM Patellar mobs - good motion and tracking, very tender over patellar tendon [place] to left patellar tendon 1.2 w/cm 2 100 % cont ( some left knee jumping over lateral aspect) Ionto patch 1.1 cc dex 4 hour leave on patch left pat tendon- needed tape to hold in place to added KT tape to unload patellla    Eval-POC    PATIENT EDUCATION:  Education details: POC Person educated: Patient Education method: Customer service manager Education comprehension: verbalized understanding   HOME EXERCISE PROGRAM: TBD  ASSESSMENT:  CLINICAL IMPRESSION:  Patient reports that he feels the [place] helped, the taping caused his patella to feel pressure, so he removed it. Initiated some strength training today to build support in knee. Resisted knee flexion aggravated his knee, but extension felt better.  OBJECTIVE IMPAIRMENTS pain.   ACTIVITY LIMITATIONS bed mobility, caring for others, and bathing his daughter and getting up from floor sometimes  PERSONAL FACTORS 1-2 comorbidities: HTN and  unable to reproduce pain  are also affecting patient's functional outcome.   REHAB POTENTIAL: Fair depending on if we can find the source of pain to treat  CLINICAL DECISION MAKING: Evolving/moderate complexity  EVALUATION COMPLEXITY: Moderate  GOALS: Goals reviewed with patient? No  SHORT TERM GOALS: Target date: 10/12/21  Patient will be independent with initial HEP. Goal status: INITIAL   LONG TERM GOALS: Target date: 10/26/21  1.  Patient will report at least 75% improvement in L knee pain to improve  QOL. Goal status: INITIAL   2.  Patient will demonstrate improved functional LE movement to be able to kneel onto L knee without pain.  Baseline: 7/10 Goal status: INITIAL   3.  Patient will report 55 on FOTO (patient reported outcome measure) to demonstrate improved functional ability. Baseline: 77 Goal status: INITIAL    PLAN: PT FREQUENCY: 1-2x/week  PT DURATION: 4 weeks  PLANNED INTERVENTIONS: Therapeutic exercises, Therapeutic activity, Neuromuscular re-education, Balance training, Gait training, Patient/Family education, Joint mobilization, Dry Needling, Electrical stimulation, Cryotherapy, Moist heat, Taping, Vasopneumatic device, Ionotophoresis '4mg'$ /ml Dexamethasone, and Manual therapy  PLAN FOR NEXT SESSION: assess today tx and progress  Marcelina Morel, DPT 10/13/2021, 5:24 PM

## 2021-10-18 NOTE — Progress Notes (Deleted)
   I, Wendy Poet, LAT, ATC, am serving as scribe for Dr. Lynne Leader.  Brendan Gregory is a 41 y.o. male who presents to [place] at [facility] today for f/u of L ant-lat knee pain.  He was last seen by Dr. Georgina Snell on 09/07/21 and was provided a HEP focusing on eccentric ankle DF exercises.  He was also referred to PT and has completed 3 visits.   Pertinent review of systems: ***  Relevant historical information: ***   Exam:  There were no vitals taken for this visit. General: Well Developed, well nourished, and in no acute distress.   MSK: ***    Lab and Radiology Results No results found for this or any previous visit (from the past 72 hour(s)). No results found.     Assessment and Plan: 41 y.o. male with ***   PDMP not reviewed this encounter. No orders of the defined types were placed in this encounter.  No orders of the defined types were placed in this encounter.    Discussed warning signs or symptoms. Please see discharge instructions. Patient expresses understanding.   ***

## 2021-10-19 ENCOUNTER — Ambulatory Visit: Payer: BC Managed Care – PPO | Admitting: Physical Therapy

## 2021-10-19 ENCOUNTER — Ambulatory Visit: Payer: BC Managed Care – PPO | Admitting: Family Medicine

## 2021-10-19 ENCOUNTER — Encounter: Payer: Self-pay | Admitting: Physical Therapy

## 2021-10-19 DIAGNOSIS — M25562 Pain in left knee: Secondary | ICD-10-CM

## 2021-10-19 DIAGNOSIS — R600 Localized edema: Secondary | ICD-10-CM | POA: Diagnosis not present

## 2021-10-19 DIAGNOSIS — R2689 Other abnormalities of gait and mobility: Secondary | ICD-10-CM | POA: Diagnosis not present

## 2021-10-19 DIAGNOSIS — R262 Difficulty in walking, not elsewhere classified: Secondary | ICD-10-CM | POA: Diagnosis not present

## 2021-10-19 DIAGNOSIS — R278 Other lack of coordination: Secondary | ICD-10-CM | POA: Diagnosis not present

## 2021-10-19 DIAGNOSIS — M6281 Muscle weakness (generalized): Secondary | ICD-10-CM | POA: Diagnosis not present

## 2021-10-19 DIAGNOSIS — R2681 Unsteadiness on feet: Secondary | ICD-10-CM | POA: Diagnosis not present

## 2021-10-19 DIAGNOSIS — R293 Abnormal posture: Secondary | ICD-10-CM | POA: Diagnosis not present

## 2021-10-19 NOTE — Progress Notes (Deleted)
   I, Wendy Poet, LAT, ATC, am serving as scribe for Dr. Lynne Leader.  Brendan Gregory is a 41 y.o. male who presents to [place] at [facility] today for f/u of L knee pain.  He was last seen by Dr. Georgina Snell on 09/07/21 and was shown a HEP focusing on ankle DF eccentrics and advised to use Voltaren gel.  He was also referred to PT of which he's completed 3 visits.  Today, pt reports  Diagnostic testing: L knee XR- 09/07/21  Pertinent review of systems: ***  Relevant historical information: ***   Exam:  There were no vitals taken for this visit. General: Well Developed, well nourished, and in no acute distress.   MSK: ***    Lab and Radiology Results No results found for this or any previous visit (from the past 72 hour(s)). No results found.     Assessment and Plan: 41 y.o. male with ***   PDMP not reviewed this encounter. No orders of the defined types were placed in this encounter.  No orders of the defined types were placed in this encounter.    Discussed warning signs or symptoms. Please see discharge instructions. Patient expresses understanding.   ***

## 2021-10-19 NOTE — Therapy (Signed)
OUTPATIENT PHYSICAL THERAPY LOWER EXTREMITY   Patient Name: Brendan Gregory MRN: [id number] DOB:11-29-1980, 41 y.o., male Today's Date: 10/19/2021   PT End of Session - 10/19/21 0951     Visit Number 4    Date for PT Re-Evaluation 10/26/21    PT Start Time 0933    PT Stop Time 1011    PT Time Calculation (min) 38 min    Activity Tolerance Patient tolerated treatment well    Behavior During Therapy [facility] for tasks assessed/performed               Past Medical History:  Diagnosis Date   Agatston coronary artery calcium score less than 100    Ca score 1.  No CAD on coronary CTA   Hypertension    Panic attacks    History reviewed. No pertinent surgical history. Patient Active Problem List   Diagnosis Date Noted   Acute pain of left knee 09/02/2021   Solitary pulmonary nodule 05/04/2021   Agatston coronary artery calcium score less than 100 01/25/2021   Nonspecific chest pain 11/24/2020   Morbid obesity ([place]) 11/24/2020   Obstructive sleep apnea on CPAP 05/16/2019   Essential hypertension 05/16/2019    PCP: Agustina Caroli  REFERRING PROVIDER: Agustina Caroli  REFERRING DIAG: [phone]  THERAPY DIAG:  Acute pain of left knee  Rationale for Evaluation and Treatment Rehabilitation  ONSET DATE: 09/02/21  SUBJECTIVE:   SUBJECTIVE STATEMENT: Patient reports that he had some soreness after last treatment, lasted about an hour, then started to resolve and was better the next day.  PERTINENT HISTORY: HTN, obesity, anxiety   PAIN:  Are you having pain? No  PRECAUTIONS: None  WEIGHT BEARING RESTRICTIONS No  FALLS:  Has patient fallen in last 6 months? No  LIVING ENVIRONMENT: Lives with: lives with their family Lives in: House/apartment Stairs: Yes: Internal: 3 flights steps; can reach both Has following equipment at home: None  OCCUPATION: remote IT work  PLOF: Independent  PATIENT GOALS not have the knee pain anymore,    OBJECTIVE:  full left knee  ROM, full squat painfree and 5/5 left knee MMT    TODAY'S TREATMENT:  10/19/21 Recumbent bike L4 x 6 minutes. Patellar assessment-L patellar compression test pos, also C/O pain in patellar tendon and sup/lat to patella with patellar depression. Supine SLR, 5# 1 x 10 reps, ER with 3#, 2 x 10 reps. Cybex knee flexion B 25#, 2 x 10, knee ext L 10# 2 x 10 reps. TKE with ball 2 x 10 reps Sit <> stand with Gr theraband resistance in hip abd, 10 reps, Side to side step with green Tband resistance 10 reps [place] @ 1.2 w/cm to L knee around sup, lat, inf kneecap, including patellar tendon. Declined ionto today  10/13/21 Nu-Step L5 x 6 minutes L TKE against ball in stand, 2 x 10 reps Knee flexion B 45#, 15 reps, had to stop due to pain. Knee ext with slow release, LLE 15#, 2 x 10 reps. [place] to patellar tendon and ant/lateral joint line 7 min, 1.2 w/cm2. Ionto patch  10/01/21 Fibular head mobs- good ROM Patellar mobs - good motion and tracking, very tender over patellar tendon [place] to left patellar tendon 1.2 w/cm 2 100 % cont ( some left knee jumping over lateral aspect) Ionto patch 1.1 cc dex 4 hour leave on patch left pat tendon- needed tape to hold in place to added KT tape to unload patellla    Eval-POC    PATIENT  EDUCATION:  Education details: POC Person educated: Patient Education method: Customer service manager Education comprehension: verbalized understanding   HOME EXERCISE PROGRAM: TBD  ASSESSMENT:  CLINICAL IMPRESSION:  Patient reports that he feels the [place] helped, the taping caused his patella to feel pressure, so he removed it. Initiated some strength training today to build support in knee. Resisted knee flexion aggravated his knee, but extension felt better.  OBJECTIVE IMPAIRMENTS pain.   ACTIVITY LIMITATIONS bed mobility, caring for others, and bathing his daughter and getting up from floor sometimes  PERSONAL FACTORS 1-2 comorbidities: HTN and unable to reproduce  pain  are also affecting patient's functional outcome.   REHAB POTENTIAL: Fair depending on if we can find the source of pain to treat  CLINICAL DECISION MAKING: Evolving/moderate complexity  EVALUATION COMPLEXITY: Moderate  GOALS: Goals reviewed with patient? No  SHORT TERM GOALS: Target date: 10/12/21  Patient will be independent with initial HEP. Goal status: ongoing   LONG TERM GOALS: Target date: 10/26/21  1.  Patient will report at least 75% improvement in L knee pain to improve QOL. Goal status: INITIAL   2.  Patient will demonstrate improved functional LE movement to be able to kneel onto L knee without pain.  Baseline: 7/10 Goal status: INITIAL   3.  Patient will report 87 on FOTO (patient reported outcome measure) to demonstrate improved functional ability. Baseline: 77 Goal status: INITIAL    PLAN: PT FREQUENCY: 1-2x/week  PT DURATION: 4 weeks  PLANNED INTERVENTIONS: Therapeutic exercises, Therapeutic activity, Neuromuscular re-education, Balance training, Gait training, Patient/Family education, Joint mobilization, Dry Needling, Electrical stimulation, Cryotherapy, Moist heat, Taping, Vasopneumatic device, Ionotophoresis '4mg'$ /ml Dexamethasone, and Manual therapy  HEP  [id number]  PLAN FOR NEXT SESSION: See how patient tolerated Treatment and HEP  Marcelina Morel, DPT 10/19/2021, 10:31 AM

## 2021-10-20 ENCOUNTER — Ambulatory Visit: Payer: BC Managed Care – PPO | Admitting: Family Medicine

## 2021-10-20 DIAGNOSIS — G4733 Obstructive sleep apnea (adult) (pediatric): Secondary | ICD-10-CM | POA: Diagnosis not present

## 2021-10-24 ENCOUNTER — Telehealth: Payer: BC Managed Care – PPO | Admitting: Nurse Practitioner

## 2021-10-24 DIAGNOSIS — F419 Anxiety disorder, unspecified: Secondary | ICD-10-CM | POA: Diagnosis not present

## 2021-10-24 NOTE — Patient Instructions (Signed)
  [name], thank you for joining [name], NP for today's virtual visit.  While this provider is not your primary care provider (PCP), if your PCP is located in our provider database this encounter information will be shared with them immediately following your visit.  Consent: (Patient) Brendan Gregory provided verbal consent for this virtual visit at the beginning of the encounter.  Current Medications:  Current Outpatient Medications:    atorvastatin (LIPITOR) 10 MG tablet, Take 1 tablet (10 mg total) by mouth daily., Disp: 90 tablet, Rfl: 3   Cholecalciferol (VITAMIN D3 PO), Take 5,000 Units by mouth daily., Disp: , Rfl:    folic acid (FOLVITE) 536 MCG tablet, Take 400 mcg by mouth daily., Disp: , Rfl:    magnesium gluconate (MAGONATE) 500 MG tablet, Take 500 mg by mouth daily., Disp: , Rfl:    metoprolol tartrate (LOPRESSOR) 100 MG tablet, Take 1 tablet (100 mg total) by mouth once for 1 dose. Take 1 tablet (100 mg total) two hours prior to CT scan., Disp: 1 tablet, Rfl: 0   Omega-3 Fatty Acids (FISH OIL) 1000 MG CAPS, Take by mouth daily., Disp: , Rfl:    OVER THE COUNTER MEDICATION, Take 1 capsule by mouth daily at 6 (six) AM. Aged beet root, Disp: , Rfl:    valsartan-hydrochlorothiazide (DIOVAN-HCT) 320-25 MG tablet, Take 1 tablet by mouth daily., Disp: 90 tablet, Rfl: 3   Medications ordered in this encounter:  No orders of the defined types were placed in this encounter.    *If you need refills on other medications prior to your next appointment, please contact your pharmacy*  Follow-Up: Call back or seek an in-person evaluation if the symptoms worsen or if the condition fails to improve as anticipated.  Other Instructions Follow up with PCP for GAD7/PHQ9/SSRI and anxiolytic prescribing if needed.  May take cyclobenzaprine for chest discomfort   If you have been instructed to have an in-person evaluation today at a local Urgent Care facility, please use the link  below. It will take you to a list of all of our available [facility], including address, phone number and hours of operation. Please do not delay care.  [facility]  If you or a family member do not have a primary care provider, use the link below to schedule a visit and establish care. When you choose a [facility] primary care physician or advanced practice provider, you gain a long-term partner in health. Find a Primary Care Provider  Learn more about [facility]'s in-office and virtual care options: [facility]

## 2021-10-24 NOTE — Progress Notes (Signed)
Virtual Visit Consent   Brendan Gregory, you are scheduled for a virtual visit with a [facility] provider today. Just as with appointments in the office, your consent must be obtained to participate. Your consent will be active for this visit and any virtual visit you may have with one of our providers in the next 365 days. If you have a MyChart account, a copy of this consent can be sent to you electronically.  As this is a virtual visit, video technology does not allow for your provider to perform a traditional examination. This may limit your provider's ability to fully assess your condition. If your provider identifies any concerns that need to be evaluated in person or the need to arrange testing (such as labs, EKG, etc.), we will make arrangements to do so. Although advances in technology are sophisticated, we cannot ensure that it will always work on either your end or our end. If the connection with a video visit is poor, the visit may have to be switched to a telephone visit. With either a video or telephone visit, we are not always able to ensure that we have a secure connection.  By engaging in this virtual visit, you consent to the provision of healthcare and authorize for your insurance to be billed (if applicable) for the services provided during this visit. Depending on your insurance coverage, you may receive a charge related to this service.  I need to obtain your verbal consent now. Are you willing to proceed with your visit today? Kashaun Bebo has provided verbal consent on 10/24/2021 for a virtual visit (video or telephone). [name], NP  Date: 10/24/2021 2:12 PM  Virtual Visit via Video Note   I, [name], connected with  [name]  ([id number], 1980-12-19) on 10/24/21 at  2:00 PM EDT by a video-enabled telemedicine application and verified that I am speaking with the correct person using two identifiers.  Location: Patient: Virtual Visit Location Patient:  Home Provider: Virtual Visit Location Provider: Home Office   I discussed the limitations of evaluation and management by telemedicine and the availability of in person appointments. The patient expressed understanding and agreed to proceed.    History of Present Illness: Brendan Gregory is a 41 y.o. who identifies as a male who was assigned male at birth, and is being seen today for anxiety.  Patient states EMS just left his home after having an apparent panic attack. Symptoms include: anxiousness, dizziness, headache, shakiness, difficulty catching his breath,  heat and feelings of flushing in the face along with chest and bilateral arm discomfort. EMS work up was negative for any cardiac etiology. He was prescribed hydroxyzine last year after a virtual visit however this prescription has expired. He was not taking any anxiolytics or SSRIs prior to that visit and is currently not taking any at this time. He does not endorse any thoughts of self harm at this time.    Problems:  Patient Active Problem List   Diagnosis Date Noted   Acute pain of left knee 09/02/2021   Solitary pulmonary nodule 05/04/2021   Agatston coronary artery calcium score less than 100 01/25/2021   Nonspecific chest pain 11/24/2020   Morbid obesity ([place]) 11/24/2020   Obstructive sleep apnea on CPAP 05/16/2019   Essential hypertension 05/16/2019    Allergies: No Known Allergies Medications:  Current Outpatient Medications:    atorvastatin (LIPITOR) 10 MG tablet, Take 1 tablet (10 mg total) by mouth daily., Disp: 90 tablet, Rfl: 3  Cholecalciferol (VITAMIN D3 PO), Take 5,000 Units by mouth daily., Disp: , Rfl:    folic acid (FOLVITE) 967 MCG tablet, Take 400 mcg by mouth daily., Disp: , Rfl:    magnesium gluconate (MAGONATE) 500 MG tablet, Take 500 mg by mouth daily., Disp: , Rfl:    metoprolol tartrate (LOPRESSOR) 100 MG tablet, Take 1 tablet (100 mg total) by mouth once for 1 dose. Take 1 tablet (100 mg total) two  hours prior to CT scan., Disp: 1 tablet, Rfl: 0   Omega-3 Fatty Acids (FISH OIL) 1000 MG CAPS, Take by mouth daily., Disp: , Rfl:    OVER THE COUNTER MEDICATION, Take 1 capsule by mouth daily at 6 (six) AM. Aged beet root, Disp: , Rfl:    valsartan-hydrochlorothiazide (DIOVAN-HCT) 320-25 MG tablet, Take 1 tablet by mouth daily., Disp: 90 tablet, Rfl: 3  Observations/Objective: Patient is well-developed, well-nourished in no acute distress.  Resting comfortably  at home.  Head is normocephalic, atraumatic.  No labored breathing.  Speech is clear and coherent with logical content.  Patient is alert and oriented at baseline.    Assessment and Plan: 1. Anxiety Follow up with PCP for GAD7/PHQ9/SSRI and anxiolytic prescribing if needed.  May take cyclobenzaprine for chest discomfort  Follow Up Instructions: I discussed the assessment and treatment plan with the patient. The patient was provided an opportunity to ask questions and all were answered. The patient agreed with the plan and demonstrated an understanding of the instructions.  A copy of instructions were sent to the patient via MyChart unless otherwise noted below.    The patient was advised to call back or seek an in-person evaluation if the symptoms worsen or if the condition fails to improve as anticipated.  Time:  I spent 12 minutes with the patient via telehealth technology discussing the above problems/concerns.    [name], NP

## 2021-10-25 ENCOUNTER — Ambulatory Visit: Payer: BC Managed Care – PPO | Admitting: Emergency Medicine

## 2021-10-25 ENCOUNTER — Encounter: Payer: Self-pay | Admitting: Emergency Medicine

## 2021-10-25 VITALS — BP 130/78 | HR 85 | Temp 98.7°F | Ht 71.0 in | Wt 296.0 lb

## 2021-10-25 DIAGNOSIS — F411 Generalized anxiety disorder: Secondary | ICD-10-CM

## 2021-10-25 DIAGNOSIS — I10 Essential (primary) hypertension: Secondary | ICD-10-CM | POA: Diagnosis not present

## 2021-10-25 DIAGNOSIS — F410 Panic disorder [episodic paroxysmal anxiety] without agoraphobia: Secondary | ICD-10-CM | POA: Diagnosis not present

## 2021-10-25 MED ORDER — BUSPIRONE HCL 7.5 MG PO TABS: 7.5000 mg | ORAL_TABLET | Freq: Two times a day (BID) | ORAL | 1 refills | Status: DC | PRN

## 2021-10-25 NOTE — Progress Notes (Signed)
Brendan Gregory 41 y.o.   Chief Complaint  Patient presents with   Hypertension   Anxiety    HISTORY OF PRESENT ILLNESS: This is a 41 y.o. male complaining of possible panic attacks for the past several weeks. Had EMS staff called to his house yesterday due to "not feeling well" type of symptoms. Blood pressure was slightly elevated, normal glucose.  Told he was probably having a panic attack.  Was having palpitations and anxiety symptoms. BP Readings from Last 3 Encounters:  10/25/21 140/84  09/07/21 (!) 142/86  09/02/21 120/78     HPI   Prior to Admission medications   Medication Sig Start Date End Date Taking? Authorizing Provider  atorvastatin (LIPITOR) 10 MG tablet Take 1 tablet (10 mg total) by mouth daily. 02/11/21  Yes Turner, Eber Hong, MD  Cholecalciferol (VITAMIN D3 PO) Take 5,000 Units by mouth daily.   Yes [provider]  folic acid (FOLVITE) 518 MCG tablet Take 400 mcg by mouth daily.   Yes [provider]  magnesium gluconate (MAGONATE) 500 MG tablet Take 500 mg by mouth daily.   Yes [provider]  Omega-3 Fatty Acids (FISH OIL) 1000 MG CAPS Take by mouth daily.   Yes [provider]  OVER THE COUNTER MEDICATION Take 1 capsule by mouth daily at 6 (six) AM. Aged beet root   Yes [provider]  valsartan-hydrochlorothiazide (DIOVAN-HCT) 320-25 MG tablet Take 1 tablet by mouth daily. 09/02/21 08/28/22 Yes Twylla Arceneaux, Ines Bloomer, MD  metoprolol tartrate (LOPRESSOR) 100 MG tablet Take 1 tablet (100 mg total) by mouth once for 1 dose. Take 1 tablet (100 mg total) two hours prior to CT scan. 01/29/21 01/29/21  Sueanne Margarita, MD    No Known Allergies  Patient Active Problem List   Diagnosis Date Noted   Acute pain of left knee 09/02/2021   Solitary pulmonary nodule 05/04/2021   Agatston coronary artery calcium score less than 100 01/25/2021   Nonspecific chest pain 11/24/2020   Morbid obesity ([place]) 11/24/2020   Obstructive  sleep apnea on CPAP 05/16/2019   Essential hypertension 05/16/2019    Past Medical History:  Diagnosis Date   Agatston coronary artery calcium score less than 100    Ca score 1.  No CAD on coronary CTA   Hypertension    Panic attacks     No past surgical history on file.  Social History   Socioeconomic History   Marital status: Married    Spouse name: Not on file   Number of children: Not on file   Years of education: Not on file   Highest education level: Not on file  Occupational History   Not on file  Tobacco Use   Smoking status: Never   Smokeless tobacco: Never  Substance and Sexual Activity   Alcohol use: Not on file    Comment: occasionally-beer   Drug use: Yes    Frequency: 3.0 times per week    Types: Marijuana    Comment: per week   Sexual activity: Not on file  Other Topics Concern   Not on file  Social History Narrative   Not on file   Social Determinants of Health   Financial Resource Strain: Not on file  Food Insecurity: Not on file  Transportation Needs: Not on file  Physical Activity: Not on file  Stress: Not on file  Social Connections: Not on file  Intimate Partner Violence: Not on file    Family History  Problem Relation  Age of Onset   Healthy Mother    Prostate cancer Father    Diabetes Father    Hypertension Father    Diabetes Brother    Hypertension Brother    Diabetes Maternal Grandfather      Review of Systems  Constitutional: Negative.  Negative for chills and fever.  HENT: Negative.  Negative for congestion and sore throat.   Respiratory: Negative.  Negative for cough and shortness of breath.   Cardiovascular:  Positive for palpitations. Negative for chest pain.  Gastrointestinal:  Negative for abdominal pain, nausea and vomiting.  Genitourinary: Negative.  Negative for dysuria and hematuria.  Musculoskeletal: Negative.   Skin: Negative.  Negative for rash.  Neurological:  Negative for dizziness and headaches.  All  other systems reviewed and are negative.  Today's Vitals   10/25/21 1341  BP: 140/84  Pulse: 85  Temp: 98.7 F (37.1 C)  TempSrc: Oral  SpO2: 97%  Weight: 296 lb (134.3 kg)  Height: '5\' 11"'$  (1.803 m)   Body mass index is 41.28 kg/m.   Physical Exam Vitals reviewed.  Constitutional:      Appearance: Normal appearance. He is obese.  HENT:     Head: Normocephalic.  Eyes:     Extraocular Movements: Extraocular movements intact.     Conjunctiva/sclera: Conjunctivae normal.     Pupils: Pupils are equal, round, and reactive to light.  Cardiovascular:     Rate and Rhythm: Normal rate and regular rhythm.     Pulses: Normal pulses.     Heart sounds: Normal heart sounds.  Pulmonary:     Effort: Pulmonary effort is normal.     Breath sounds: Normal breath sounds.  Musculoskeletal:     Cervical back: No tenderness.     Right lower leg: No edema.     Left lower leg: No edema.  Lymphadenopathy:     Cervical: No cervical adenopathy.  Skin:    General: Skin is warm and dry.     Capillary Refill: Capillary refill takes less than 2 seconds.  Neurological:     General: No focal deficit present.     Mental Status: He is alert and oriented to person, place, and time.  Psychiatric:        Mood and Affect: Mood normal.        Behavior: Behavior normal.      ASSESSMENT & PLAN: A total of 49 minutes was spent with the patient and counseling/coordination of care regarding preparing for this visit, review of most recent office visit notes, review of chronic medical problems and their management, review of all medications, review of most recent blood work results, diagnosis of panic attacks and treatment, stress management, prognosis, documentation, and need for follow-up.  Problem List Items Addressed This Visit       Cardiovascular and Mediastinum   Essential hypertension    Well-controlled hypertension. Continue Diovan-HCTZ 320-25 mg daily.        Other   Morbid obesity ([place])     Diet and nutrition discussed. Advised to decrease amount of daily carbohydrate intake and daily calories.      Panic attack - Primary    With similar episodes in the past and negative cardiac work-up. Clinically stable.  No red flag signs or symptoms. May benefit from BuSpar 7.5 mg twice a day as needed. Stress management discussed.      Relevant Medications   busPIRone (BUSPAR) 7.5 MG tablet   Generalized anxiety disorder    We will  try as needed medication. May need psychiatry referral in the near future.      Relevant Medications   busPIRone (BUSPAR) 7.5 MG tablet   Patient Instructions  Panic Attack A panic attack is when you suddenly feel very afraid, uncomfortable, or nervous (anxious). A panic attack can happen when you are scared, or it may happen for no reason. A panic attack can feel like a heart attack or stroke. See your doctor when you have a panic attack to make sure you are not having a heart attack or stroke. What are the causes? Experiencing things that threaten your life, such as a war. Feeling worried or nervous for a long time (anxiety disorder). Being sad (depressed). Panic disorder. Certain medical conditions. Other causes may include: Certain medicines. Taking certain supplements. Illegal drugs. What increases the risk? Having another mental health condition. Using alcohol or drugs. Being under a lot of stress. Having events in your life that cause worry and sadness. What are the signs or symptoms? A panic attack: Starts suddenly. May last 5-10 minutes. Symptoms include one or more of these: A pounding heart. A feeling that your heart is beating in an unusual way or faster than normal (palpitations). Sweating or shaking. Feeling short of breath. Chest pain. Feeling like you may vomit (nauseous). Feeling dizzy or like you might faint. Other symptoms may include: Chills or hot flashes. Numbness or tingling in your lips, hands, or  feet. Feeling confused. Fear of losing control. Fear of dying. How is this treated? A panic attack is a symptom of another condition. Treatment depends on the cause of the panic attack. If the cause is a medical problem, your doctor will treat that problem or refer you to a specialist. If the cause is emotional, you may be given medicines or referred to a counselor. If the cause is a medicine, your doctor may tell you to stop the medicine, change your dose, or take a different medicine. If the cause is an illegal drug, treatment may involve letting the drug wear off and taking medicine to help the drug leave your body or to stop its effects. Attacks caused by heavy drug use may continue even if you stop using the drug. Most panic attacks go away after the cause is treated. Follow these instructions at home: Alcohol use Do not drink alcohol if: Your doctor tells you not to drink. You are pregnant, may be pregnant, or are planning to become pregnant. If you drink alcohol: Limit how much you have to: 0-1 drink a day for women. 0-2 drinks a day for men. Know how much alcohol is in your drink. In the [place] one drink equals one 12 oz bottle of beer (355 mL), one 5 oz glass of wine (148 mL), or one 1 oz glass of hard liquor (44 mL). General instructions  Take over-the-counter and prescription medicines only as told by your doctor. If you feel worried or nervous, try not to have caffeine. Take good care of your health. To do this: Eat healthy. Make sure to eat fresh fruits and vegetables, whole grains, lean meats, and low-fat dairy. Get enough sleep. Try to sleep for 7-8 hours each night. Exercise. Try to be active for 30 minutes 5 or more days a week. Do not smoke or use any products that contain nicotine or tobacco. If you need help quitting, ask your doctor. Keep all follow-up visits. Where to find more information Substance Abuse and [facility]):  SamedayNews.com.cy National  Institute of [place] [facility]): https://carter.com/ Contact a doctor if: Your symptoms do not get better. Your symptoms get worse. You are not able to take your medicines as told. Get help right away if: You have thoughts of hurting yourself or others. Get help right away if you feel like you may hurt yourself or others, or have thoughts about taking your own life. Go to your nearest emergency room or: Call 911. Call the [place] at 939-305-2770 or 988. This is open 24 hours a day. Text the Crisis Text Line at (609) 840-3979. Summary A panic attack is when you suddenly feel very afraid, uncomfortable, or nervous (anxious). See your doctor when you have a panic attack to make sure that you do not have another serious problem. If you feel like you may hurt yourself or others, get help right away. Call 911. This information is not intended to replace advice given to you by your health care provider. Make sure you discuss any questions you have with your health care provider. Document Revised: 10/29/2020 Document Reviewed: 10/29/2020 Elsevier Patient Education  [place], MD [facility]

## 2021-10-25 NOTE — Assessment & Plan Note (Signed)
We will try as needed medication. May need psychiatry referral in the near future.

## 2021-10-25 NOTE — Assessment & Plan Note (Signed)
Well-controlled hypertension. Continue Diovan-HCTZ 320-25 mg daily.

## 2021-10-25 NOTE — Assessment & Plan Note (Addendum)
With similar episodes in the past and negative cardiac work-up. Clinically stable.  No red flag signs or symptoms. May benefit from BuSpar 7.5 mg twice a day as needed. Stress management discussed.

## 2021-10-25 NOTE — Patient Instructions (Signed)
Panic Attack A panic attack is when you suddenly feel very afraid, uncomfortable, or nervous (anxious). A panic attack can happen when you are scared, or it may happen for no reason. A panic attack can feel like a heart attack or stroke. See your doctor when you have a panic attack to make sure you are not having a heart attack or stroke. What are the causes? Experiencing things that threaten your life, such as a war. Feeling worried or nervous for a long time (anxiety disorder). Being sad (depressed). Panic disorder. Certain medical conditions. Other causes may include: Certain medicines. Taking certain supplements. Illegal drugs. What increases the risk? Having another mental health condition. Using alcohol or drugs. Being under a lot of stress. Having events in your life that cause worry and sadness. What are the signs or symptoms? A panic attack: Starts suddenly. May last 5-10 minutes. Symptoms include one or more of these: A pounding heart. A feeling that your heart is beating in an unusual way or faster than normal (palpitations). Sweating or shaking. Feeling short of breath. Chest pain. Feeling like you may vomit (nauseous). Feeling dizzy or like you might faint. Other symptoms may include: Chills or hot flashes. Numbness or tingling in your lips, hands, or feet. Feeling confused. Fear of losing control. Fear of dying. How is this treated? A panic attack is a symptom of another condition. Treatment depends on the cause of the panic attack. If the cause is a medical problem, your doctor will treat that problem or refer you to a specialist. If the cause is emotional, you may be given medicines or referred to a counselor. If the cause is a medicine, your doctor may tell you to stop the medicine, change your dose, or take a different medicine. If the cause is an illegal drug, treatment may involve letting the drug wear off and taking medicine to help the drug leave your  body or to stop its effects. Attacks caused by heavy drug use may continue even if you stop using the drug. Most panic attacks go away after the cause is treated. Follow these instructions at home: Alcohol use Do not drink alcohol if: Your doctor tells you not to drink. You are pregnant, may be pregnant, or are planning to become pregnant. If you drink alcohol: Limit how much you have to: 0-1 drink a day for women. 0-2 drinks a day for men. Know how much alcohol is in your drink. In the [place]., one drink equals one 12 oz bottle of beer (355 mL), one 5 oz glass of wine (148 mL), or one 1 oz glass of hard liquor (44 mL). General instructions  Take over-the-counter and prescription medicines only as told by your doctor. If you feel worried or nervous, try not to have caffeine. Take good care of your health. To do this: Eat healthy. Make sure to eat fresh fruits and vegetables, whole grains, lean meats, and low-fat dairy. Get enough sleep. Try to sleep for 7-8 hours each night. Exercise. Try to be active for 30 minutes 5 or more days a week. Do not smoke or use any products that contain nicotine or tobacco. If you need help quitting, ask your doctor. Keep all follow-up visits. Where to find more information Substance Abuse and Mental Health Services Administration (SAMHSA): samhsa.gov National Institute of Mental Health (NIMH): www.nimh.nih.gov Contact a doctor if: Your symptoms do not get better. Your symptoms get worse. You are not able to take your medicines as told. Get help   right away if: You have thoughts of hurting yourself or others. Get help right away if you feel like you may hurt yourself or others, or have thoughts about taking your own life. Go to your nearest emergency room or: Call 911. Call the National Suicide Prevention Lifeline at 1-800-273-8255 or 988. This is open 24 hours a day. Text the Crisis Text Line at 741741. Summary A panic attack is when you suddenly feel  very afraid, uncomfortable, or nervous (anxious). See your doctor when you have a panic attack to make sure that you do not have another serious problem. If you feel like you may hurt yourself or others, get help right away. Call 911. This information is not intended to replace advice given to you by your health care provider. Make sure you discuss any questions you have with your health care provider. Document Revised: 10/29/2020 Document Reviewed: 10/29/2020 Elsevier Patient Education  2023 Elsevier Inc.  

## 2021-10-25 NOTE — Assessment & Plan Note (Signed)
Diet and nutrition discussed. Advised to decrease amount of daily carbohydrate intake and daily calories.

## 2021-10-26 ENCOUNTER — Encounter: Payer: Self-pay | Admitting: Emergency Medicine

## 2021-10-26 ENCOUNTER — Telehealth: Payer: Self-pay | Admitting: Emergency Medicine

## 2021-10-26 DIAGNOSIS — F419 Anxiety disorder, unspecified: Secondary | ICD-10-CM | POA: Diagnosis not present

## 2021-10-26 DIAGNOSIS — I10 Essential (primary) hypertension: Secondary | ICD-10-CM | POA: Diagnosis not present

## 2021-10-26 NOTE — Telephone Encounter (Signed)
I do not want him taking anything he does not feel comfortable with.  Keep [place] posted.  Thanks.

## 2021-10-26 NOTE — Telephone Encounter (Signed)
Patient called and states he had another panic attack today. Patient states his blood pressure when EMS was there was 229/147  blood pressure went down after a few minutes to 160/100. Patient declined a trip to the ER. Patient states he only took 1 tablets of the Buspar. He does not feel comfortable taking the other tablet. Patient states he will let us know what his blood pressure reading is after 30 mins Please advise.

## 2021-10-26 NOTE — Telephone Encounter (Signed)
Pt  called and stated his bp is 102/60. He is feeling very fatigue and light headed. Advised pt to go to urgent care.   fyi

## 2021-10-27 ENCOUNTER — Encounter: Payer: Self-pay | Admitting: Emergency Medicine

## 2021-10-27 ENCOUNTER — Emergency Department
Admission: EM | Admit: 2021-10-27 | Discharge: 2021-10-27 | Disposition: A | Discharge status: Home / Self Care | Payer: BC Managed Care – PPO | Attending: Family Medicine | Admitting: Family Medicine

## 2021-10-27 DIAGNOSIS — I10 Essential (primary) hypertension: Secondary | ICD-10-CM | POA: Diagnosis not present

## 2021-10-27 DIAGNOSIS — F411 Generalized anxiety disorder: Secondary | ICD-10-CM

## 2021-10-27 DIAGNOSIS — R42 Dizziness and giddiness: Secondary | ICD-10-CM

## 2021-10-27 MED ORDER — ACETAMINOPHEN 325 MG PO TABS
650.0000 mg | ORAL_TABLET | Freq: Once | ORAL | Status: AC
  Administered 2021-10-27: 650 mg via ORAL

## 2021-10-27 MED ORDER — FLUOXETINE HCL 20 MG PO TABS: 20.0000 mg | ORAL_TABLET | Freq: Every day | ORAL | 0 refills | Status: DC

## 2021-10-27 NOTE — Discharge Instructions (Addendum)
Take fluoxetine daily Continue with your good eating habits and daily walk See your doctor next week

## 2021-10-27 NOTE — ED Triage Notes (Signed)
Pt has been having panic attacks & BP issues this week States he has had panic attacks this week & EMS has been out to house x 2  Complaints of a HA  Quit smoking marijuana 3 weeks ago  Panic attacks since having COVID  Recent life style changes to decrease weight

## 2021-10-27 NOTE — ED Provider Notes (Signed)
[facility]    CSN: [id number] Arrival date & time: 10/27/21  1536      History   Chief Complaint Chief Complaint  Patient presents with   Hypertension    HPI Brendan Gregory is a 41 y.o. male.   HPI  Patient states he has a past history of depression when he was a teenager.  He took Prozac successfully.  He was taken off of this "20 years ago".  He states he also has OCD.  He thinks he has a mild form that he is able to control.  It has not required medication.  He did have some anxiety in the past.  He was given hydroxyzine which did not make him feel improved.  Over the last 3 weeks he has had an increase in panic attacks.  He cannot identify any stressor in his life.  The panic attacks are making his blood pressure go up.  He is also had some headaches and dizzy feelings.  He thinks he may have vertigo.  EMS has come to his house for the panic attack.  He saw his primary care doctor.  He was prescribed buspirone.  This made him feel worse.  He went to another urgent care center.  He has had an EKG.  He has had unremarkable cardiac work-up for chest pain and does not have any known heart disease.  He worries that his blood pressure medicine is not working.  He has an appointment with his PCP on Monday, but does not want to wait that long.   Past Medical History:  Diagnosis Date   Agatston coronary artery calcium score less than 100    Ca score 1.  No CAD on coronary CTA   Hypertension    Panic attacks     Patient Active Problem List   Diagnosis Date Noted   Panic attack 10/25/2021   Generalized anxiety disorder 10/25/2021   Acute pain of left knee 09/02/2021   Solitary pulmonary nodule 05/04/2021   Agatston coronary artery calcium score less than 100 01/25/2021   Nonspecific chest pain 11/24/2020   Morbid obesity ([place]) 11/24/2020   Obstructive sleep apnea on CPAP 05/16/2019   Essential hypertension 05/16/2019    History reviewed. No pertinent surgical  history.     Home Medications    Prior to Admission medications   Medication Sig Start Date End Date Taking? Authorizing Provider  FLUoxetine (PROZAC) 20 MG tablet Take 1 tablet (20 mg total) by mouth daily. 10/27/21  Yes Raylene Everts, MD  atorvastatin (LIPITOR) 10 MG tablet Take 1 tablet (10 mg total) by mouth daily. 02/11/21   Sueanne Margarita, MD  busPIRone (BUSPAR) 7.5 MG tablet Take 1 tablet (7.5 mg total) by mouth 2 (two) times daily as needed. 10/25/21   Horald Pollen, MD  Cholecalciferol (VITAMIN D3 PO) Take 5,000 Units by mouth daily.    [provider]  folic acid (FOLVITE) 470 MCG tablet Take 400 mcg by mouth daily.    [provider]  magnesium gluconate (MAGONATE) 500 MG tablet Take 500 mg by mouth daily.    [provider]  Omega-3 Fatty Acids (FISH OIL) 1000 MG CAPS Take by mouth daily.    [provider]  OVER THE COUNTER MEDICATION Take 1 capsule by mouth daily at 6 (six) AM. Aged beet root    [provider]  valsartan-hydrochlorothiazide (DIOVAN-HCT) 320-25 MG tablet Take 1 tablet by mouth daily. 09/02/21 08/28/22  Horald Pollen,  MD    Family History Family History  Problem Relation Age of Onset   Healthy Mother    Prostate cancer Father    Diabetes Father    Hypertension Father    Diabetes Brother    Hypertension Brother    Diabetes Maternal Grandfather     Social History Social History   Tobacco Use   Smoking status: Never   Smokeless tobacco: Never  Vaping Use   Vaping Use: Former   Substances: THC  Substance Use Topics   Alcohol use: Not Currently   Drug use: Not Currently    Frequency: 3.0 times per week    Types: Marijuana    Comment: quit 3 weeks ago     Allergies   Patient has no known allergies.   Review of Systems Review of Systems See HPI  Physical Exam Triage Vital Signs ED Triage Vitals  Enc Vitals Group     BP 10/27/21 1552 (!) 153/101     Pulse Rate 10/27/21  1552 91     Resp 10/27/21 1552 18     Temp 10/27/21 1552 99.1 F (37.3 C)     Temp Source 10/27/21 1552 Oral     SpO2 10/27/21 1552 96 %     Weight 10/27/21 1554 292 lb (132.5 kg)     Height 10/27/21 1554 '5\' 11"'$  (1.803 m)     Head Circumference --      Peak Flow --      Pain Score 10/27/21 1553 6     Pain Loc --      Pain Edu? --      Excl. in [place]? --    No data found.  Updated Vital Signs BP (!) 148/88 (BP Location: Left Arm)   Pulse 78   Temp 99.1 F (37.3 C) (Oral)   Resp 18   Ht '5\' 11"'$  (1.803 m)   Wt 132.5 kg   SpO2 96%   BMI 40.73 kg/m       Physical Exam Constitutional:      General: He is not in acute distress.    Appearance: He is well-developed. He is obese.  HENT:     Head: Normocephalic and atraumatic.     Right Ear: Tympanic membrane and ear canal normal.     Left Ear: Tympanic membrane and ear canal normal.     Nose: Nose normal. No rhinorrhea.     Mouth/Throat:     Mouth: Mucous membranes are moist.     Pharynx: No posterior oropharyngeal erythema.  Eyes:     Conjunctiva/sclera: Conjunctivae normal.     Pupils: Pupils are equal, round, and reactive to light.  Neck:     Vascular: No carotid bruit.  Cardiovascular:     Rate and Rhythm: Normal rate and regular rhythm.     Heart sounds: Normal heart sounds.  Pulmonary:     Effort: Pulmonary effort is normal. No respiratory distress.     Breath sounds: Normal breath sounds.  Abdominal:     Palpations: Abdomen is soft.  Musculoskeletal:        General: Normal range of motion.     Cervical back: Normal range of motion and neck supple.     Right lower leg: No edema.     Left lower leg: No edema.  Lymphadenopathy:     Cervical: No cervical adenopathy.  Skin:    General: Skin is warm and dry.  Neurological:     General: No focal deficit present.  Mental Status: He is alert.  Psychiatric:     Comments: At times hesitant with speech.  Taking times to find the correct word.  States he feels  "brain fog"      UC Treatments / Results  Labs (all labs ordered are listed, but only abnormal results are displayed) Labs Reviewed - No data to display  EKG   Radiology No results found.  Procedures Procedures (including critical care time)  Medications Ordered in UC Medications  acetaminophen (TYLENOL) tablet 650 mg (650 mg Oral Given 10/27/21 1600)    Initial Impression / Assessment and Plan / UC Course  I have reviewed the triage vital signs and the nursing notes.  Pertinent labs & imaging results that were available during my care of the patient were reviewed by me and considered in my medical decision making (see chart for details).     I spent some time discussing with the patient that blood pressure always goes up when somebody is upset and experiencing anxiety especially panic attacks.  His blood pressure is not high enough to be dangerous.  His blood pressure medicine is probably still working.  What he needs to work on is his anxiety.  He is unable to tolerate hydroxyzine or buspirone.  I advised him that the "Valium drugs like Xanax" are ill-advised and would only treat a panic attack and not his generalized anxiety state.  He agrees to go back on Prozac.  I advised him to talk to his doctor about it Monday, however, he feels urgency to start medicine sooner.  I gave him a 30-day supply with no refills. Final Clinical Impressions(s) / UC Diagnoses   Final diagnoses:  Primary hypertension  Dizziness  Anxiety state     Discharge Instructions      Take fluoxetine daily Continue with your good eating habits and daily walk See your doctor next week   ED Prescriptions     Medication Sig Dispense Auth. Provider   FLUoxetine (PROZAC) 20 MG tablet Take 1 tablet (20 mg total) by mouth daily. 30 tablet Raylene Everts, MD      PDMP not reviewed this encounter.   Raylene Everts, MD 10/27/21 [phone]

## 2021-10-27 NOTE — Telephone Encounter (Signed)
Sent message via [place] of provider recommendations

## 2021-10-28 ENCOUNTER — Encounter: Payer: Self-pay | Admitting: Physical Therapy

## 2021-10-28 ENCOUNTER — Ambulatory Visit: Payer: BC Managed Care – PPO | Admitting: Physical Therapy

## 2021-10-28 ENCOUNTER — Telehealth: Payer: Self-pay | Admitting: Emergency Medicine

## 2021-10-28 DIAGNOSIS — R262 Difficulty in walking, not elsewhere classified: Secondary | ICD-10-CM | POA: Diagnosis not present

## 2021-10-28 DIAGNOSIS — R2689 Other abnormalities of gait and mobility: Secondary | ICD-10-CM

## 2021-10-28 DIAGNOSIS — R2681 Unsteadiness on feet: Secondary | ICD-10-CM | POA: Diagnosis not present

## 2021-10-28 DIAGNOSIS — R293 Abnormal posture: Secondary | ICD-10-CM

## 2021-10-28 DIAGNOSIS — R278 Other lack of coordination: Secondary | ICD-10-CM

## 2021-10-28 DIAGNOSIS — M6281 Muscle weakness (generalized): Secondary | ICD-10-CM | POA: Diagnosis not present

## 2021-10-28 DIAGNOSIS — M25562 Pain in left knee: Secondary | ICD-10-CM | POA: Diagnosis not present

## 2021-10-28 DIAGNOSIS — R600 Localized edema: Secondary | ICD-10-CM | POA: Diagnosis not present

## 2021-10-28 NOTE — Therapy (Signed)
OUTPATIENT PHYSICAL THERAPY LOWER EXTREMITY   Patient Name: Brendan Gregory MRN: [id number] DOB:01-25-1981, 41 y.o., male Today's Date: 10/28/2021   PT End of Session - 10/28/21 0851     Visit Number 5    Date for PT Re-Evaluation 10/26/21    PT Start Time 0846    PT Stop Time 0924    PT Time Calculation (min) 38 min    Activity Tolerance Patient tolerated treatment well    Behavior During Therapy [facility] for tasks assessed/performed                Past Medical History:  Diagnosis Date   Agatston coronary artery calcium score less than 100    Ca score 1.  No CAD on coronary CTA   Hypertension    Panic attacks    History reviewed. No pertinent surgical history. Patient Active Problem List   Diagnosis Date Noted   Panic attack 10/25/2021   Generalized anxiety disorder 10/25/2021   Acute pain of left knee 09/02/2021   Solitary pulmonary nodule 05/04/2021   Agatston coronary artery calcium score less than 100 01/25/2021   Nonspecific chest pain 11/24/2020   Morbid obesity ([place]) 11/24/2020   Obstructive sleep apnea on CPAP 05/16/2019   Essential hypertension 05/16/2019    PCP: Agustina Caroli  REFERRING PROVIDER: Agustina Caroli  REFERRING DIAG: [phone]  THERAPY DIAG:  Abnormal posture  Difficulty in walking, not elsewhere classified  Localized edema  Other lack of coordination  Other abnormalities of gait and mobility  Muscle weakness (generalized)  Unsteadiness on feet  Rationale for Evaluation and Treatment Rehabilitation  ONSET DATE: 09/02/21  SUBJECTIVE:   SUBJECTIVE STATEMENT: Patient reports that he has been having panic attacks this past week. His knee seems to be getting a bit better, he walked a mile this morning.  PERTINENT HISTORY: HTN, obesity, anxiety   PAIN:  Are you having pain? No  PRECAUTIONS: None  WEIGHT BEARING RESTRICTIONS No  FALLS:  Has patient fallen in last 6 months? No  LIVING ENVIRONMENT: Lives with: lives with  their family Lives in: House/apartment Stairs: Yes: Internal: 3 flights steps; can reach both Has following equipment at home: None  OCCUPATION: remote IT work  PLOF: Independent  PATIENT GOALS not have the knee pain anymore,    OBJECTIVE:  full left knee ROM, full squat painfree and 5/5 left knee MMT    TODAY'S TREATMENT:  10/28/21 Recumbent bike L3.5 x 6 minutes. Supine SLR 5#, 2 x 10 reps SLR 3#, 2 x 10 reps Bridge with abd against red Tband 2 x 10 reps Sit <> stand 2 x 10 [place] to L knee 1.2 w/cm2 x 7 minutes   10/19/21 Recumbent bike L4 x 6 minutes. Patellar assessment-L patellar compression test pos, also C/O pain in patellar tendon and sup/lat to patella with patellar depression. Supine SLR, 5# 1 x 10 reps, ER with 3#, 2 x 10 reps. Cybex knee flexion B 25#, 2 x 10, knee ext L 10# 2 x 10 reps. TKE with ball 2 x 10 reps Sit <> stand with Gr theraband resistance in hip abd, 10 reps, Side to side step with green Tband resistance 10 reps [place] @ 1.2 w/cm to L knee around sup, lat, inf kneecap, including patellar tendon. Declined ionto today  10/13/21 Nu-Step L5 x 6 minutes L TKE against ball in stand, 2 x 10 reps Knee flexion B 45#, 15 reps, had to stop due to pain. Knee ext with slow release, LLE 15#, 2  x 10 reps. [place] to patellar tendon and ant/lateral joint line 7 min, 1.2 w/cm2. Ionto patch  10/01/21 Fibular head mobs- good ROM Patellar mobs - good motion and tracking, very tender over patellar tendon [place] to left patellar tendon 1.2 w/cm 2 100 % cont ( some left knee jumping over lateral aspect) Ionto patch 1.1 cc dex 4 hour leave on patch left pat tendon- needed tape to hold in place to added KT tape to unload patellla    Eval-POC    PATIENT EDUCATION:  Education details: POC Person educated: Patient Education method: Customer service manager Education comprehension: verbalized understanding   HOME EXERCISE PROGRAM: TBD  ASSESSMENT:  CLINICAL  IMPRESSION:  Patient reports new onset of panic attacks for which he is being treated. He feels the knee is slowly improving. Treatment continued focus on strengthening and [place]. OBJECTIVE IMPAIRMENTS pain.   ACTIVITY LIMITATIONS bed mobility, caring for others, and bathing his daughter and getting up from floor sometimes  PERSONAL FACTORS 1-2 comorbidities: HTN and unable to reproduce pain  are also affecting patient's functional outcome.   REHAB POTENTIAL: Fair depending on if we can find the source of pain to treat  CLINICAL DECISION MAKING: Evolving/moderate complexity  EVALUATION COMPLEXITY: Moderate  GOALS: Goals reviewed with patient? No  SHORT TERM GOALS: Target date: 10/12/21  Patient will be independent with initial HEP. Goal status: ongoing   LONG TERM GOALS: Target date: 10/26/21  1.  Patient will report at least 75% improvement in L knee pain to improve QOL. Goal status: INITIAL   2.  Patient will demonstrate improved functional LE movement to be able to kneel onto L knee without pain.  Baseline: 7/10 Goal status: INITIAL   3.  Patient will report 29 on FOTO (patient reported outcome measure) to demonstrate improved functional ability. Baseline: 77 Goal status: INITIAL    PLAN: PT FREQUENCY: 1-2x/week  PT DURATION: 4 weeks  PLANNED INTERVENTIONS: Therapeutic exercises, Therapeutic activity, Neuromuscular re-education, Balance training, Gait training, Patient/Family education, Joint mobilization, Dry Needling, Electrical stimulation, Cryotherapy, Moist heat, Taping, Vasopneumatic device, Ionotophoresis '4mg'$ /ml Dexamethasone, and Manual therapy  HEP  [id number],  [id number]  PLAN FOR NEXT SESSION: See how patient tolerated Treatment and HEP  Marcelina Morel, DPT 10/28/2021, 9:36 AM

## 2021-11-01 ENCOUNTER — Encounter: Payer: Self-pay | Admitting: Internal Medicine

## 2021-11-01 ENCOUNTER — Ambulatory Visit: Payer: BC Managed Care – PPO | Admitting: Internal Medicine

## 2021-11-01 ENCOUNTER — Telehealth: Payer: Self-pay | Admitting: Emergency Medicine

## 2021-11-01 VITALS — BP 136/80 | HR 77 | Resp 18 | Ht 71.0 in | Wt 287.2 lb

## 2021-11-01 DIAGNOSIS — F411 Generalized anxiety disorder: Secondary | ICD-10-CM

## 2021-11-01 DIAGNOSIS — R079 Chest pain, unspecified: Secondary | ICD-10-CM

## 2021-11-01 DIAGNOSIS — F419 Anxiety disorder, unspecified: Secondary | ICD-10-CM | POA: Diagnosis not present

## 2021-11-01 DIAGNOSIS — I10 Essential (primary) hypertension: Secondary | ICD-10-CM

## 2021-11-01 DIAGNOSIS — R6884 Jaw pain: Secondary | ICD-10-CM | POA: Diagnosis not present

## 2021-11-01 DIAGNOSIS — R739 Hyperglycemia, unspecified: Secondary | ICD-10-CM | POA: Diagnosis not present

## 2021-11-01 LAB — TSH: TSH: 0.7 u[IU]/mL (ref 0.35–5.50)

## 2021-11-01 LAB — COMPREHENSIVE METABOLIC PANEL
ALT: 47 U/L (ref 0–53)
AST: 37 U/L (ref 0–37)
Albumin: 5.1 g/dL (ref 3.5–5.2)
Alkaline Phosphatase: 71 U/L (ref 39–117)
BUN: 17 mg/dL (ref 6–23)
CO2: 31 mEq/L (ref 19–32)
Calcium: 10.4 mg/dL (ref 8.4–10.5)
Chloride: 91 mEq/L — ABNORMAL LOW (ref 96–112)
Creatinine, Ser: 1.12 mg/dL (ref 0.40–1.50)
GFR: 82.61 mL/min (ref >60.00)
Glucose, Bld: 97 mg/dL (ref 70–99)
Potassium: 3.6 mEq/L (ref 3.5–5.1)
Sodium: 135 mEq/L (ref 135–145)
Total Bilirubin: 0.9 mg/dL (ref 0.2–1.2)
Total Protein: 8.6 g/dL — ABNORMAL HIGH (ref 6.0–8.3)

## 2021-11-01 LAB — HEMOGLOBIN A1C: Hgb A1c MFr Bld: 5.6 % (ref 4.6–6.5)

## 2021-11-01 LAB — CBC
HCT: 45.2 % (ref 39.0–52.0)
Hemoglobin: 15.6 g/dL (ref 13.0–17.0)
MCHC: 34.6 g/dL (ref 30.0–36.0)
MCV: 89.1 fl (ref 78.0–100.0)
Platelets: 305 10*3/uL (ref 150.0–400.0)
RBC: 5.07 Mil/uL (ref 4.22–5.81)
RDW: 12.7 % (ref 11.5–15.5)
WBC: 10 10*3/uL (ref 4.0–10.5)

## 2021-11-01 LAB — TROPONIN I (HIGH SENSITIVITY): High Sens Troponin I: 13 ng/L (ref 2–17)

## 2021-11-01 LAB — PSA: PSA: 0.29 ng/mL (ref 0.10–4.00)

## 2021-11-01 MED ORDER — FLUOXETINE HCL 20 MG PO TABS: 20.0000 mg | ORAL_TABLET | Freq: Every day | ORAL | 0 refills | Status: DC

## 2021-11-01 MED ORDER — CHLORTHALIDONE 25 MG PO TABS: 25.0000 mg | ORAL_TABLET | Freq: Every day | ORAL | 0 refills | Status: DC

## 2021-11-01 NOTE — Progress Notes (Signed)
   Subjective:   Patient ID: Brendan Gregory, male    DOB: 1980-09-28, 41 y.o.   MRN: [id number]  HPI The patient is a 41 YO man coming in for some concerns with overall health and anxiety and BP recently.   Review of Systems  Constitutional:  Positive for activity change and appetite change.  HENT: Negative.    Eyes: Negative.   Respiratory:  Positive for chest tightness. Negative for cough and shortness of breath.   Cardiovascular:  Positive for chest pain and palpitations. Negative for leg swelling.  Gastrointestinal:  Negative for abdominal distention, abdominal pain, constipation, diarrhea, nausea and vomiting.  Musculoskeletal: Negative.   Skin: Negative.   Neurological: Negative.   Psychiatric/Behavioral:  Positive for decreased concentration and sleep disturbance. The patient is nervous/anxious.     Objective:  Physical Exam Constitutional:      Appearance: He is well-developed. He is obese.  HENT:     Head: Normocephalic and atraumatic.  Cardiovascular:     Rate and Rhythm: Normal rate and regular rhythm.  Pulmonary:     Effort: Pulmonary effort is normal. No respiratory distress.     Breath sounds: Normal breath sounds. No wheezing or rales.  Abdominal:     General: Bowel sounds are normal. There is no distension.     Palpations: Abdomen is soft.     Tenderness: There is no abdominal tenderness. There is no rebound.  Musculoskeletal:     Cervical back: Normal range of motion.  Skin:    General: Skin is warm and dry.  Neurological:     Mental Status: He is alert and oriented to person, place, and time.     Coordination: Coordination normal.     Vitals:   11/01/21 0954  BP: 136/80  Pulse: 77  Resp: 18  SpO2: 96%  Weight: 287 lb 3.2 oz (130.3 kg)  Height: '5\' 11"'$  (1.803 m)    Assessment & Plan:  Visit time 30 minutes in face to face communication with patient and coordination of care, additional 10 minutes spent in record review, coordination or care,  ordering tests, communicating/referring to other healthcare professionals, documenting in medical records all on the same day of the visit for total time 40 minutes spent on the visit.

## 2021-11-01 NOTE — Patient Instructions (Addendum)
We will have you keep taking prozac daily and this usually takes 2-4 weeks to kick in.  We will send in chlorthalidone to replace the valsartan/hctz.

## 2021-11-01 NOTE — Telephone Encounter (Signed)
Patient is requesting transfer of care from Dr. Mitchel Honour to Dr. Sharlet Salina.

## 2021-11-02 NOTE — Assessment & Plan Note (Signed)
Suspect related to anxiety. Recent cardiology evaluation which we reviewed as well as EKG from recent visit. Checking troponin as none assessed recently with high BP and chest pains.

## 2021-11-02 NOTE — Telephone Encounter (Signed)
Ok for [facility] should continue care with PCP until [facility] visit

## 2021-11-02 NOTE — Assessment & Plan Note (Signed)
Working on weight loss with dietary changes and this is improving well.

## 2021-11-02 NOTE — Assessment & Plan Note (Signed)
Having vivid dreams which we discussed could be related to prozac 20 mg daily. He has taken this without problems in the past. Will continue prozac 20 mg daily for 4 weeks then he will contact/return for visit and dose adjustment as needed. He is not using anything prn for anxiety/panic episodes currently. We discussed hydroxyzine and he did not want rx for that today. Buspar did cause side effects so this was discontinued from medication list.

## 2021-11-02 NOTE — Assessment & Plan Note (Signed)
BP has been labile with mood changes and panic attacks. He feels since addition of hctz to valsartan bp has not been controlled. We will stop valsartan/hctz as he feels best when this is wearing off daily. Start chlorthalidone 25 mg daily and follow up 2-4 weeks for BP check and CMP. Checking CMP and CBC today as no labs to assess why BP is elevated and if any end organ damage has occurred.

## 2021-11-04 ENCOUNTER — Encounter: Payer: Self-pay | Admitting: Physical Therapy

## 2021-11-04 ENCOUNTER — Ambulatory Visit: Payer: BC Managed Care – PPO | Attending: Family Medicine | Admitting: Physical Therapy

## 2021-11-04 DIAGNOSIS — R2689 Other abnormalities of gait and mobility: Secondary | ICD-10-CM | POA: Insufficient documentation

## 2021-11-04 DIAGNOSIS — R2681 Unsteadiness on feet: Secondary | ICD-10-CM | POA: Diagnosis not present

## 2021-11-04 DIAGNOSIS — R600 Localized edema: Secondary | ICD-10-CM | POA: Diagnosis not present

## 2021-11-04 DIAGNOSIS — R278 Other lack of coordination: Secondary | ICD-10-CM | POA: Diagnosis not present

## 2021-11-04 DIAGNOSIS — R262 Difficulty in walking, not elsewhere classified: Secondary | ICD-10-CM | POA: Diagnosis not present

## 2021-11-04 DIAGNOSIS — R293 Abnormal posture: Secondary | ICD-10-CM | POA: Diagnosis not present

## 2021-11-04 DIAGNOSIS — M6281 Muscle weakness (generalized): Secondary | ICD-10-CM | POA: Insufficient documentation

## 2021-11-04 DIAGNOSIS — M25562 Pain in left knee: Secondary | ICD-10-CM | POA: Insufficient documentation

## 2021-11-04 NOTE — Therapy (Signed)
OUTPATIENT PHYSICAL THERAPY LOWER EXTREMITY   Patient Name: Brendan Gregory MRN: [id number] DOB:25-Oct-1980, 41 y.o., male Today's Date: 11/04/2021   PT End of Session - 11/04/21 0855     Visit Number 6    Date for PT Re-Evaluation 12/02/21    PT Start Time 0848    PT Stop Time 0926    PT Time Calculation (min) 38 min    Activity Tolerance Patient tolerated treatment well    Behavior During Therapy [facility] for tasks assessed/performed                 Past Medical History:  Diagnosis Date   Agatston coronary artery calcium score less than 100    Ca score 1.  No CAD on coronary CTA   Hypertension    Panic attacks    History reviewed. No pertinent surgical history. Patient Active Problem List   Diagnosis Date Noted   Panic attack 10/25/2021   Generalized anxiety disorder 10/25/2021   Acute pain of left knee 09/02/2021   Solitary pulmonary nodule 05/04/2021   Agatston coronary artery calcium score less than 100 01/25/2021   Nonspecific chest pain 11/24/2020   Morbid obesity ([place]) 11/24/2020   Obstructive sleep apnea on CPAP 05/16/2019   Essential hypertension 05/16/2019    PCP: Agustina Caroli  REFERRING PROVIDER: Agustina Caroli  REFERRING DIAG: [phone]  THERAPY DIAG:  Abnormal posture  Other lack of coordination  Unsteadiness on feet  Muscle weakness (generalized)  Difficulty in walking, not elsewhere classified  Acute pain of left knee  Rationale for Evaluation and Treatment Rehabilitation  ONSET DATE: 09/02/21  SUBJECTIVE:   SUBJECTIVE STATEMENT: Patient reports that he identified the source of the panic attacks. It was caused by a new medication, which he has since stopped. He has not had any additional attacks to date. He also reports that his knee appears to be feeling better. He walked 4 miles the other day without knee pain.  PERTINENT HISTORY: HTN, obesity, anxiety   PAIN:  Are you having pain? No  PRECAUTIONS: None  WEIGHT BEARING  RESTRICTIONS No  FALLS:  Has patient fallen in last 6 months? No  LIVING ENVIRONMENT: Lives with: lives with their family Lives in: House/apartment Stairs: Yes: Internal: 3 flights steps; can reach both Has following equipment at home: None  OCCUPATION: remote IT work  PLOF: Independent  PATIENT GOALS not have the knee pain anymore,    OBJECTIVE:  full left knee ROM, full squat painfree and 5/5 left knee MMT    TODAY'S TREATMENT:  11/04/21 NuStep L5 x 6 minutes Quadruped, shift back onto heels for quad stretch Leg out, alternating Bird dog- 10 reps U knee flex 25#, 2 x 10 reps U knee ext 10#, 2 x 10 reps [place] to L knee, 1.2 w/cm2 x 7 minutes    10/28/21 Recumbent bike L3.5 x 6 minutes. Supine SLR 5#, 2 x 10 reps SLR 3#, 2 x 10 reps Bridge with abd against red Tband 2 x 10 reps Sit <> stand 2 x 10 [place] to L knee 1.2 w/cm2 x 7 minutes   10/19/21 Recumbent bike L4 x 6 minutes. Patellar assessment-L patellar compression test pos, also C/O pain in patellar tendon and sup/lat to patella with patellar depression. Supine SLR, 5# 1 x 10 reps, ER with 3#, 2 x 10 reps. Cybex knee flexion B 25#, 2 x 10, knee ext L 10# 2 x 10 reps. TKE with ball 2 x 10 reps Sit <> stand with Gr  theraband resistance in hip abd, 10 reps, Side to side step with green Tband resistance 10 reps [place] @ 1.2 w/cm to L knee around sup, lat, inf kneecap, including patellar tendon. Declined ionto today  10/13/21 Nu-Step L5 x 6 minutes L TKE against ball in stand, 2 x 10 reps Knee flexion B 45#, 15 reps, had to stop due to pain. Knee ext with slow release, LLE 15#, 2 x 10 reps. [place] to patellar tendon and ant/lateral joint line 7 min, 1.2 w/cm2. Ionto patch  10/01/21 Fibular head mobs- good ROM Patellar mobs - good motion and tracking, very tender over patellar tendon [place] to left patellar tendon 1.2 w/cm 2 100 % cont ( some left knee jumping over lateral aspect) Ionto patch 1.1 cc dex 4 hour leave on patch  left pat tendon- needed tape to hold in place to added KT tape to unload patellla    Eval-POC    PATIENT EDUCATION:  Education details: POC Person educated: Patient Education method: Customer service manager Education comprehension: verbalized understanding   HOME EXERCISE PROGRAM: [id number], [id number]  ASSESSMENT:  CLINICAL IMPRESSION:  Patient reports panic attacks have gone since changing medication. He is also reporting improved knee pain. Treatment progressed with increased WB OBJECTIVE IMPAIRMENTS pain.   ACTIVITY LIMITATIONS bed mobility, caring for others, and bathing his daughter and getting up from floor sometimes  PERSONAL FACTORS 1-2 comorbidities: HTN and unable to reproduce pain  are also affecting patient's functional outcome.   REHAB POTENTIAL: Fair depending on if we can find the source of pain to treat  CLINICAL DECISION MAKING: Evolving/moderate complexity  EVALUATION COMPLEXITY: Moderate  GOALS: Goals reviewed with patient? No  SHORT TERM GOALS: Target date: 10/12/21  Patient will be independent with initial HEP. Goal status: ongoing   LONG TERM GOALS: Target date: 10/26/21  1.  Patient will report at least 75% improvement in L knee pain to improve QOL. Goal status: ongoing, improved    2.  Patient will demonstrate improved functional LE movement to be able to kneel onto L knee without pain.  Baseline: 7310 Goal status: ongoing   3.  Patient will report 51 on FOTO (patient reported outcome measure) to demonstrate improved functional ability. Baseline: 77 Goal status: ongoing    PLAN: PT FREQUENCY: 1-2x/week  PT DURATION: 4 weeks  PLANNED INTERVENTIONS: Therapeutic exercises, Therapeutic activity, Neuromuscular re-education, Balance training, Gait training, Patient/Family education, Joint mobilization, Dry Needling, Electrical stimulation, Cryotherapy, Moist heat, Taping, Vasopneumatic device, Ionotophoresis '4mg'$ /ml Dexamethasone, and  Manual therapy  HEP  [id number],  [id number]  PLAN FOR NEXT SESSION: See how patient tolerated Treatment and HEP  Marcelina Morel, DPT 11/04/2021, 9:40 AM

## 2021-11-11 ENCOUNTER — Encounter: Payer: Self-pay | Admitting: Physical Therapy

## 2021-11-11 ENCOUNTER — Ambulatory Visit: Payer: BC Managed Care – PPO | Admitting: Physical Therapy

## 2021-11-11 DIAGNOSIS — R2681 Unsteadiness on feet: Secondary | ICD-10-CM

## 2021-11-11 DIAGNOSIS — R278 Other lack of coordination: Secondary | ICD-10-CM

## 2021-11-11 DIAGNOSIS — M25562 Pain in left knee: Secondary | ICD-10-CM | POA: Diagnosis not present

## 2021-11-11 DIAGNOSIS — R262 Difficulty in walking, not elsewhere classified: Secondary | ICD-10-CM

## 2021-11-11 DIAGNOSIS — R293 Abnormal posture: Secondary | ICD-10-CM

## 2021-11-11 DIAGNOSIS — M6281 Muscle weakness (generalized): Secondary | ICD-10-CM | POA: Diagnosis not present

## 2021-11-11 DIAGNOSIS — R2689 Other abnormalities of gait and mobility: Secondary | ICD-10-CM | POA: Diagnosis not present

## 2021-11-11 DIAGNOSIS — R600 Localized edema: Secondary | ICD-10-CM | POA: Diagnosis not present

## 2021-11-11 NOTE — Therapy (Signed)
OUTPATIENT PHYSICAL THERAPY LOWER EXTREMITY   Patient Name: Sudeep Scheibel MRN: [id number] DOB:07/24/1980, 41 y.o., male Today's Date: 11/11/2021   PT End of Session - 11/11/21 0807     Visit Number 7    Date for PT Re-Evaluation 12/02/21    PT Start Time 0758    PT Stop Time 0840    PT Time Calculation (min) 42 min    Activity Tolerance Patient tolerated treatment well    Behavior During Therapy [facility] for tasks assessed/performed                  Past Medical History:  Diagnosis Date   Agatston coronary artery calcium score less than 100    Ca score 1.  No CAD on coronary CTA   Hypertension    Panic attacks    History reviewed. No pertinent surgical history. Patient Active Problem List   Diagnosis Date Noted   Panic attack 10/25/2021   Generalized anxiety disorder 10/25/2021   Acute pain of left knee 09/02/2021   Solitary pulmonary nodule 05/04/2021   Agatston coronary artery calcium score less than 100 01/25/2021   Nonspecific chest pain 11/24/2020   Morbid obesity ([place]) 11/24/2020   Obstructive sleep apnea on CPAP 05/16/2019   Essential hypertension 05/16/2019    PCP: Agustina Caroli  REFERRING PROVIDER: Agustina Caroli  REFERRING DIAG: [phone]  THERAPY DIAG:  Abnormal posture  Other lack of coordination  Unsteadiness on feet  Acute pain of left knee  Difficulty in walking, not elsewhere classified  Muscle weakness (generalized)  Other abnormalities of gait and mobility  Rationale for Evaluation and Treatment Rehabilitation  ONSET DATE: 09/02/21  SUBJECTIVE:   SUBJECTIVE STATEMENT: Patient reports that his knee pain continues to improve, tolerating more weight without pain.  PERTINENT HISTORY: HTN, obesity, anxiety   PAIN:  Are you having pain? No  PRECAUTIONS: None  WEIGHT BEARING RESTRICTIONS No  FALLS:  Has patient fallen in last 6 months? No  LIVING ENVIRONMENT: Lives with: lives with their family Lives in:  House/apartment Stairs: Yes: Internal: 3 flights steps; can reach both Has following equipment at home: None  OCCUPATION: remote IT work  PLOF: Independent  PATIENT GOALS not have the knee pain anymore,    OBJECTIVE:  full left knee ROM, full squat painfree and 5/5 left knee MMT    TODAY'S TREATMENT:  11/11/21 NuStep L5 x 6 minutes U knee flex, L 25#, 2 x 10 reps U knee ext, L 10#, 2 x 10 reps Bridging x 10, U bridge x 10 reps each SLR with hip ER, 2 x 10 reps Bird dogs 5 x 10 sec each side. [place] at 1.2 W/cm2 to L knee x 7 minutes  11/04/21 NuStep L5 x 6 minutes Quadruped, shift back onto heels for quad stretch Leg out, alternating Bird dog- 10 reps U knee flex 25#, 2 x 10 reps U knee ext 10#, 2 x 10 reps [place] to L knee, 1.2 w/cm2 x 7 minutes    10/28/21 Recumbent bike L3.5 x 6 minutes. Supine SLR 5#, 2 x 10 reps SLR 3#, 2 x 10 reps Bridge with abd against red Tband 2 x 10 reps Sit <> stand 2 x 10 [place] to L knee 1.2 w/cm2 x 7 minutes   10/19/21 Recumbent bike L4 x 6 minutes. Patellar assessment-L patellar compression test pos, also C/O pain in patellar tendon and sup/lat to patella with patellar depression. Supine SLR, 5# 1 x 10 reps, ER with 3#, 2 x 10  reps. Cybex knee flexion B 25#, 2 x 10, knee ext L 10# 2 x 10 reps. TKE with ball 2 x 10 reps Sit <> stand with Gr theraband resistance in hip abd, 10 reps, Side to side step with green Tband resistance 10 reps [place] @ 1.2 w/cm to L knee around sup, lat, inf kneecap, including patellar tendon. Declined ionto today  10/13/21 Nu-Step L5 x 6 minutes L TKE against ball in stand, 2 x 10 reps Knee flexion B 45#, 15 reps, had to stop due to pain. Knee ext with slow release, LLE 15#, 2 x 10 reps. [place] to patellar tendon and ant/lateral joint line 7 min, 1.2 w/cm2. Ionto patch  10/01/21 Fibular head mobs- good ROM Patellar mobs - good motion and tracking, very tender over patellar tendon [place] to left patellar tendon 1.2 w/cm 2 100  % cont ( some left knee jumping over lateral aspect) Ionto patch 1.1 cc dex 4 hour leave on patch left pat tendon- needed tape to hold in place to added KT tape to unload patellla    Eval-POC    PATIENT EDUCATION:  Education details: POC Person educated: Patient Education method: Customer service manager Education comprehension: verbalized understanding   HOME EXERCISE PROGRAM: [id number], [id number]  ASSESSMENT:  CLINICAL IMPRESSION:  Patient reports continued improvement. Progressed with HEP. Plan to re-assess on next visit.  OBJECTIVE IMPAIRMENTS pain.   ACTIVITY LIMITATIONS bed mobility, caring for others, and bathing his daughter and getting up from floor sometimes  PERSONAL FACTORS 1-2 comorbidities: HTN and unable to reproduce pain  are also affecting patient's functional outcome.   REHAB POTENTIAL: Fair depending on if we can find the source of pain to treat  CLINICAL DECISION MAKING: Evolving/moderate complexity  EVALUATION COMPLEXITY: Moderate  GOALS: Goals reviewed with patient? No  SHORT TERM GOALS: Target date: 10/12/21  Patient will be independent with initial HEP. Goal status: met   LONG TERM GOALS: Target date: 10/26/21  1.  Patient will report at least 75% improvement in L knee pain to improve QOL. Goal status: ongoing, improved    2.  Patient will demonstrate improved functional LE movement to be able to kneel onto L knee without pain.  Baseline: 7310 Goal status: ongoing   3.  Patient will report 78 on FOTO (patient reported outcome measure) to demonstrate improved functional ability. Baseline: 77 Goal status: ongoing    PLAN: PT FREQUENCY: 1-2x/week  PT DURATION: 4 weeks  PLANNED INTERVENTIONS: Therapeutic exercises, Therapeutic activity, Neuromuscular re-education, Balance training, Gait training, Patient/Family education, Joint mobilization, Dry Needling, Electrical stimulation, Cryotherapy, Moist heat, Taping, Vasopneumatic  device, Ionotophoresis 68m/ml Dexamethasone, and Manual therapy  HEP  [id number],  [id number] PLAN FOR NEXT SESSION: Re-assess, finalize HEP, possible D/C.  SMarcelina Morel DPT 11/11/2021, 8:42 AM

## 2021-11-16 ENCOUNTER — Encounter: Payer: Self-pay | Admitting: Emergency Medicine

## 2021-11-16 ENCOUNTER — Ambulatory Visit: Payer: BC Managed Care – PPO | Admitting: Emergency Medicine

## 2021-11-16 VITALS — BP 138/82 | HR 73 | Temp 97.7°F | Ht 71.0 in | Wt 288.5 lb

## 2021-11-16 DIAGNOSIS — I10 Essential (primary) hypertension: Secondary | ICD-10-CM | POA: Diagnosis not present

## 2021-11-16 DIAGNOSIS — F411 Generalized anxiety disorder: Secondary | ICD-10-CM

## 2021-11-16 NOTE — Progress Notes (Signed)
Brendan Gregory 41 y.o.   Chief Complaint  Patient presents with   Follow-up    2-3 week f/u appt     HISTORY OF PRESENT ILLNESS: This is a 41 y.o. male here for follow-up of hypertension and panic attacks. Recent medication changes as follows: Valsartan-HCTZ was DC'd and Hygroton 25 mg started instead BuSpar was discontinued and started on Prozac 20 mg daily. States blood pressure is a lot better and panic attacks are gone.  Requesting to get off Prozac. He also requested transfer of care from me to Dr. Sharlet Salina. No other complaints or medical concerns today.  HPI   Prior to Admission medications   Medication Sig Start Date End Date Taking? Authorizing Provider  atorvastatin (LIPITOR) 10 MG tablet Take 1 tablet (10 mg total) by mouth daily. 02/11/21  Yes Turner, Eber Hong, MD  chlorthalidone (HYGROTON) 25 MG tablet Take 1 tablet (25 mg total) by mouth daily. 11/01/21  Yes Hoyt Koch, MD  Cholecalciferol (VITAMIN D3 PO) Take 5,000 Units by mouth daily.   Yes [provider]  FLUoxetine (PROZAC) 20 MG tablet Take 1 tablet (20 mg total) by mouth daily. 11/01/21  Yes Hoyt Koch, MD  folic acid (FOLVITE) 191 MCG tablet Take 400 mcg by mouth daily.   Yes [provider]  magnesium gluconate (MAGONATE) 500 MG tablet Take 500 mg by mouth daily.   Yes [provider]  Omega-3 Fatty Acids (FISH OIL) 1000 MG CAPS Take by mouth daily.   Yes [provider]  OVER THE COUNTER MEDICATION Take 1 capsule by mouth daily at 6 (six) AM. Aged beet root   Yes [provider]    No Known Allergies  Patient Active Problem List   Diagnosis Date Noted   Panic attack 10/25/2021   Generalized anxiety disorder 10/25/2021   Acute pain of left knee 09/02/2021   Solitary pulmonary nodule 05/04/2021   Agatston coronary artery calcium score less than 100 01/25/2021   Nonspecific chest pain 11/24/2020   Morbid obesity ([place]) 11/24/2020    Obstructive sleep apnea on CPAP 05/16/2019   Essential hypertension 05/16/2019    Past Medical History:  Diagnosis Date   Agatston coronary artery calcium score less than 100    Ca score 1.  No CAD on coronary CTA   Hypertension    Panic attacks     No past surgical history on file.  Social History   Socioeconomic History   Marital status: Married    Spouse name: Not on file   Number of children: Not on file   Years of education: Not on file   Highest education level: Not on file  Occupational History   Not on file  Tobacco Use   Smoking status: Never   Smokeless tobacco: Never  Vaping Use   Vaping Use: Former   Substances: THC  Substance and Sexual Activity   Alcohol use: Not Currently   Drug use: Not Currently    Frequency: 3.0 times per week    Types: Marijuana    Comment: quit 3 weeks ago   Sexual activity: Not on file  Other Topics Concern   Not on file  Social History Narrative   Not on file   Social Determinants of Health   Financial Resource Strain: Not on file  Food Insecurity: Not on file  Transportation Needs: Not on file  Physical Activity: Not on file  Stress: Not on file  Social Connections: Not on file  Intimate Partner  Violence: Not on file    Family History  Problem Relation Age of Onset   Healthy Mother    Prostate cancer Father    Diabetes Father    Hypertension Father    Diabetes Brother    Hypertension Brother    Diabetes Maternal Grandfather      Review of Systems  Constitutional: Negative.  Negative for chills and fever.  HENT: Negative.  Negative for congestion and sore throat.   Respiratory: Negative.  Negative for cough and shortness of breath.   Cardiovascular: Negative.  Negative for chest pain.  Gastrointestinal:  Negative for abdominal pain, diarrhea, nausea and vomiting.  Genitourinary: Negative.   Skin: Negative.  Negative for rash.  Neurological:  Negative for dizziness and headaches.  All other systems  reviewed and are negative.  Today's Vitals   11/16/21 0841  BP: 138/82  Pulse: 73  Temp: 97.7 F (36.5 C)  TempSrc: Oral  SpO2: 98%  Weight: 288 lb 8 oz (130.9 kg)  Height: '5\' 11"'$  (1.803 m)   Body mass index is 40.24 kg/m.   Physical Exam Vitals reviewed.  Constitutional:      Appearance: Normal appearance.  HENT:     Head: Normocephalic.  Eyes:     Extraocular Movements: Extraocular movements intact.  Cardiovascular:     Rate and Rhythm: Normal rate.  Pulmonary:     Effort: Pulmonary effort is normal.  Skin:    General: Skin is warm and dry.     Capillary Refill: Capillary refill takes less than 2 seconds.  Neurological:     General: No focal deficit present.     Mental Status: He is alert and oriented to person, place, and time.  Psychiatric:        Mood and Affect: Mood normal.        Behavior: Behavior normal.      ASSESSMENT & PLAN: Problem List Items Addressed This Visit       Cardiovascular and Mediastinum   Essential hypertension - Primary    Well-controlled hypertension. Continue Hygroton 25 mg daily. BP Readings from Last 3 Encounters:  11/16/21 138/82  11/01/21 136/80  10/27/21 (!) 148/88  Dietary approaches to stop hypertension discussed.         Other   Generalized anxiety disorder    Much better without any panic attacks. Taking Prozac 20 mg daily but wants to get off it. Has been on Prozac for 2 weeks. May be able to wean off in 1 to 2 weeks if so desired.      Patient Instructions  Hypertension, Adult High blood pressure (hypertension) is when the force of blood pumping through the arteries is too strong. The arteries are the blood vessels that carry blood from the heart throughout the body. Hypertension forces the heart to work harder to pump blood and may cause arteries to become narrow or stiff. Untreated or uncontrolled hypertension can lead to a heart attack, heart failure, a stroke, kidney disease, and other problems. A  blood pressure reading consists of a higher number over a lower number. Ideally, your blood pressure should be below 120/80. The first ("top") number is called the systolic pressure. It is a measure of the pressure in your arteries as your heart beats. The second ("bottom") number is called the diastolic pressure. It is a measure of the pressure in your arteries as the heart relaxes. What are the causes? The exact cause of this condition is not known. There are some conditions that  result in high blood pressure. What increases the risk? Certain factors may make you more likely to develop high blood pressure. Some of these risk factors are under your control, including: Smoking. Not getting enough exercise or physical activity. Being overweight. Having too much fat, sugar, calories, or salt (sodium) in your diet. Drinking too much alcohol. Other risk factors include: Having a personal history of heart disease, diabetes, high cholesterol, or kidney disease. Stress. Having a family history of high blood pressure and high cholesterol. Having obstructive sleep apnea. Age. The risk increases with age. What are the signs or symptoms? High blood pressure may not cause symptoms. Very high blood pressure (hypertensive crisis) may cause: Headache. Fast or irregular heartbeats (palpitations). Shortness of breath. Nosebleed. Nausea and vomiting. Vision changes. Severe chest pain, dizziness, and seizures. How is this diagnosed? This condition is diagnosed by measuring your blood pressure while you are seated, with your arm resting on a flat surface, your legs uncrossed, and your feet flat on the floor. The cuff of the blood pressure monitor will be placed directly against the skin of your upper arm at the level of your heart. Blood pressure should be measured at least twice using the same arm. Certain conditions can cause a difference in blood pressure between your right and left arms. If you have a  high blood pressure reading during one visit or you have normal blood pressure with other risk factors, you may be asked to: Return on a different day to have your blood pressure checked again. Monitor your blood pressure at home for 1 week or longer. If you are diagnosed with hypertension, you may have other blood or imaging tests to help your health care provider understand your overall risk for other conditions. How is this treated? This condition is treated by making healthy lifestyle changes, such as eating healthy foods, exercising more, and reducing your alcohol intake. You may be referred for counseling on a healthy diet and physical activity. Your health care provider may prescribe medicine if lifestyle changes are not enough to get your blood pressure under control and if: Your systolic blood pressure is above 130. Your diastolic blood pressure is above 80. Your personal target blood pressure may vary depending on your medical conditions, your age, and other factors. Follow these instructions at home: Eating and drinking  Eat a diet that is high in fiber and potassium, and low in sodium, added sugar, and fat. An example of this eating plan is called the DASH diet. DASH stands for Dietary Approaches to Stop Hypertension. To eat this way: Eat plenty of fresh fruits and vegetables. Try to fill one half of your plate at each meal with fruits and vegetables. Eat whole grains, such as whole-wheat pasta, brown rice, or whole-grain bread. Fill about one fourth of your plate with whole grains. Eat or drink low-fat dairy products, such as skim milk or low-fat yogurt. Avoid fatty cuts of meat, processed or cured meats, and poultry with skin. Fill about one fourth of your plate with lean proteins, such as fish, chicken without skin, beans, eggs, or tofu. Avoid pre-made and processed foods. These tend to be higher in sodium, added sugar, and fat. Reduce your daily sodium intake. Many people with  hypertension should eat less than 1,500 mg of sodium a day. Do not drink alcohol if: Your health care provider tells you not to drink. You are pregnant, may be pregnant, or are planning to become pregnant. If you drink alcohol: Limit how  much you have to: 0-1 drink a day for women. 0-2 drinks a day for men. Know how much alcohol is in your drink. In the [place] one drink equals one 12 oz bottle of beer (355 mL), one 5 oz glass of wine (148 mL), or one 1 oz glass of hard liquor (44 mL). Lifestyle  Work with your health care provider to maintain a healthy body weight or to lose weight. Ask what an ideal weight is for you. Get at least 30 minutes of exercise that causes your heart to beat faster (aerobic exercise) most days of the week. Activities may include walking, swimming, or biking. Include exercise to strengthen your muscles (resistance exercise), such as Pilates or lifting weights, as part of your weekly exercise routine. Try to do these types of exercises for 30 minutes at least 3 days a week. Do not use any products that contain nicotine or tobacco. These products include cigarettes, chewing tobacco, and vaping devices, such as e-cigarettes. If you need help quitting, ask your health care provider. Monitor your blood pressure at home as told by your health care provider. Keep all follow-up visits. This is important. Medicines Take over-the-counter and prescription medicines only as told by your health care provider. Follow directions carefully. Blood pressure medicines must be taken as prescribed. Do not skip doses of blood pressure medicine. Doing this puts you at risk for problems and can make the medicine less effective. Ask your health care provider about side effects or reactions to medicines that you should watch for. Contact a health care provider if you: Think you are having a reaction to a medicine you are taking. Have headaches that keep coming back (recurring). Feel  dizzy. Have swelling in your ankles. Have trouble with your vision. Get help right away if you: Develop a severe headache or confusion. Have unusual weakness or numbness. Feel faint. Have severe pain in your chest or abdomen. Vomit repeatedly. Have trouble breathing. These symptoms may be an emergency. Get help right away. Call 911. Do not wait to see if the symptoms will go away. Do not drive yourself to the hospital. Summary Hypertension is when the force of blood pumping through your arteries is too strong. If this condition is not controlled, it may put you at risk for serious complications. Your personal target blood pressure may vary depending on your medical conditions, your age, and other factors. For most people, a normal blood pressure is less than 120/80. Hypertension is treated with lifestyle changes, medicines, or a combination of both. Lifestyle changes include losing weight, eating a healthy, low-sodium diet, exercising more, and limiting alcohol. This information is not intended to replace advice given to you by your health care provider. Make sure you discuss any questions you have with your health care provider. Document Revised: 01/26/2021 Document Reviewed: 01/26/2021 Elsevier Patient Education  [place], MD [facility]

## 2021-11-16 NOTE — Assessment & Plan Note (Signed)
Much better without any panic attacks. Taking Prozac 20 mg daily but wants to get off it. Has been on Prozac for 2 weeks. May be able to wean off in 1 to 2 weeks if so desired.

## 2021-11-16 NOTE — Patient Instructions (Signed)
Hypertension, Adult High blood pressure (hypertension) is when the force of blood pumping through the arteries is too strong. The arteries are the blood vessels that carry blood from the heart throughout the body. Hypertension forces the heart to work harder to pump blood and may cause arteries to become narrow or stiff. Untreated or uncontrolled hypertension can lead to a heart attack, heart failure, a stroke, kidney disease, and other problems. A blood pressure reading consists of a higher number over a lower number. Ideally, your blood pressure should be below 120/80. The first ("top") number is called the systolic pressure. It is a measure of the pressure in your arteries as your heart beats. The second ("bottom") number is called the diastolic pressure. It is a measure of the pressure in your arteries as the heart relaxes. What are the causes? The exact cause of this condition is not known. There are some conditions that result in high blood pressure. What increases the risk? Certain factors may make you more likely to develop high blood pressure. Some of these risk factors are under your control, including: Smoking. Not getting enough exercise or physical activity. Being overweight. Having too much fat, sugar, calories, or salt (sodium) in your diet. Drinking too much alcohol. Other risk factors include: Having a personal history of heart disease, diabetes, high cholesterol, or kidney disease. Stress. Having a family history of high blood pressure and high cholesterol. Having obstructive sleep apnea. Age. The risk increases with age. What are the signs or symptoms? High blood pressure may not cause symptoms. Very high blood pressure (hypertensive crisis) may cause: Headache. Fast or irregular heartbeats (palpitations). Shortness of breath. Nosebleed. Nausea and vomiting. Vision changes. Severe chest pain, dizziness, and seizures. How is this diagnosed? This condition is diagnosed by  measuring your blood pressure while you are seated, with your arm resting on a flat surface, your legs uncrossed, and your feet flat on the floor. The cuff of the blood pressure monitor will be placed directly against the skin of your upper arm at the level of your heart. Blood pressure should be measured at least twice using the same arm. Certain conditions can cause a difference in blood pressure between your right and left arms. If you have a high blood pressure reading during one visit or you have normal blood pressure with other risk factors, you may be asked to: Return on a different day to have your blood pressure checked again. Monitor your blood pressure at home for 1 week or longer. If you are diagnosed with hypertension, you may have other blood or imaging tests to help your health care provider understand your overall risk for other conditions. How is this treated? This condition is treated by making healthy lifestyle changes, such as eating healthy foods, exercising more, and reducing your alcohol intake. You may be referred for counseling on a healthy diet and physical activity. Your health care provider may prescribe medicine if lifestyle changes are not enough to get your blood pressure under control and if: Your systolic blood pressure is above 130. Your diastolic blood pressure is above 80. Your personal target blood pressure may vary depending on your medical conditions, your age, and other factors. Follow these instructions at home: Eating and drinking  Eat a diet that is high in fiber and potassium, and low in sodium, added sugar, and fat. An example of this eating plan is called the DASH diet. DASH stands for Dietary Approaches to Stop Hypertension. To eat this way: Eat   plenty of fresh fruits and vegetables. Try to fill one half of your plate at each meal with fruits and vegetables. Eat whole grains, such as whole-wheat pasta, brown rice, or whole-grain bread. Fill about one  fourth of your plate with whole grains. Eat or drink low-fat dairy products, such as skim milk or low-fat yogurt. Avoid fatty cuts of meat, processed or cured meats, and poultry with skin. Fill about one fourth of your plate with lean proteins, such as fish, chicken without skin, beans, eggs, or tofu. Avoid pre-made and processed foods. These tend to be higher in sodium, added sugar, and fat. Reduce your daily sodium intake. Many people with hypertension should eat less than 1,500 mg of sodium a day. Do not drink alcohol if: Your health care provider tells you not to drink. You are pregnant, may be pregnant, or are planning to become pregnant. If you drink alcohol: Limit how much you have to: 0-1 drink a day for women. 0-2 drinks a day for men. Know how much alcohol is in your drink. In the [place] one drink equals one 12 oz bottle of beer (355 mL), one 5 oz glass of wine (148 mL), or one 1 oz glass of hard liquor (44 mL). Lifestyle  Work with your health care provider to maintain a healthy body weight or to lose weight. Ask what an ideal weight is for you. Get at least 30 minutes of exercise that causes your heart to beat faster (aerobic exercise) most days of the week. Activities may include walking, swimming, or biking. Include exercise to strengthen your muscles (resistance exercise), such as Pilates or lifting weights, as part of your weekly exercise routine. Try to do these types of exercises for 30 minutes at least 3 days a week. Do not use any products that contain nicotine or tobacco. These products include cigarettes, chewing tobacco, and vaping devices, such as e-cigarettes. If you need help quitting, ask your health care provider. Monitor your blood pressure at home as told by your health care provider. Keep all follow-up visits. This is important. Medicines Take over-the-counter and prescription medicines only as told by your health care provider. Follow directions carefully. Blood  pressure medicines must be taken as prescribed. Do not skip doses of blood pressure medicine. Doing this puts you at risk for problems and can make the medicine less effective. Ask your health care provider about side effects or reactions to medicines that you should watch for. Contact a health care provider if you: Think you are having a reaction to a medicine you are taking. Have headaches that keep coming back (recurring). Feel dizzy. Have swelling in your ankles. Have trouble with your vision. Get help right away if you: Develop a severe headache or confusion. Have unusual weakness or numbness. Feel faint. Have severe pain in your chest or abdomen. Vomit repeatedly. Have trouble breathing. These symptoms may be an emergency. Get help right away. Call 911. Do not wait to see if the symptoms will go away. Do not drive yourself to the hospital. Summary Hypertension is when the force of blood pumping through your arteries is too strong. If this condition is not controlled, it may put you at risk for serious complications. Your personal target blood pressure may vary depending on your medical conditions, your age, and other factors. For most people, a normal blood pressure is less than 120/80. Hypertension is treated with lifestyle changes, medicines, or a combination of both. Lifestyle changes include losing weight, eating a healthy,   low-sodium diet, exercising more, and limiting alcohol. This information is not intended to replace advice given to you by your health care provider. Make sure you discuss any questions you have with your health care provider. Document Revised: 01/26/2021 Document Reviewed: 01/26/2021 Elsevier Patient Education  2023 [employer]vier Inc[employer]  

## 2021-11-16 NOTE — Assessment & Plan Note (Signed)
Well-controlled hypertension. Continue Hygroton 25 mg daily. BP Readings from Last 3 Encounters:  11/16/21 138/82  11/01/21 136/80  10/27/21 (!) 148/88  Dietary approaches to stop hypertension discussed.

## 2021-11-18 ENCOUNTER — Encounter: Payer: Self-pay | Admitting: Physical Therapy

## 2021-11-18 ENCOUNTER — Ambulatory Visit: Payer: BC Managed Care – PPO | Admitting: Physical Therapy

## 2021-11-18 DIAGNOSIS — R293 Abnormal posture: Secondary | ICD-10-CM

## 2021-11-18 DIAGNOSIS — R262 Difficulty in walking, not elsewhere classified: Secondary | ICD-10-CM | POA: Diagnosis not present

## 2021-11-18 DIAGNOSIS — R600 Localized edema: Secondary | ICD-10-CM | POA: Diagnosis not present

## 2021-11-18 DIAGNOSIS — R2689 Other abnormalities of gait and mobility: Secondary | ICD-10-CM

## 2021-11-18 DIAGNOSIS — R2681 Unsteadiness on feet: Secondary | ICD-10-CM

## 2021-11-18 DIAGNOSIS — M25562 Pain in left knee: Secondary | ICD-10-CM | POA: Diagnosis not present

## 2021-11-18 DIAGNOSIS — M6281 Muscle weakness (generalized): Secondary | ICD-10-CM | POA: Diagnosis not present

## 2021-11-18 DIAGNOSIS — R278 Other lack of coordination: Secondary | ICD-10-CM | POA: Diagnosis not present

## 2021-11-18 NOTE — Therapy (Signed)
OUTPATIENT PHYSICAL THERAPY LOWER EXTREMITY   PHYSICAL THERAPY DISCHARGE SUMMARY  Visits from Start of Care: 8  Current functional level related to goals / functional outcomes: Patient reports knee pain largely resolved, no longer limits him.   Remaining deficits: Occasional popping when in full Wb through L knee   Education / Equipment: HEP   Patient agrees to discharge. Patient goals were met. Patient is being discharged due to meeting the stated rehab goals.   Patient Name: Brendan Gregory MRN: [id number] DOB:10/19/1980, 41 y.o., male Today's Date: 11/18/2021   PT End of Session - 11/18/21 0847     Visit Number 8    Date for PT Re-Evaluation 12/02/21    PT Start Time 0845    PT Stop Time 0927    PT Time Calculation (min) 42 min    Activity Tolerance Patient tolerated treatment well    Behavior During Therapy [facility] for tasks assessed/performed                   Past Medical History:  Diagnosis Date   Agatston coronary artery calcium score less than 100    Ca score 1.  No CAD on coronary CTA   Hypertension    Panic attacks    History reviewed. No pertinent surgical history. Patient Active Problem List   Diagnosis Date Noted   Panic attack 10/25/2021   Generalized anxiety disorder 10/25/2021   Solitary pulmonary nodule 05/04/2021   Agatston coronary artery calcium score less than 100 01/25/2021   Morbid obesity ([place]) 11/24/2020   Obstructive sleep apnea on CPAP 05/16/2019   Essential hypertension 05/16/2019    PCP: Agustina Caroli  REFERRING PROVIDER: Agustina Caroli  REFERRING DIAG: [phone]  THERAPY DIAG:  Abnormal posture  Other lack of coordination  Unsteadiness on feet  Acute pain of left knee  Difficulty in walking, not elsewhere classified  Muscle weakness (generalized)  Other abnormalities of gait and mobility  Localized edema  Rationale for Evaluation and Treatment Rehabilitation  ONSET DATE: 09/02/21  SUBJECTIVE:    SUBJECTIVE STATEMENT: Patient reports knee pain appears to be resolved.  PERTINENT HISTORY: HTN, obesity, anxiety   PAIN:  Are you having pain? No  PRECAUTIONS: None  WEIGHT BEARING RESTRICTIONS No  FALLS:  Has patient fallen in last 6 months? No  LIVING ENVIRONMENT: Lives with: lives with their family Lives in: House/apartment Stairs: Yes: Internal: 3 flights steps; can reach both Has following equipment at home: None  OCCUPATION: remote IT work  PLOF: Independent  PATIENT GOALS not have the knee pain anymore,    OBJECTIVE:  full left knee ROM, full squat painfree and 5/5 left knee MMT    TODAY'S TREATMENT:  11/18/21 NuStep L5 x 6 min Education for F/U with HEP [place] at 1.2 w/cm2 to L knee x 7 minutes   11/11/21 NuStep L5 x 6 minutes U knee flex, L 25#, 2 x 10 reps U knee ext, L 10#, 2 x 10 reps Bridging x 10, U bridge x 10 reps each SLR with hip ER, 2 x 10 reps Bird dogs 5 x 10 sec each side. [place] at 1.2 W/cm2 to L knee x 7 minutes  11/04/21 NuStep L5 x 6 minutes Quadruped, shift back onto heels for quad stretch Leg out, alternating Bird dog- 10 reps U knee flex 25#, 2 x 10 reps U knee ext 10#, 2 x 10 reps [place] to L knee, 1.2 w/cm2 x 7 minutes    10/28/21 Recumbent bike L3.5 x 6  minutes. Supine SLR 5#, 2 x 10 reps SLR 3#, 2 x 10 reps Bridge with abd against red Tband 2 x 10 reps Sit <> stand 2 x 10 [place] to L knee 1.2 w/cm2 x 7 minutes   10/19/21 Recumbent bike L4 x 6 minutes. Patellar assessment-L patellar compression test pos, also C/O pain in patellar tendon and sup/lat to patella with patellar depression. Supine SLR, 5# 1 x 10 reps, ER with 3#, 2 x 10 reps. Cybex knee flexion B 25#, 2 x 10, knee ext L 10# 2 x 10 reps. TKE with ball 2 x 10 reps Sit <> stand with Gr theraband resistance in hip abd, 10 reps, Side to side step with green Tband resistance 10 reps [place] @ 1.2 w/cm to L knee around sup, lat, inf kneecap, including patellar tendon. Declined  ionto today  10/13/21 Nu-Step L5 x 6 minutes L TKE against ball in stand, 2 x 10 reps Knee flexion B 45#, 15 reps, had to stop due to pain. Knee ext with slow release, LLE 15#, 2 x 10 reps. [place] to patellar tendon and ant/lateral joint line 7 min, 1.2 w/cm2. Ionto patch  10/01/21 Fibular head mobs- good ROM Patellar mobs - good motion and tracking, very tender over patellar tendon [place] to left patellar tendon 1.2 w/cm 2 100 % cont ( some left knee jumping over lateral aspect) Ionto patch 1.1 cc dex 4 hour leave on patch left pat tendon- needed tape to hold in place to added KT tape to unload patellla    Eval-POC    PATIENT EDUCATION:  Education details: POC Person educated: Patient Education method: Customer service manager Education comprehension: verbalized understanding   HOME EXERCISE PROGRAM: [id number], [id number]  ASSESSMENT:  CLINICAL IMPRESSION:  Patient has met all LTG, reports l knee pain is largely resolved and he is walking up to 4 miles per day. D/C PT.  OBJECTIVE IMPAIRMENTS pain.   ACTIVITY LIMITATIONS bed mobility, caring for others, and bathing his daughter and getting up from floor sometimes  PERSONAL FACTORS 1-2 comorbidities: HTN and unable to reproduce pain  are also affecting patient's functional outcome.   REHAB POTENTIAL: Fair depending on if we can find the source of pain to treat  CLINICAL DECISION MAKING: Evolving/moderate complexity  EVALUATION COMPLEXITY: Moderate  GOALS: Goals reviewed with patient? No  SHORT TERM GOALS: Target date: 10/12/21  Patient will be independent with initial HEP. Goal status: met   LONG TERM GOALS: Target date: 10/26/21  1.  Patient will report at least 75% improvement in L knee pain to improve QOL. Goal status: met    2.  Patient will demonstrate improved functional LE movement to be able to kneel onto L knee without pain.  Baseline:  Goal status: met   3.  Patient will report 66 on FOTO (patient  reported outcome measure) to demonstrate improved functional ability. Baseline: 98 Goal status: met    PLAN: PT FREQUENCY: 1-2x/week  PT DURATION: 4 weeks  PLANNED INTERVENTIONS: Therapeutic exercises, Therapeutic activity, Neuromuscular re-education, Balance training, Gait training, Patient/Family education, Joint mobilization, Dry Needling, Electrical stimulation, Cryotherapy, Moist heat, Taping, Vasopneumatic device, Ionotophoresis 84m/ml Dexamethasone, and Manual therapy  HEP  [id number],  [id number] PLAN FOR NEXT SESSION:   SMarcelina Morel DPT 11/18/2021, 9:20 AM

## 2021-11-22 DIAGNOSIS — F411 Generalized anxiety disorder: Secondary | ICD-10-CM | POA: Diagnosis not present

## 2021-11-27 DIAGNOSIS — G4733 Obstructive sleep apnea (adult) (pediatric): Secondary | ICD-10-CM | POA: Diagnosis not present

## 2021-12-09 DIAGNOSIS — F411 Generalized anxiety disorder: Secondary | ICD-10-CM | POA: Diagnosis not present

## 2021-12-22 DIAGNOSIS — G4733 Obstructive sleep apnea (adult) (pediatric): Secondary | ICD-10-CM | POA: Diagnosis not present

## 2021-12-30 DIAGNOSIS — F411 Generalized anxiety disorder: Secondary | ICD-10-CM | POA: Diagnosis not present

## 2022-01-13 DIAGNOSIS — F411 Generalized anxiety disorder: Secondary | ICD-10-CM | POA: Diagnosis not present

## 2022-01-18 DIAGNOSIS — G4733 Obstructive sleep apnea (adult) (pediatric): Secondary | ICD-10-CM | POA: Diagnosis not present

## 2022-01-19 ENCOUNTER — Other Ambulatory Visit: Payer: Self-pay | Admitting: Cardiology

## 2022-01-25 ENCOUNTER — Other Ambulatory Visit: Payer: Self-pay | Admitting: Cardiology

## 2022-01-27 ENCOUNTER — Other Ambulatory Visit: Payer: Self-pay | Admitting: Internal Medicine

## 2022-01-27 DIAGNOSIS — F411 Generalized anxiety disorder: Secondary | ICD-10-CM | POA: Diagnosis not present

## 2022-02-16 ENCOUNTER — Other Ambulatory Visit: Payer: Self-pay | Admitting: Cardiology

## 2022-02-25 ENCOUNTER — Telehealth: Payer: BC Managed Care – PPO | Admitting: Family Medicine

## 2022-02-25 DIAGNOSIS — M545 Low back pain, unspecified: Secondary | ICD-10-CM

## 2022-02-25 MED ORDER — CYCLOBENZAPRINE HCL 10 MG PO TABS: 10.0000 mg | ORAL_TABLET | Freq: Three times a day (TID) | ORAL | 0 refills | Status: DC | PRN

## 2022-02-25 MED ORDER — PREDNISONE 20 MG PO TABS: 20.0000 mg | ORAL_TABLET | Freq: Two times a day (BID) | ORAL | 0 refills | Status: AC

## 2022-02-25 NOTE — Progress Notes (Signed)
Virtual Visit Consent   Brendan Gregory, you are scheduled for a virtual visit with a [facility] provider today. Just as with appointments in the office, your consent must be obtained to participate. Your consent will be active for this visit and any virtual visit you may have with one of our providers in the next 365 days. If you have a MyChart account, a copy of this consent can be sent to you electronically.  As this is a virtual visit, video technology does not allow for your provider to perform a traditional examination. This may limit your provider's ability to fully assess your condition. If your provider identifies any concerns that need to be evaluated in person or the need to arrange testing (such as labs, EKG, etc.), we will make arrangements to do so. Although advances in technology are sophisticated, we cannot ensure that it will always work on either your end or our end. If the connection with a video visit is poor, the visit may have to be switched to a telephone visit. With either a video or telephone visit, we are not always able to ensure that we have a secure connection.  By engaging in this virtual visit, you consent to the provision of healthcare and authorize for your insurance to be billed (if applicable) for the services provided during this visit. Depending on your insurance coverage, you may receive a charge related to this service.  I need to obtain your verbal consent now. Are you willing to proceed with your visit today? Rollins Wrightson has provided verbal consent on 02/25/2022 for a virtual visit (video or telephone). Dellia Nims, FNP  Date: 02/25/2022 9:09 AM  Virtual Visit via Video Note   I, Dellia Nims, connected with  Brendan Gregory  ([id number], 17-Mar-1981) on 02/25/22 at  9:00 AM EST by a video-enabled telemedicine application and verified that I am speaking with the correct person using two identifiers.  Location: Patient: Virtual Visit Location Patient:  Home Provider: Virtual Visit Location Provider: Home Office   I discussed the limitations of evaluation and management by telemedicine and the availability of in person appointments. The patient expressed understanding and agreed to proceed.    History of Present Illness: Brendan Gregory is a 41 y.o. who identifies as a male who was assigned male at birth, and is being seen today for low back pain in left lower back coming up into thoracic region. He reports that he had the same issues a year ago and the meds given resolved the issue. He and his wife moved to [facility] and he strained his back. Marland Kitchen  HPI: HPI  Problems:  Patient Active Problem List   Diagnosis Date Noted   Panic attack 10/25/2021   Generalized anxiety disorder 10/25/2021   Solitary pulmonary nodule 05/04/2021   Agatston coronary artery calcium score less than 100 01/25/2021   Morbid obesity ([place]) 11/24/2020   Obstructive sleep apnea on CPAP 05/16/2019   Essential hypertension 05/16/2019    Allergies: No Known Allergies Medications:  Current Outpatient Medications:    atorvastatin (LIPITOR) 10 MG tablet, Take 1 tablet (10 mg total) by mouth daily., Disp: 30 tablet, Rfl: 0   chlorthalidone (HYGROTON) 25 MG tablet, TAKE 1 TABLET (25 MG TOTAL) BY MOUTH DAILY., Disp: 90 tablet, Rfl: 0   Cholecalciferol (VITAMIN D3 PO), Take 5,000 Units by mouth daily., Disp: , Rfl:    FLUoxetine (PROZAC) 20 MG tablet, Take 1 tablet (20 mg total) by mouth daily., Disp: 30 tablet, Rfl: 0  folic acid (FOLVITE) 341 MCG tablet, Take 400 mcg by mouth daily., Disp: , Rfl:    magnesium gluconate (MAGONATE) 500 MG tablet, Take 500 mg by mouth daily., Disp: , Rfl:    Omega-3 Fatty Acids (FISH OIL) 1000 MG CAPS, Take by mouth daily., Disp: , Rfl:    OVER THE COUNTER MEDICATION, Take 1 capsule by mouth daily at 6 (six) AM. Aged beet root, Disp: , Rfl:   Observations/Objective: Patient is well-developed, well-nourished in no acute distress.  Resting  comfortably  at home.  Head is normocephalic, atraumatic.  No labored breathing. Speech is clear and coherent with logical content.  Patient is alert and oriented at baseline.    Assessment and Plan: 1. Acute left-sided low back pain without sciatica  Heat to lower back, no ibuprofen or aleve while on ibuprofen, see med use and side effects, urgent care if sx persist or worsen.   Follow Up Instructions: I discussed the assessment and treatment plan with the patient. The patient was provided an opportunity to ask questions and all were answered. The patient agreed with the plan and demonstrated an understanding of the instructions.  A copy of instructions were sent to the patient via MyChart unless otherwise noted below.     The patient was advised to call back or seek an in-person evaluation if the symptoms worsen or if the condition fails to improve as anticipated.  Time:  I spent 10 minutes with the patient via telehealth technology discussing the above problems/concerns.    Dellia Nims, FNP

## 2022-02-25 NOTE — Patient Instructions (Signed)
Lumbar Strain A lumbar strain, which is sometimes called a low-back strain, is a stretch or tear in a muscle or tendons in the lower back (lumbar spine). Tendons are the strong cords of tissue that attach muscle to bone. This type of injury occurs when muscles or tendons are torn or are stretched beyond their limits. Lumbar strains can range from mild to severe. Mild strains may involve stretching a muscle or tendon without tearing it. These may heal in 1-2 weeks. More severe strains involve tearing of muscle fibers or tendons. These will cause more pain and may take 6-8 weeks to heal. What are the causes? This condition may be caused by: Trauma, such as a fall or a hit to the body. Twisting or overstretching the back. This may result from doing activities that take a lot of energy, such as lifting heavy objects. What increases the risk? A lumbar strain is more common in: Athletes. People with obesity. People who do repeated lifting, bending, or other movements that involve their back. What are the signs or symptoms? Symptoms of this condition may include: Sharp or dull pain in the lower back that does not go away. The pain may spread to the buttocks. Stiffness or limited range of motion. Sudden muscle tightening (spasms). How is this diagnosed? This condition may be diagnosed based on: Your symptoms. Your medical history. A physical exam. Imaging tests, such as: X-rays. MRI. How is this treated? Treatment for this condition may include: Rest. Applying heat and cold to the affected area. Over-the-counter medicines to help with pain and inflammation, such as NSAIDs. Prescription medicine for pain or to relax the muscles. These may be needed for a short time. Physical therapy exercises to improve movement and strength. Follow these instructions at home: Managing pain, stiffness, and swelling     If told, put ice on the injured area during the first 24 hours after your injury. Put  ice in a plastic bag. Place a towel between your skin and the bag. Leave the ice on for 20 minutes, 2-3 times a day. If told, apply heat to the affected area as often as told by your health care provider. Use the heat source that your health care provider recommends, such as a moist heat pack or a heating pad. Place a towel between your skin and the heat source. Leave the heat on for 20-30 minutes. If your skin turns bright red, remove the ice or heat right away to prevent skin damage. The risk of damage is higher if you cannot feel pain, heat, or cold. Activity Rest and return to your normal activities as told by your health care provider. Ask your health care provider what activities are safe for you. Do exercises as told by your health care provider. General instructions Take over-the-counter and prescription medicines only as told by your health care provider. Ask your health care provider if the medicine prescribed to you: Requires you to avoid driving or using machinery. Can cause constipation. You may need to take these actions to prevent or treat constipation: Drink enough fluid to keep your urine pale yellow. Take over-the-counter or prescription medicines. Eat foods that are high in fiber, such as beans, whole grains, and fresh fruits and vegetables. Limit foods that are high in fat and processed sugars, such as fried or sweet foods. Do not use any products that contain nicotine or tobacco. These products include cigarettes, chewing tobacco, and vaping devices, such as e-cigarettes. If you need help quitting, ask your  health care provider. How is this prevented? To prevent a future low-back injury: Always warm up properly before physical activity or sports. Cool down and stretch after being active. Use correct form when playing sports and lifting heavy objects. Bend your knees before you lift heavy objects. Use good posture when sitting and standing. Stay physically fit and keep a  healthy weight. Do at least 150 minutes of moderate intensity exercise each week, such as brisk walking or water aerobics. Do strength exercises at least 2 times each week. Contact a health care provider if: Your back pain does not improve after several weeks of treatment. Your symptoms get worse. You have a fever. Get help right away if: Your back pain is severe. You are unable to stand or walk. You develop pain in your legs. You have weakness in your buttocks or legs. You have trouble controlling when you urinate or when you have a bowel movement. You have frequent, painful, or bloody urination. This information is not intended to replace advice given to you by your health care provider. Make sure you discuss any questions you have with your health care provider. Document Revised: 10/12/2021 Document Reviewed: 10/12/2021 Elsevier Patient Education  [place].

## 2022-03-06 ENCOUNTER — Other Ambulatory Visit: Payer: Self-pay

## 2022-03-06 ENCOUNTER — Ambulatory Visit
Admission: EM | Admit: 2022-03-06 | Discharge: 2022-03-06 | Disposition: A | Discharge status: Home / Self Care | Payer: BC Managed Care – PPO | Attending: Family Medicine | Admitting: Family Medicine

## 2022-03-06 DIAGNOSIS — J069 Acute upper respiratory infection, unspecified: Secondary | ICD-10-CM | POA: Diagnosis not present

## 2022-03-06 DIAGNOSIS — Z1152 Encounter for screening for COVID-19: Secondary | ICD-10-CM | POA: Diagnosis not present

## 2022-03-06 DIAGNOSIS — R030 Elevated blood-pressure reading, without diagnosis of hypertension: Secondary | ICD-10-CM | POA: Diagnosis not present

## 2022-03-06 DIAGNOSIS — R0602 Shortness of breath: Secondary | ICD-10-CM | POA: Diagnosis not present

## 2022-03-06 DIAGNOSIS — R059 Cough, unspecified: Secondary | ICD-10-CM | POA: Diagnosis not present

## 2022-03-06 DIAGNOSIS — E860 Dehydration: Secondary | ICD-10-CM | POA: Diagnosis not present

## 2022-03-06 DIAGNOSIS — R531 Weakness: Secondary | ICD-10-CM | POA: Diagnosis not present

## 2022-03-06 LAB — RESP PANEL BY RT-PCR (FLU A&B, COVID) ARPGX2
Influenza A by PCR: NEGATIVE
Influenza B by PCR: NEGATIVE
SARS Coronavirus 2 by RT PCR: NEGATIVE

## 2022-03-06 NOTE — Discharge Instructions (Addendum)
Take plain guaifenesin ('1200mg'$  extended release tabs such as Mucinex) twice daily, with plenty of water, for cough and congestion. Get adequate rest.   For increasing nasal congestion may use Afrin nasal spray (or generic oxymetazoline) each morning for about 5 days and then discontinue.  Also recommend using saline nasal spray several times daily and saline nasal irrigation (AYR is a common brand).  Use Flonase nasal spray each morning after using Afrin nasal spray and saline nasal irrigation. Try warm salt water gargles if sore throat develops. Stop all antihistamines for now, and other non-prescription cough/cold preparations. May take Delsym Cough Suppressant ("12 Hour Cough Relief") at bedtime for nighttime cough.  May take Ibuprofen '200mg'$ , 4 tabs every 8 hours with food for body aches, fever, and lower back pain.   If your COVID-19 test is positive, isolate yourself for five days from the time of your symptom onset.  At the end of five days you may end isolation if your symptoms have cleared or improved, and you have not had a fever for 24 hours. At this time you should wear a mask for five more days when you are around others.   If symptoms become significantly worse during the night or over the weekend, proceed to the local emergency room.

## 2022-03-06 NOTE — ED Triage Notes (Addendum)
Pt reports feeling fatigue, body aches and nausea onset for two days.  He completed 2 at home Covid tests yesterday, both tests were negative.

## 2022-03-06 NOTE — ED Provider Notes (Signed)
[facility]    CSN: [id number] Arrival date & time: 03/06/22  0086      History   Chief Complaint Chief Complaint  Patient presents with   Fatigue    HPI Brendan Gregory is a 41 y.o. male.   Patient reports that two weeks ago he had a mild low back injury, and was treated for a lower back strain with a course of prednisone and Flexeril.  He was improving somewhat until yesterday he began to become "shaky," developed a headache, "brain fog," and nausea without vomiting.  He wondered at that time if his symptoms were related to taking prednisone. He has now developed fatigue, myalgias, sinus congestion, and occasional cough.  He completed two home COVID tests yesterday that were negative.  The history is provided by the patient.    Past Medical History:  Diagnosis Date   Agatston coronary artery calcium score less than 100    Ca score 1.  No CAD on coronary CTA   Hypertension    Panic attacks     Patient Active Problem List   Diagnosis Date Noted   Panic attack 10/25/2021   Generalized anxiety disorder 10/25/2021   Solitary pulmonary nodule 05/04/2021   Agatston coronary artery calcium score less than 100 01/25/2021   Morbid obesity ([place]) 11/24/2020   Obstructive sleep apnea on CPAP 05/16/2019   Essential hypertension 05/16/2019    History reviewed. No pertinent surgical history.     Home Medications    Prior to Admission medications   Medication Sig Start Date End Date Taking? Authorizing Provider  atorvastatin (LIPITOR) 10 MG tablet Take 1 tablet (10 mg total) by mouth daily. 02/16/22   Sueanne Margarita, MD  chlorthalidone (HYGROTON) 25 MG tablet TAKE 1 TABLET (25 MG TOTAL) BY MOUTH DAILY. 01/27/22   Hoyt Koch, MD  Cholecalciferol (VITAMIN D3 PO) Take 5,000 Units by mouth daily.    [provider]  cyclobenzaprine (FLEXERIL) 10 MG tablet Take 1 tablet (10 mg total) by mouth 3 (three) times daily as needed for muscle spasms.  02/25/22   Tempie Hoist, FNP  FLUoxetine (PROZAC) 20 MG tablet Take 1 tablet (20 mg total) by mouth daily. 11/01/21   Hoyt Koch, MD  folic acid (FOLVITE) 761 MCG tablet Take 400 mcg by mouth daily.    [provider]  magnesium gluconate (MAGONATE) 500 MG tablet Take 500 mg by mouth daily.    [provider]  Omega-3 Fatty Acids (FISH OIL) 1000 MG CAPS Take by mouth daily.    [provider]  OVER THE COUNTER MEDICATION Take 1 capsule by mouth daily at 6 (six) AM. Aged beet root    [provider]    Family History Family History  Problem Relation Age of Onset   Healthy Mother    Prostate cancer Father    Diabetes Father    Hypertension Father    Diabetes Brother    Hypertension Brother    Diabetes Maternal Grandfather     Social History Social History   Tobacco Use   Smoking status: Never   Smokeless tobacco: Never  Vaping Use   Vaping Use: Former   Substances: THC  Substance Use Topics   Alcohol use: Not Currently   Drug use: Not Currently    Frequency: 3.0 times per week    Types: Marijuana    Comment: quit 3 weeks ago     Allergies   Patient has no known allergies.  Review of Systems Review of Systems No sore throat + occasional cough No pleuritic pain No wheezing + nasal congestion ? post-nasal drainage No sinus pain/pressure No itchy/red eyes No earache No hemoptysis No SOB No fever/+ chills + nausea No vomiting No abdominal pain No diarrhea No urinary symptoms No skin rash + fatigue + myalgias + headache + left lower back pain   Physical Exam Triage Vital Signs ED Triage Vitals  Enc Vitals Group     BP 03/06/22 0832 136/88     Pulse Rate 03/06/22 0832 95     Resp 03/06/22 0832 17     Temp 03/06/22 0832 99 F (37.2 C)     Temp Source 03/06/22 0832 Oral     SpO2 03/06/22 0832 94 %     Weight 03/06/22 0834 286 lb (129.7 kg)     Height --      Head Circumference --      Peak Flow --       Pain Score 03/06/22 0834 7     Pain Loc --      Pain Edu? --      Excl. in [place]? --    No data found.  Updated Vital Signs BP 136/88 (BP Location: Left Arm)   Pulse 95   Temp 99 F (37.2 C) (Oral)   Resp 17   Wt 129.7 kg   SpO2 94%   BMI 39.89 kg/m   Visual Acuity Right Eye Distance:   Left Eye Distance:   Bilateral Distance:    Right Eye Near:   Left Eye Near:    Bilateral Near:     Physical Exam Nursing notes and Vital Signs reviewed. Appearance:  Patient appears stated age, and in no acute distress Eyes:  Pupils are equal, round, and reactive to light and accomodation.  Extraocular movement is intact.  Conjunctivae are not inflamed  Ears:  Canals normal.  Tympanic membranes normal.  Nose:  Mildly congested turbinates.  No sinus tenderness.   Pharynx:  Normal Neck:  Supple.  Mildly enlarged lateral nodes are present, tender to palpation on the left.   Lungs:  Clear to auscultation.  Breath sounds are equal.  Moving air well. Heart:  Regular rate and rhythm without murmurs, rubs, or gallops.  Abdomen:  Nontender without masses or hepatosplenomegaly.  Bowel sounds are present.  No CVA or flank tenderness.  Extremities:  No edema.  Skin:  No rash present.  Back:  Mild left lumbar tenderness to palpation   UC Treatments / Results  Labs (all labs ordered are listed, but only abnormal results are displayed) Labs Reviewed  RESP PANEL BY RT-PCR (FLU A&B, COVID) [id number]    EKG   Radiology No results found.  Procedures Procedures (including critical care time)  Medications Ordered in UC Medications - No data to display  Initial Impression / Assessment and Plan / UC Course  I have reviewed the triage vital signs and the nursing notes.  Pertinent labs & imaging results that were available during my care of the patient were reviewed by me and considered in my medical decision making (see chart for details).    Benign exam.  There is no evidence of bacterial  infection today.  Treat symptomatically for now. Check COVID and Flu PCR. Followup with Family Doctor if not improved in about 8 days. With his recent history of back sprain, recommend he follow-up with PCP if back pain not improved in two weeks.  Final Clinical Impressions(s) /  UC Diagnoses   Final diagnoses:  Viral URI     Discharge Instructions      Take plain guaifenesin ('1200mg'$  extended release tabs such as Mucinex) twice daily, with plenty of water, for cough and congestion. Get adequate rest.   For increasing nasal congestion may use Afrin nasal spray (or generic oxymetazoline) each morning for about 5 days and then discontinue.  Also recommend using saline nasal spray several times daily and saline nasal irrigation (AYR is a common brand).  Use Flonase nasal spray each morning after using Afrin nasal spray and saline nasal irrigation. Try warm salt water gargles if sore throat develops. Stop all antihistamines for now, and other non-prescription cough/cold preparations. May take Delsym Cough Suppressant ("12 Hour Cough Relief") at bedtime for nighttime cough.  May take Ibuprofen '200mg'$ , 4 tabs every 8 hours with food for body aches, fever, and lower back pain.   If your COVID-19 test is positive, isolate yourself for five days from the time of your symptom onset.  At the end of five days you may end isolation if your symptoms have cleared or improved, and you have not had a fever for 24 hours. At this time you should wear a mask for five more days when you are around others.   If symptoms become significantly worse during the night or over the weekend, proceed to the local emergency room.         ED Prescriptions   None       Kandra Nicolas, MD 03/07/22 1441

## 2022-03-07 ENCOUNTER — Telehealth: Payer: Self-pay

## 2022-03-07 NOTE — Telephone Encounter (Signed)
TCT pt to follow up from recent. Pt denies any needs at this current time.

## 2022-03-08 ENCOUNTER — Ambulatory Visit: Payer: BC Managed Care – PPO | Admitting: Family Medicine

## 2022-03-08 ENCOUNTER — Telehealth: Payer: Self-pay

## 2022-03-08 ENCOUNTER — Encounter: Payer: Self-pay | Admitting: Family Medicine

## 2022-03-08 ENCOUNTER — Telehealth: Payer: Self-pay | Admitting: Emergency Medicine

## 2022-03-08 VITALS — BP 150/98 | HR 92 | Temp 98.7°F | Resp 20 | Ht 71.0 in | Wt 288.0 lb

## 2022-03-08 DIAGNOSIS — R051 Acute cough: Secondary | ICD-10-CM

## 2022-03-08 DIAGNOSIS — R531 Weakness: Secondary | ICD-10-CM | POA: Diagnosis not present

## 2022-03-08 DIAGNOSIS — I10 Essential (primary) hypertension: Secondary | ICD-10-CM

## 2022-03-08 DIAGNOSIS — F419 Anxiety disorder, unspecified: Secondary | ICD-10-CM

## 2022-03-08 DIAGNOSIS — J324 Chronic pansinusitis: Secondary | ICD-10-CM | POA: Diagnosis not present

## 2022-03-08 LAB — COMPREHENSIVE METABOLIC PANEL WITH GFR
ALT: 28 U/L (ref 0–53)
AST: 25 U/L (ref 0–37)
Albumin: 4.5 g/dL (ref 3.5–5.2)
Alkaline Phosphatase: 72 U/L (ref 39–117)
BUN: 18 mg/dL (ref 6–23)
CO2: 36 meq/L — ABNORMAL HIGH (ref 19–32)
Calcium: 9.2 mg/dL (ref 8.4–10.5)
Chloride: 91 meq/L — ABNORMAL LOW (ref 96–112)
Creatinine, Ser: 1.13 mg/dL (ref 0.40–1.50)
GFR: 81.53 mL/min
Glucose, Bld: 81 mg/dL (ref 70–99)
Potassium: 3.4 meq/L — ABNORMAL LOW (ref 3.5–5.1)
Sodium: 137 meq/L (ref 135–145)
Total Bilirubin: 0.9 mg/dL (ref 0.2–1.2)
Total Protein: 7.6 g/dL (ref 6.0–8.3)

## 2022-03-08 LAB — CORTISOL: Cortisol, Plasma: 7.3 ug/dL

## 2022-03-08 LAB — CBC WITH DIFFERENTIAL/PLATELET
Basophils Absolute: 0 10*3/uL (ref 0.0–0.1)
Basophils Relative: 0.3 % (ref 0.0–3.0)
Eosinophils Absolute: 0.1 10*3/uL (ref 0.0–0.7)
Eosinophils Relative: 0.6 % (ref 0.0–5.0)
HCT: 45.9 % (ref 39.0–52.0)
Hemoglobin: 16 g/dL (ref 13.0–17.0)
Lymphocytes Relative: 23.2 % (ref 12.0–46.0)
Lymphs Abs: 2.4 10*3/uL (ref 0.7–4.0)
MCHC: 34.9 g/dL (ref 30.0–36.0)
MCV: 87.7 fl (ref 78.0–100.0)
Monocytes Absolute: 1.4 10*3/uL — ABNORMAL HIGH (ref 0.1–1.0)
Monocytes Relative: 14.2 % — ABNORMAL HIGH (ref 3.0–12.0)
Neutro Abs: 6.3 10*3/uL (ref 1.4–7.7)
Neutrophils Relative %: 61.7 % (ref 43.0–77.0)
Platelets: 307 10*3/uL (ref 150.0–400.0)
RBC: 5.23 Mil/uL (ref 4.22–5.81)
RDW: 12.9 % (ref 11.5–15.5)
WBC: 10.2 10*3/uL (ref 4.0–10.5)

## 2022-03-08 LAB — TSH: TSH: 0.99 u[IU]/mL (ref 0.35–5.50)

## 2022-03-08 LAB — VITAMIN B12: Vitamin B-12: 225 pg/mL (ref 211–911)

## 2022-03-08 MED ORDER — AMOXICILLIN-POT CLAVULANATE 875-125 MG PO TABS: 1.0000 | ORAL_TABLET | Freq: Two times a day (BID) | ORAL | 0 refills | Status: DC

## 2022-03-08 MED ORDER — METOPROLOL SUCCINATE ER 50 MG PO TB24: 50.0000 mg | ORAL_TABLET | Freq: Every day | ORAL | 3 refills | Status: DC

## 2022-03-08 MED ORDER — FLUTICASONE PROPIONATE 50 MCG/ACT NA SUSP: 2.0000 | Freq: Every day | NASAL | 6 refills | Status: DC

## 2022-03-08 NOTE — Progress Notes (Signed)
Subjective:   By signing my name below, I, Shehryar Baig, attest that this documentation has been prepared under the direction and in the presence of Dr. Roma Schanz, DO. 03/08/2022    Patient ID: Brendan Gregory, male    DOB: 1980-05-13, 41 y.o.   MRN: [id number]  Chief Complaint  Patient presents with   Hypertension    Pt states having headache, dizziness, flushed, lips were tingling, trouble concentration. No chest pain   Follow-up    Hypertension Associated symptoms include headaches and malaise/fatigue.   Patient is in today for a follow up visit.   He complains of headache, diarrhea, dehydration, dizziness, fatigue. His wife reports he is was slurring his words this morning. He has decreased appetite due to diarrhea and fatigue. While in the ED he had chest congestion with occasional cough to clear his chest. He has taken mucinex to manage his symptoms. He was then taken to the ED and tested negative for RSV, Flu, and Covid-19. He had a chest X-ray while in the ED yesterday and found no new issues. He also found his blood pressure is elevated while in the ED. His blood pressure is still not under control. He feels his blood rushing to his head when his blood pressure is elevated. He had this sensations for a while. He continues taking 25 mg chlorthalidone daily PO. He has tried 2 other blood pressure medications in the past but found he had too much anxiety while taking it which increased his blood pressure. He has not taken his 10 mg cyclobenzaprine for the past couple of days.  BP Readings from Last 3 Encounters:  03/08/22 (!) 150/98  03/06/22 136/88  11/16/21 138/82   Pulse Readings from Last 3 Encounters:  03/08/22 92  03/06/22 95  11/16/21 73   He is seeing a chiropractor to manage his back pain.  He also complains of ear fullness in his right ear but found no new issues while in the ED.  He is regularly exercising by walking 3-5 miles daily. He is maintaining a  healthy diet.  Wt Readings from Last 3 Encounters:  03/08/22 288 lb (130.6 kg)  03/06/22 286 lb (129.7 kg)  11/16/21 288 lb 8 oz (130.9 kg)    Past Medical History:  Diagnosis Date   Agatston coronary artery calcium score less than 100    Ca score 1.  No CAD on coronary CTA   Hypertension    Panic attacks     No past surgical history on file.  Family History  Problem Relation Age of Onset   Healthy Mother    Prostate cancer Father    Diabetes Father    Hypertension Father    Diabetes Brother    Hypertension Brother    Diabetes Maternal Grandfather     Social History   Socioeconomic History   Marital status: Married    Spouse name: Not on file   Number of children: Not on file   Years of education: Not on file   Highest education level: Not on file  Occupational History   Not on file  Tobacco Use   Smoking status: Never   Smokeless tobacco: Never  Vaping Use   Vaping Use: Former   Substances: THC  Substance and Sexual Activity   Alcohol use: Not Currently   Drug use: Not Currently    Frequency: 3.0 times per week    Types: Marijuana    Comment: quit 3 weeks ago   Sexual activity:  Not on file  Other Topics Concern   Not on file  Social History Narrative   Not on file   Social Determinants of Health   Financial Resource Strain: Not on file  Food Insecurity: Not on file  Transportation Needs: Not on file  Physical Activity: Not on file  Stress: Not on file  Social Connections: Not on file  Intimate Partner Violence: Not on file    Outpatient Medications Prior to Visit  Medication Sig Dispense Refill   atorvastatin (LIPITOR) 10 MG tablet Take 1 tablet (10 mg total) by mouth daily. 30 tablet 0   chlorthalidone (HYGROTON) 25 MG tablet TAKE 1 TABLET (25 MG TOTAL) BY MOUTH DAILY. 90 tablet 0   Cholecalciferol (VITAMIN D3 PO) Take 5,000 Units by mouth daily.     cyclobenzaprine (FLEXERIL) 10 MG tablet Take 1 tablet (10 mg total) by mouth 3 (three) times  daily as needed for muscle spasms. 30 tablet 0   folic acid (FOLVITE) 008 MCG tablet Take 400 mcg by mouth daily.     magnesium gluconate (MAGONATE) 500 MG tablet Take 500 mg by mouth daily.     Omega-3 Fatty Acids (FISH OIL) 1000 MG CAPS Take by mouth daily.     OVER THE COUNTER MEDICATION Take 1 capsule by mouth daily at 6 (six) AM. Aged beet root     FLUoxetine (PROZAC) 20 MG tablet Take 1 tablet (20 mg total) by mouth daily. 30 tablet 0   No facility-administered medications prior to visit.    No Known Allergies  Review of Systems  Constitutional:  Positive for malaise/fatigue.  HENT:         (+)ear fullness in right ear  Respiratory:  Positive for cough.        (+)chest congestion  Gastrointestinal:  Positive for diarrhea.       (+)dehydration  Neurological:  Positive for dizziness, speech change (slurring words) and headaches.       Objective:    Physical Exam Constitutional:      General: He is not in acute distress.    Appearance: Normal appearance. He is not ill-appearing.  HENT:     Head: Normocephalic and atraumatic.     Right Ear: External ear normal.     Left Ear: External ear normal.  Eyes:     Extraocular Movements: Extraocular movements intact.     Pupils: Pupils are equal, round, and reactive to light.  Cardiovascular:     Rate and Rhythm: Normal rate and regular rhythm.     Heart sounds: Normal heart sounds. No murmur heard.    No gallop.  Pulmonary:     Effort: Pulmonary effort is normal. No respiratory distress.     Breath sounds: Normal breath sounds. No wheezing or rales.  Musculoskeletal:     Comments: 5/5 strength in both upper and lower extremities  Skin:    General: Skin is warm and dry.  Neurological:     Mental Status: He is alert and oriented to person, place, and time.  Psychiatric:        Judgment: Judgment normal.     BP (!) 150/98 (BP Location: Left Arm, Patient Position: Sitting, Cuff Size: Large)   Pulse 92   Temp 98.7 F  (37.1 C) (Oral)   Resp 20   Ht _0  (1.803 m)   Wt 288 lb (130.6 kg)   SpO2 98%   BMI 40.17 kg/m  Wt Readings from Last 3 Encounters:  03/08/22 288 lb (  130.6 kg)  03/06/22 286 lb (129.7 kg)  11/16/21 288 lb 8 oz (130.9 kg)    Diabetic Foot Exam - Simple   No data filed    Lab Results  Component Value Date   WBC 10.0 11/01/2021   HGB 15.6 11/01/2021   HCT 45.2 11/01/2021   PLT 305.0 11/01/2021   GLUCOSE 97 11/01/2021   CHOL 112 04/30/2021   TRIG 105 04/30/2021   HDL 35 (L) 04/30/2021   LDLCALC 57 04/30/2021   ALT 47 11/01/2021   AST 37 11/01/2021   NA 135 11/01/2021   K 3.6 11/01/2021   CL 91 (L) 11/01/2021   CREATININE 1.12 11/01/2021   BUN 17 11/01/2021   CO2 31 11/01/2021   TSH 0.70 11/01/2021   PSA 0.29 11/01/2021   HGBA1C 5.6 11/01/2021    Lab Results  Component Value Date   TSH 0.70 11/01/2021   Lab Results  Component Value Date   WBC 10.0 11/01/2021   HGB 15.6 11/01/2021   HCT 45.2 11/01/2021   MCV 89.1 11/01/2021   PLT 305.0 11/01/2021   Lab Results  Component Value Date   NA 135 11/01/2021   K 3.6 11/01/2021   CO2 31 11/01/2021   GLUCOSE 97 11/01/2021   BUN 17 11/01/2021   CREATININE 1.12 11/01/2021   BILITOT 0.9 11/01/2021   ALKPHOS 71 11/01/2021   AST 37 11/01/2021   ALT 47 11/01/2021   PROT 8.6 (H) 11/01/2021   ALBUMIN 5.1 11/01/2021   CALCIUM 10.4 11/01/2021   EGFR 110 02/10/2021   GFR 82.61 11/01/2021   Lab Results  Component Value Date   CHOL 112 04/30/2021   Lab Results  Component Value Date   HDL 35 (L) 04/30/2021   Lab Results  Component Value Date   LDLCALC 57 04/30/2021   Lab Results  Component Value Date   TRIG 105 04/30/2021   Lab Results  Component Value Date   CHOLHDL 3.2 04/30/2021   Lab Results  Component Value Date   HGBA1C 5.6 11/01/2021       Assessment & Plan:   Problem List Items Addressed This Visit       Unprioritized   Weakness    Recheck cmp/ cbc  NA and K low in er        Relevant Orders   Cortisol   Pansinusitis    Abx per orders  Flonase and antihistamine        Relevant Medications   amoxicillin-clavulanate (AUGMENTIN) 875-125 MG tablet   fluticasone (FLONASE) 50 MCG/ACT nasal spray   Essential hypertension    Poorly controlled will alter medications, encouraged DASH diet, minimize caffeine and obtain adequate sleep. Report concerning symptoms and follow up as directed and as needed  Add toprol and f/u pcp 2-3 weeks       Relevant Medications   metoprolol succinate (TOPROL-XL) 50 MG 24 hr tablet   Acute cough   Relevant Orders   DG Chest 2 View   Other Visit Diagnoses     Primary hypertension    -  Primary   Relevant Medications   metoprolol succinate (TOPROL-XL) 50 MG 24 hr tablet   Other Relevant Orders   CBC with Differential/Platelet   Comprehensive metabolic panel   Vitamin Q68   TSH   Anxiety       Relevant Medications   metoprolol succinate (TOPROL-XL) 50 MG 24 hr tablet   Other Relevant Orders   CBC with Differential/Platelet   Comprehensive metabolic panel  Vitamin B12   TSH        Meds ordered this encounter  Medications   metoprolol succinate (TOPROL-XL) 50 MG 24 hr tablet    Sig: Take 1 tablet (50 mg total) by mouth daily. Take with or immediately following a meal.    Dispense:  90 tablet    Refill:  3   amoxicillin-clavulanate (AUGMENTIN) 875-125 MG tablet    Sig: Take 1 tablet by mouth 2 (two) times daily.    Dispense:  20 tablet    Refill:  0   fluticasone (FLONASE) 50 MCG/ACT nasal spray    Sig: Place 2 sprays into both nostrils daily.    Dispense:  16 g    Refill:  6    I, Ann Held, DO, personally preformed the services described in this documentation.  All medical record entries made by the scribe were at my direction and in my presence.  I have reviewed the chart and discharge instructions (if applicable) and agree that the record reflects my personal performance and is accurate and  complete. 03/08/2022   I,Shehryar Baig,acting as a scribe for Ann Held, DO.,have documented all relevant documentation on the behalf of Ann Held, DO,as directed by  Ann Held, DO while in the presence of Ann Held, DO.   Ann Held, DO

## 2022-03-08 NOTE — Assessment & Plan Note (Signed)
Recheck cmp/ cbc  NA and K low in er

## 2022-03-08 NOTE — Telephone Encounter (Signed)
OK w me.  

## 2022-03-08 NOTE — Telephone Encounter (Signed)
Patient needs clarification on his blood pressure medication. He wanted to be sure that he should not take them both together. He would like to know if he should start the new blood pressure tomorrow and discontinue the old blood pressure medicine. Please call to advise.

## 2022-03-08 NOTE — Telephone Encounter (Signed)
I am okay with this transfer of care.  Thanks.

## 2022-03-08 NOTE — Patient Instructions (Signed)

## 2022-03-08 NOTE — Assessment & Plan Note (Signed)
Abx per orders  Flonase and antihistamine

## 2022-03-08 NOTE — Telephone Encounter (Signed)
Patient called in very concerned about his BP. He checked his BP at home with a digital arm cuff and the reading was 175/103 with HR over 100. Patient has been seen in the ED and Urgent Care over the past two days. Tested negative for COVID, RSV, and FLU. Continues to have s/s of shakiness, clamminess, dizziness, weakness, cold sweats. Patient wanted to take another BP pill but this nurse advised pt to wait until he was able to discuss this with the doctor at this visit this morning at 10:40. This nurse encouraged fluids in the meantime.

## 2022-03-08 NOTE — Telephone Encounter (Signed)
Patient would like to transfer care from [facility] to [place] in [place], since he just recently moved close to this area. Is this ok?

## 2022-03-08 NOTE — Telephone Encounter (Signed)
Please advise. I didn't see in the note if he is continuing with both or just the one?

## 2022-03-08 NOTE — Assessment & Plan Note (Signed)
Poorly controlled will alter medications, encouraged DASH diet, minimize caffeine and obtain adequate sleep. Report concerning symptoms and follow up as directed and as needed  Add toprol and f/u pcp 2-3 weeks

## 2022-03-09 ENCOUNTER — Ambulatory Visit: Payer: BC Managed Care – PPO | Admitting: Emergency Medicine

## 2022-03-09 NOTE — Telephone Encounter (Signed)
Patient notified

## 2022-03-11 ENCOUNTER — Telehealth: Payer: Self-pay | Admitting: Family Medicine

## 2022-03-11 ENCOUNTER — Telehealth: Payer: BC Managed Care – PPO | Admitting: Physician Assistant

## 2022-03-11 DIAGNOSIS — I10 Essential (primary) hypertension: Secondary | ICD-10-CM | POA: Diagnosis not present

## 2022-03-11 MED ORDER — AMLODIPINE BESYLATE 5 MG PO TABS: 5.0000 mg | ORAL_TABLET | Freq: Every day | ORAL | 0 refills | Status: DC

## 2022-03-11 MED ORDER — KETOROLAC TROMETHAMINE 10 MG PO TABS: 10.0000 mg | ORAL_TABLET | Freq: Four times a day (QID) | ORAL | 0 refills | Status: DC | PRN

## 2022-03-11 NOTE — Telephone Encounter (Signed)
Called the patient informed of PCP response. He is doing video call with UC currently. Will see PCP on Monday.

## 2022-03-11 NOTE — Telephone Encounter (Signed)
Tylenol. Excedrin every once in a while is fine too.

## 2022-03-11 NOTE — Progress Notes (Signed)
Virtual Visit Consent   Brendan Gregory, you are scheduled for a virtual visit with a [place] provider today. Just as with appointments in the office, your consent must be obtained to participate. Your consent will be active for this visit and any virtual visit you may have with one of our providers in the next 365 days. If you have a MyChart account, a copy of this consent can be sent to you electronically.  As this is a virtual visit, video technology does not allow for your provider to perform a traditional examination. This may limit your provider's ability to fully assess your condition. If your provider identifies any concerns that need to be evaluated in person or the need to arrange testing (such as labs, EKG, etc.), we will make arrangements to do so. Although advances in technology are sophisticated, we cannot ensure that it will always work on either your end or our end. If the connection with a video visit is poor, the visit may have to be switched to a telephone visit. With either a video or telephone visit, we are not always able to ensure that we have a secure connection.  By engaging in this virtual visit, you consent to the provision of healthcare and authorize for your insurance to be billed (if applicable) for the services provided during this visit. Depending on your insurance coverage, you may receive a charge related to this service.  I need to obtain your verbal consent now. Are you willing to proceed with your visit today? Brendan Gregory has provided verbal consent on 03/11/2022 for a virtual visit (video or telephone). [name], [place]  Date: 03/11/2022 5:21 PM  Virtual Visit via Video Note   I, [name], connected with  Brendan Gregory  ([id number], [birth year]) on 03/11/22 at  4:30 PM EST by a video-enabled telemedicine application and verified that I am speaking with the correct person using two identifiers.  Location: Patient: Virtual Visit Location  Patient: Home Provider: Virtual Visit Location Provider: Home Office   I discussed the limitations of evaluation and management by telemedicine and the availability of in person appointments. The patient expressed understanding and agreed to proceed.    History of Present Illness: Brendan Gregory is a 41 y.o. who identifies as a male who was assigned male at birth, and is being seen today for continued headache and elevated blood pressure despite change in antihypertensive medications by his new primary care office.  Patient recently had metoprolol XL 50 mg added to his daily regimen of chlorthalidone 25 mg due to recent elevations in blood pressure.  Patient also treated for acute bacterial sinusitis at that time, being started on Augmentin.  Patient endorses taking all medications as directed.  Since starting his blood pressure medicine he has noted increase in fatigue, mental fog and worsening headaches.  Is trying to check his blood pressure at home but does not feel his blood pressure cuff is the most accurate.  He is continuing to get readings sometimes [id number] systolic.  Denies chest pain, shortness of breath, lightheadedness.  Recent labs with PCP office including CBC, CMP, cortisol, TSH and vitamin levels overall unremarkable except for borderline low potassium at 3.4.  Was previously on losartan and then valsartan in addition to the chlorthalidone for his blood pressure, but noted an issue with mood and anxiety while on this medicine.  As such this was stopped by his former PCP and he was left on the chlorthalidone alone.  Has been working  hard to lose weight but since recent move this has been harder.  Notes this issue with blood pressure has been present since he was diagnosed with a viral upper respiratory infection this past month.  Try to get in contact with PCP office this afternoon.  Does have an appointment scheduled for Monday but feels he cannot wait that long to talk to someone regarding  something for his headache.  Notes he is scared to just stop his new blood pressure medicine without speaking to someone.  HPI: HPI  Problems:  Patient Active Problem List   Diagnosis Date Noted   Acute cough 03/08/2022   Pansinusitis 03/08/2022   Weakness 03/08/2022   Panic attack 10/25/2021   Generalized anxiety disorder 10/25/2021   Solitary pulmonary nodule 05/04/2021   Agatston coronary artery calcium score less than 100 01/25/2021   Morbid obesity ([place]) 11/24/2020   Obstructive sleep apnea on CPAP 05/16/2019   Essential hypertension 05/16/2019    Allergies: No Known Allergies Medications:  Current Outpatient Medications:    amLODipine (NORVASC) 5 MG tablet, Take 1 tablet (5 mg total) by mouth daily., Disp: 30 tablet, Rfl: 0   ketorolac (TORADOL) 10 MG tablet, Take 1 tablet (10 mg total) by mouth every 6 (six) hours as needed., Disp: 20 tablet, Rfl: 0   amoxicillin-clavulanate (AUGMENTIN) 875-125 MG tablet, Take 1 tablet by mouth 2 (two) times daily., Disp: 20 tablet, Rfl: 0   atorvastatin (LIPITOR) 10 MG tablet, Take 1 tablet (10 mg total) by mouth daily., Disp: 30 tablet, Rfl: 0   chlorthalidone (HYGROTON) 25 MG tablet, TAKE 1 TABLET (25 MG TOTAL) BY MOUTH DAILY., Disp: 90 tablet, Rfl: 0   Cholecalciferol (VITAMIN D3 PO), Take 5,000 Units by mouth daily., Disp: , Rfl:    cyclobenzaprine (FLEXERIL) 10 MG tablet, Take 1 tablet (10 mg total) by mouth 3 (three) times daily as needed for muscle spasms., Disp: 30 tablet, Rfl: 0   fluticasone (FLONASE) 50 MCG/ACT nasal spray, Place 2 sprays into both nostrils daily., Disp: 16 g, Rfl: 6   folic acid (FOLVITE) 102 MCG tablet, Take 400 mcg by mouth daily., Disp: , Rfl:    magnesium gluconate (MAGONATE) 500 MG tablet, Take 500 mg by mouth daily., Disp: , Rfl:    Omega-3 Fatty Acids (FISH OIL) 1000 MG CAPS, Take by mouth daily., Disp: , Rfl:    OVER THE COUNTER MEDICATION, Take 1 capsule by mouth daily at 6 (six) AM. Aged beet root, Disp: ,  Rfl:   Observations/Objective: Patient is well-developed, well-nourished in no acute distress.  Resting comfortably at home.  Head is normocephalic, atraumatic.  No labored breathing. Speech is clear and coherent with logical content.  Patient is alert and oriented at baseline.   Assessment and Plan: 1. Hypertension, unspecified type - amLODipine (NORVASC) 5 MG tablet; Take 1 tablet (5 mg total) by mouth daily.  Dispense: 30 tablet; Refill: 0 - ketorolac (TORADOL) 10 MG tablet; Take 1 tablet (10 mg total) by mouth every 6 (six) hours as needed.  Dispense: 20 tablet; Refill: 0  Home check today with systolic greater than 725.  Continue daily headaches.  Discussed this could possibly be a combination of things including headache from blood pressure, potential ADR of metoprolol but hard to tell, and current sinusitis.  He is to continue current treatment for sinusitis.  Since the metoprolol has not helped with blood pressure and is causing substantial fatigue and mental fogginess, we will stop this.  Will switch to amlodipine  5 mg once daily.  Continue chlorthalidone.-Diet reviewed.  Will give very short course of oral ketorolac to help alleviate headache symptoms while we are getting blood pressure under better control.  He is to follow-up with his PCP on Monday as scheduled.  Discussed this is very important.  Strict ER precautions reviewed with patient who voiced understanding and agreement.  Follow Up Instructions: I discussed the assessment and treatment plan with the patient. The patient was provided an opportunity to ask questions and all were answered. The patient agreed with the plan and demonstrated an understanding of the instructions.  A copy of instructions were sent to the patient via MyChart unless otherwise noted below.   The patient was advised to call back or seek an in-person evaluation if the symptoms worsen or if the condition fails to improve as anticipated.  Time:  I spent  10 minutes with the patient via telehealth technology discussing the above problems/concerns.    [name], PA-C

## 2022-03-11 NOTE — Patient Instructions (Signed)
[name], thank you for joining [name], PA-C for today's virtual visit.  While this provider is not your primary care provider (PCP), if your PCP is located in our provider database this encounter information will be shared with them immediately following your visit.   [facility] account gives you access to today's visit and all your visits, tests, and labs performed at [facility] " click here if you don't have a [facility] account or go to [email or web address]  Consent: (Patient) Brendan Gregory provided verbal consent for this virtual visit at the beginning of the encounter.  Current Medications:  Current Outpatient Medications:    amLODipine (NORVASC) 5 MG tablet, Take 1 tablet (5 mg total) by mouth daily., Disp: 30 tablet, Rfl: 0   ketorolac (TORADOL) 10 MG tablet, Take 1 tablet (10 mg total) by mouth every 6 (six) hours as needed., Disp: 20 tablet, Rfl: 0   amoxicillin-clavulanate (AUGMENTIN) 875-125 MG tablet, Take 1 tablet by mouth 2 (two) times daily., Disp: 20 tablet, Rfl: 0   atorvastatin (LIPITOR) 10 MG tablet, Take 1 tablet (10 mg total) by mouth daily., Disp: 30 tablet, Rfl: 0   chlorthalidone (HYGROTON) 25 MG tablet, TAKE 1 TABLET (25 MG TOTAL) BY MOUTH DAILY., Disp: 90 tablet, Rfl: 0   Cholecalciferol (VITAMIN D3 PO), Take 5,000 Units by mouth daily., Disp: , Rfl:    cyclobenzaprine (FLEXERIL) 10 MG tablet, Take 1 tablet (10 mg total) by mouth 3 (three) times daily as needed for muscle spasms., Disp: 30 tablet, Rfl: 0   fluticasone (FLONASE) 50 MCG/ACT nasal spray, Place 2 sprays into both nostrils daily., Disp: 16 g, Rfl: 6   folic acid (FOLVITE) 026 MCG tablet, Take 400 mcg by mouth daily., Disp: , Rfl:    magnesium gluconate (MAGONATE) 500 MG tablet, Take 500 mg by mouth daily., Disp: , Rfl:    Omega-3 Fatty Acids (FISH OIL) 1000 MG CAPS, Take by mouth daily., Disp: , Rfl:    OVER THE COUNTER MEDICATION, Take 1 capsule by  mouth daily at 6 (six) AM. Aged beet root, Disp: , Rfl:    Medications ordered in this encounter:  Meds ordered this encounter  Medications   amLODipine (NORVASC) 5 MG tablet    Sig: Take 1 tablet (5 mg total) by mouth daily.    Dispense:  30 tablet    Refill:  0    Order Specific Question:   Supervising Provider    Answer:   Chase Picket [id number]   ketorolac (TORADOL) 10 MG tablet    Sig: Take 1 tablet (10 mg total) by mouth every 6 (six) hours as needed.    Dispense:  20 tablet    Refill:  0    Order Specific Question:   Supervising Provider    Answer:   Chase Picket [id number]     *If you need refills on other medications prior to your next appointment, please contact your pharmacy*  Follow-Up: Call back or seek an in-person evaluation if the symptoms worsen or if the condition fails to improve as anticipated.  [facility] [phone]  Other Instructions Stop the metoprolol.  Continue your chlorthalidone.  Start the amlodipine as directed. Increase fluids but limit salt intake. Follow the dietary recommendations below. You can use Tylenol OTC if needed.  Use the Toradol no more than as directed, if needed for acute headache. Get a new blood pressure cuff and check blood pressure daily and record.  Take to follow-up visits with your primary care provider. Follow-up with your new provider as scheduled on Monday. If anything worsens over the weekend or you note any associated chest pain, shortness of breath, racing heart, dizziness --you need an ER evaluation.  Do not delay care.  DASH Eating Plan DASH stands for Dietary Approaches to Stop Hypertension. The DASH eating plan is a healthy eating plan that has been shown to: Reduce high blood pressure (hypertension). Reduce your risk for type 2 diabetes, heart disease, and stroke. Help with weight loss. What are tips for following this plan? Reading food labels Check food labels for the amount of salt  (sodium) per serving. Choose foods with less than 5 percent of the Daily Value of sodium. Generally, foods with less than 300 milligrams (mg) of sodium per serving fit into this eating plan. To find whole grains, look for the word "whole" as the first word in the ingredient list. Shopping Buy products labeled as "low-sodium" or "no salt added." Buy fresh foods. Avoid canned foods and pre-made or frozen meals. Cooking Avoid adding salt when cooking. Use salt-free seasonings or herbs instead of table salt or sea salt. Check with your health care provider or pharmacist before using salt substitutes. Do not fry foods. Cook foods using healthy methods such as baking, boiling, grilling, roasting, and broiling instead. Cook with heart-healthy oils, such as olive, canola, avocado, soybean, or sunflower oil. Meal planning  Eat a balanced diet that includes: 4 or more servings of fruits and 4 or more servings of vegetables each day. Try to fill one-half of your plate with fruits and vegetables. 6-8 servings of whole grains each day. Less than 6 oz (170 g) of lean meat, poultry, or fish each day. A 3-oz (85-g) serving of meat is about the same size as a deck of cards. One egg equals 1 oz (28 g). 2-3 servings of low-fat dairy each day. One serving is 1 cup (237 mL). 1 serving of nuts, seeds, or beans 5 times each week. 2-3 servings of heart-healthy fats. Healthy fats called omega-3 fatty acids are found in foods such as walnuts, flaxseeds, fortified milks, and eggs. These fats are also found in cold-water fish, such as sardines, salmon, and mackerel. Limit how much you eat of: Canned or prepackaged foods. Food that is high in trans fat, such as some fried foods. Food that is high in saturated fat, such as fatty meat. Desserts and other sweets, sugary drinks, and other foods with added sugar. Full-fat dairy products. Do not salt foods before eating. Do not eat more than 4 egg yolks a week. Try to eat at  least 2 vegetarian meals a week. Eat more home-cooked food and less restaurant, buffet, and fast food. Lifestyle When eating at a restaurant, ask that your food be prepared with less salt or no salt, if possible. If you drink alcohol: Limit how much you use to: 0-1 drink a day for women who are not pregnant. 0-2 drinks a day for men. Be aware of how much alcohol is in your drink. In the [place] one drink equals one 12 oz bottle of beer (355 mL), one 5 oz glass of wine (148 mL), or one 1 oz glass of hard liquor (44 mL). General information Avoid eating more than 2,300 mg of salt a day. If you have hypertension, you may need to reduce your sodium intake to 1,500 mg a day. Work with your health care provider to maintain a healthy body weight  or to lose weight. Ask what an ideal weight is for you. Get at least 30 minutes of exercise that causes your heart to beat faster (aerobic exercise) most days of the week. Activities may include walking, swimming, or biking. Work with your health care provider or dietitian to adjust your eating plan to your individual calorie needs. What foods should I eat? Fruits All fresh, dried, or frozen fruit. Canned fruit in natural juice (without added sugar). Vegetables Fresh or frozen vegetables (raw, steamed, roasted, or grilled). Low-sodium or reduced-sodium tomato and vegetable juice. Low-sodium or reduced-sodium tomato sauce and tomato paste. Low-sodium or reduced-sodium canned vegetables. Grains Whole-grain or whole-wheat bread. Whole-grain or whole-wheat pasta. Brown rice. [place]. Bulgur. Whole-grain and low-sodium cereals. Pita bread. Low-fat, low-sodium crackers. Whole-wheat flour tortillas. Meats and other proteins Skinless chicken or [place]. Ground chicken or [place]. Pork with fat trimmed off. Fish and seafood. Egg whites. Dried beans, peas, or lentils. Unsalted nuts, nut butters, and seeds. Unsalted canned beans. Lean cuts of beef with fat trimmed  off. Low-sodium, lean precooked or cured meat, such as sausages or meat loaves. Dairy Low-fat (1%) or fat-free (skim) milk. Reduced-fat, low-fat, or fat-free cheeses. Nonfat, low-sodium ricotta or cottage cheese. Low-fat or nonfat yogurt. Low-fat, low-sodium cheese. Fats and oils Soft margarine without trans fats. Vegetable oil. Reduced-fat, low-fat, or light mayonnaise and salad dressings (reduced-sodium). Canola, safflower, olive, avocado, soybean, and sunflower oils. Avocado. Seasonings and condiments Herbs. Spices. Seasoning mixes without salt. Other foods Unsalted popcorn and pretzels. Fat-free sweets. The items listed above may not be a complete list of foods and beverages you can eat. Contact a dietitian for more information. What foods should I avoid? Fruits Canned fruit in a light or heavy syrup. Fried fruit. Fruit in cream or butter sauce. Vegetables Creamed or fried vegetables. Vegetables in a cheese sauce. Regular canned vegetables (not low-sodium or reduced-sodium). Regular canned tomato sauce and paste (not low-sodium or reduced-sodium). Regular tomato and vegetable juice (not low-sodium or reduced-sodium). Angie Fava. Olives. Grains Baked goods made with fat, such as croissants, muffins, or some breads. Dry pasta or rice meal packs. Meats and other proteins Fatty cuts of meat. Ribs. Fried meat. Berniece Salines. Bologna, salami, and other precooked or cured meats, such as sausages or meat loaves. Fat from the back of a pig (fatback). Bratwurst. Salted nuts and seeds. Canned beans with added salt. Canned or smoked fish. Whole eggs or egg yolks. Chicken or [place] with skin. Dairy Whole or 2% milk, cream, and half-and-half. Whole or full-fat cream cheese. Whole-fat or sweetened yogurt. Full-fat cheese. Nondairy creamers. Whipped toppings. Processed cheese and cheese spreads. Fats and oils Butter. Stick margarine. Lard. Shortening. Ghee. Bacon fat. Tropical oils, such as coconut, palm kernel, or  palm oil. Seasonings and condiments Onion salt, garlic salt, seasoned salt, table salt, and sea salt. Worcestershire sauce. Tartar sauce. Barbecue sauce. Teriyaki sauce. Soy sauce, including reduced-sodium. Steak sauce. Canned and packaged gravies. Fish sauce. Oyster sauce. Cocktail sauce. Store-bought horseradish. Ketchup. Mustard. Meat flavorings and tenderizers. Bouillon cubes. Hot sauces. Pre-made or packaged marinades. Pre-made or packaged taco seasonings. Relishes. Regular salad dressings. Other foods Salted popcorn and pretzels. The items listed above may not be a complete list of foods and beverages you should avoid. Contact a dietitian for more information. Where to find more information National Heart, Lung, and Blood Institute: https://wilson-eaton.com/ American Heart Association: www.heart.org Academy of Nutrition and Dietetics: www.eatright.[place]: www.kidney.org Summary The DASH eating plan is a healthy eating plan that  has been shown to reduce high blood pressure (hypertension). It may also reduce your risk for type 2 diabetes, heart disease, and stroke. When on the DASH eating plan, aim to eat more fresh fruits and vegetables, whole grains, lean proteins, low-fat dairy, and heart-healthy fats. With the DASH eating plan, you should limit salt (sodium) intake to 2,300 mg a day. If you have hypertension, you may need to reduce your sodium intake to 1,500 mg a day. Work with your health care provider or dietitian to adjust your eating plan to your individual calorie needs. This information is not intended to replace advice given to you by your health care provider. Make sure you discuss any questions you have with your health care provider. Document Revised: 02/22/2019 Document Reviewed: 02/22/2019 Elsevier Patient Education  [place].    If you have been instructed to have an in-person evaluation today at a local Urgent Care facility, please use the link  below. It will take you to a list of all of our available [facility], including address, phone number and hours of operation. Please do not delay care.  [facility]  If you or a family member do not have a primary care provider, use the link below to schedule a visit and establish care. When you choose a [facility] primary care physician or advanced practice provider, you gain a long-term partner in health. Find a Primary Care Provider  Learn more about [place]'s in-office and virtual care options: [facility]

## 2022-03-11 NOTE — Telephone Encounter (Signed)
Patient called complaining of a headache. Patient saw Dr. Etter Sjogren for his blood pressure and he said that he is still having an ongoing headache and it is causing him to not function. He said he knows he is limited on what he can take due to the new blood pressure medicine and wants some guidance on what he can take to help relieve headache. Patient has appt on Monday but cannot take the headache that long. Please call patient to advise what he can do about headache.

## 2022-03-14 ENCOUNTER — Ambulatory Visit: Payer: BC Managed Care – PPO | Admitting: Family Medicine

## 2022-03-14 ENCOUNTER — Other Ambulatory Visit: Payer: Self-pay | Admitting: Family Medicine

## 2022-03-14 ENCOUNTER — Encounter: Payer: Self-pay | Admitting: Family Medicine

## 2022-03-14 VITALS — BP 142/92 | HR 104 | Temp 98.4°F | Ht 71.0 in | Wt 284.2 lb

## 2022-03-14 DIAGNOSIS — E876 Hypokalemia: Secondary | ICD-10-CM

## 2022-03-14 DIAGNOSIS — I10 Essential (primary) hypertension: Secondary | ICD-10-CM

## 2022-03-14 LAB — BASIC METABOLIC PANEL
BUN: 16 mg/dL (ref 6–23)
CO2: 33 mEq/L — ABNORMAL HIGH (ref 19–32)
Calcium: 9.6 mg/dL (ref 8.4–10.5)
Chloride: 93 mEq/L — ABNORMAL LOW (ref 96–112)
Creatinine, Ser: 1.16 mg/dL (ref 0.40–1.50)
GFR: 79 mL/min (ref >60.00)
Glucose, Bld: 101 mg/dL — ABNORMAL HIGH (ref 70–99)
Potassium: 3.1 mEq/L — ABNORMAL LOW (ref 3.5–5.1)
Sodium: 137 mEq/L (ref 135–145)

## 2022-03-14 MED ORDER — OLMESARTAN MEDOXOMIL 20 MG PO TABS: 20.0000 mg | ORAL_TABLET | Freq: Every day | ORAL | 2 refills | Status: DC

## 2022-03-14 MED ORDER — LISINOPRIL 20 MG PO TABS: 20.0000 mg | ORAL_TABLET | Freq: Every day | ORAL | 3 refills | Status: DC

## 2022-03-14 MED ORDER — POTASSIUM CHLORIDE ER 10 MEQ PO TBCR: 20.0000 meq | EXTENDED_RELEASE_TABLET | Freq: Every day | ORAL | 0 refills | Status: DC

## 2022-03-14 NOTE — Patient Instructions (Addendum)
Keep the diet clean and stay active.  Check your blood pressures 2-3 times per week, alternating the time of day you check it. If it is high, considering waiting 1-2 minutes and rechecking. If it gets higher, your anxiety is likely creeping up and we should avoid rechecking.   Give [place] 2-3 business days to get the results of your labs back.   Let us know if you need anything.

## 2022-03-14 NOTE — Progress Notes (Signed)
Chief Complaint  Patient presents with   Follow-up    Sinus headaches Blood pressure     Subjective Brendan Gregory is a 41 y.o. male who presents for hypertension follow up. He does not monitor home blood pressures. He is compliant with medication- Norvasc 5 mg/d, chlorthalidone 25 mg/d. Patient has these side effects of medication: none He is usually adhering to a healthy diet overall. Current exercise: walking No Cp or SOB.    Past Medical History:  Diagnosis Date   Agatston coronary artery calcium score less than 100    Ca score 1.  No CAD on coronary CTA   Hypertension    Panic attacks     Exam BP (!) 142/92 (BP Location: Left Arm, Patient Position: Sitting, Cuff Size: Large)   Pulse (!) 104   Temp 98.4 F (36.9 C) (Oral)   Ht '5\' 11"'$  (1.803 m)   Wt 284 lb 4 oz (128.9 kg)   SpO2 97%   BMI 39.64 kg/m  General:  well developed, well nourished, in no apparent distress HEENT: Ears patent, neg TM's, nares patent, no rhinorrhea, MMD, no pharyngeal exudate/erythema, minus max sinus ttp b/l Heart: RRR, no bruits, no LE edema Lungs: clear to auscultation, no accessory muscle use Psych: well oriented with normal range of affect and appropriate judgment/insight  Essential hypertension   Hypokalemia - Plan: Basic metabolic panel  Adverse effects of med (chlorthalidone). Will stop chlorthalidone. Start lisinopril 20 mg/d. Cont Norvasc 5 mg/d. Counseled on diet and exercise. F/u in 2 weeks as originally. The patient voiced understanding and agreement to the plan.  [place], DO 03/14/22  1:46 PM

## 2022-03-15 ENCOUNTER — Other Ambulatory Visit: Payer: Self-pay | Admitting: Family Medicine

## 2022-03-15 ENCOUNTER — Other Ambulatory Visit: Payer: Self-pay | Admitting: Cardiology

## 2022-03-15 DIAGNOSIS — E876 Hypokalemia: Secondary | ICD-10-CM

## 2022-03-22 ENCOUNTER — Other Ambulatory Visit (INDEPENDENT_AMBULATORY_CARE_PROVIDER_SITE_OTHER): Payer: BC Managed Care – PPO

## 2022-03-22 DIAGNOSIS — G4733 Obstructive sleep apnea (adult) (pediatric): Secondary | ICD-10-CM | POA: Diagnosis not present

## 2022-03-22 DIAGNOSIS — E876 Hypokalemia: Secondary | ICD-10-CM

## 2022-03-22 LAB — BASIC METABOLIC PANEL
BUN: 18 mg/dL (ref 6–23)
CO2: 30 mEq/L (ref 19–32)
Calcium: 8.8 mg/dL (ref 8.4–10.5)
Chloride: 101 mEq/L (ref 96–112)
Creatinine, Ser: 0.93 mg/dL (ref 0.40–1.50)
GFR: 102.97 mL/min (ref >60.00)
Glucose, Bld: 109 mg/dL — ABNORMAL HIGH (ref 70–99)
Potassium: 4.4 mEq/L (ref 3.5–5.1)
Sodium: 137 mEq/L (ref 135–145)

## 2022-03-23 ENCOUNTER — Other Ambulatory Visit: Payer: Self-pay | Admitting: Cardiology

## 2022-03-24 DIAGNOSIS — F411 Generalized anxiety disorder: Secondary | ICD-10-CM | POA: Diagnosis not present

## 2022-03-30 ENCOUNTER — Ambulatory Visit: Payer: BC Managed Care – PPO | Admitting: Family Medicine

## 2022-03-30 ENCOUNTER — Encounter: Payer: Self-pay | Admitting: Family Medicine

## 2022-03-30 VITALS — BP 130/65 | HR 82 | Temp 98.4°F | Resp 16 | Ht 71.0 in | Wt 292.0 lb

## 2022-03-30 DIAGNOSIS — J069 Acute upper respiratory infection, unspecified: Secondary | ICD-10-CM | POA: Diagnosis not present

## 2022-03-30 DIAGNOSIS — I10 Essential (primary) hypertension: Secondary | ICD-10-CM | POA: Diagnosis not present

## 2022-03-30 DIAGNOSIS — M67952 Unspecified disorder of synovium and tendon, left thigh: Secondary | ICD-10-CM

## 2022-03-30 DIAGNOSIS — M25531 Pain in right wrist: Secondary | ICD-10-CM | POA: Diagnosis not present

## 2022-03-30 NOTE — Patient Instructions (Addendum)
Continue to push fluids, practice good hand hygiene, and cover your mouth if you cough.  If you start having fevers, shaking or shortness of breath, seek immediate care.  Keep the diet clean and stay active.  OK to take Tylenol 1000 mg (2 extra strength tabs) or 975 mg (3 regular strength tabs) every 6 hours as needed.  Ibuprofen 400-600 mg (2-3 over the counter strength tabs) every 6 hours as needed for pain.  Consider throat lozenges, salt water gargles and an air humidifier for symptomatic care.   Let us know if you need anything.  Gluteus Medius Syndrome Rehab It is normal to feel mild stretching, pulling, tightness, or discomfort as you do these exercises, but you should stop right away if you feel sudden pain or your pain gets worse.   Stretching and range of motion exercise This exercise warms up your muscles and joints and improves the movement and flexibility of your hip and pelvis. This exercise also helps to relieve pain and stiffness. Exercise A: Lunge (hip flexor stretch)      Kneel on the floor on your left / right knee. Bend your other knee so it is directly over your ankle. Keep good posture with your head over your shoulders. Tuck your tailbone underneath you. This will prevent your back from arching too much. You should feel a gentle stretch in the front of your thigh or hip. If you do not feel a stretch, slowly lunge forward with your chest up. Hold this position for 30 seconds. Slowly return to the starting position. Repeat 2 times. Complete this exercise 3 times per week. Strengthening exercises These exercises build strength and endurance in your hip and pelvis. Endurance is the ability to use your muscles for a long time, even after they get tired. Exercise B: Bridge (hip extensors)     Lie on your back on a firm surface with your knees bent and your feet flat on the floor. Tighten your buttocks muscles and lift your bottom off the floor until the trunk of  your body is level with your thighs. You should feel the muscles working in your buttocks and the back of your thighs. If this exercise is too easy, cross your arms over your chest or lift one leg while your bottom is up off the floor. Do not arch your back. Hold this position for 3 seconds. Slowly lower your hips to the starting position. Let your muscles relax completely between repetitions. Repeat 2 times. Complete this exercise 3 times per week. Exercise C: Straight leg raises (hip abductors)     Lie on your side with your left / right leg in the top position. Lie so your head, shoulder, knee, and hip line up. Bend your bottom knee to help you balance. Lift your top leg up 4-6 inches (10-15 cm), keeping your toes pointed straight ahead. Hold this position for 2 seconds. Slowly lower your leg to the starting position and let your muscles relax completely. Repeat for a total of 10 repetitions. Repeat 2 times. Complete this exercise 3 times per week. Exercise D: Hip abductors and external rotators, quadruped Get on your hands and knees on a firm, lightly padded surface. Your hands should be directly below your shoulders, and your knees should be directly below your hips. Lift your left / right knee out to the side. Keep your knee bent. Do not twist your body. Hold this position for 3 seconds. Slowly lower your leg. Repeat for a total of  10 repetitions.  Repeat 2 times. Complete this exercise 3 times per week. Exercise E: Single leg stand Stand near a counter or door frame to hold onto as needed. It is helpful to look in a mirror for this exercise so you can watch your hip. Squeeze your left / right buttock muscles then lift up your other foot. Do not let your left / right hip push out to the side. Hold this position for 3 seconds. Repeat for a total of 10 repetitions. Repeat 2 times. Complete this exercise 3 times per week. Make sure you discuss any questions you have with your health care  provider. Document Released: 03/21/2005 Document Revised: 11/26/2015 Document Reviewed: 03/03/2015 Elsevier Interactive Patient Education  2018 [employer].  Wrist and Forearm Exercises Do exercises exactly as told by your health care provider and adjust them as directed. It is normal to feel mild stretching, pulling, tightness, or discomfort as you do these exercises, but you should stop right away if you feel sudden pain or your pain gets worse.   RANGE OF MOTION EXERCISES These exercises warm up your muscles and joints and improve the movement and flexibility of your injured wrist and forearm. These exercises also help to relieve pain, numbness, and tingling. These exercises are done using the muscles in your injured wrist and forearm. Exercise A: Wrist Flexion, Active With your fingers relaxed, bend your wrist forward as far as you can. Hold this position for 30 seconds. Repeat 2 times. Complete this exercise 3 times per week. Exercise B: Wrist Extension, Active With your fingers relaxed, bend your wrist backward as far as you can. Hold this position for 30 seconds. Repeat 2 times. Complete this exercise 3 times per week. Exercise C: Supination, Active  Stand or sit with your arms at your sides. Bend your left / right elbow to an "L" shape (90 degrees). Turn your palm upward until you feel a gentle stretch on the inside of your forearm. Hold this position for 30 seconds. Slowly return your palm to the starting position. Repeat 2 times. Complete this exercise 3 times per week. Exercise D: Pronation, Active  Stand or sit with your arms at your sides. Bend your left / right elbow to an "L" shape (90 degrees). Turn your palm downward until you feel a gentle stretch on the top of your forearm. Hold this position for 30 seconds. Slowly return your palm to the starting position. Repeat 2 times. Complete this exercise once a day.  STRETCHING EXERCISES These exercises warm up your  muscles and joints and improve the movement and flexibility of your injured wrist and forearm. These exercises also help to relieve pain, numbness, and tingling. These exercises are done using your healthy wrist and forearm to help stretch the muscles in your injured wrist and forearm. Exercise E: Wrist Flexion, Passive  Extend your left / right arm in front of you, relax your wrist, and point your fingers downward. Gently push on the back of your hand. Stop when you feel a gentle stretch on the top of your forearm. Hold this position for 30 seconds. Repeat 2 times. Complete this exercise 3 times per week. Exercise F: Wrist Extension, Passive  Extend your left / right arm in front of you and turn your palm upward. Gently pull your palm and fingertips back so your fingers point downward. You should feel a gentle stretch on the palm-side of your forearm. Hold this position for 30 seconds. Repeat 2 times. Complete this exercise  3 times per week. Exercise G: Forearm Rotation, Supination, Passive Sit with your left / right elbow bent to an "L" shape (90 degrees) with your forearm resting on a table. Keeping your upper body and shoulder still, use your other hand to rotate your forearm palm-up until you feel a gentle to moderate stretch. Hold this position for 30 seconds. Slowly release the stretch and return to the starting position. Repeat 2 times. Complete this exercise 3 times per week. Exercise H: Forearm Rotation, Pronation, Passive Sit with your left / right elbow bent to an "L" shape (90 degrees) with your forearm resting on a table. Keeping your upper body and shoulder still, use your other hand to rotate your forearm palm-down until you feel a gentle to moderate stretch. Hold this position for 30 seconds. Slowly release the stretch and return to the starting position. Repeat 2 times. Complete this exercise 3 times per week.  STRENGTHENING EXERCISES These exercises build strength and  endurance in your wrist and forearm. Endurance is the ability to use your muscles for a long time, even after they get tired. Exercise I: Wrist Flexors  Sit with your left / right forearm supported on a table and your hand resting palm-up over the edge of the table. Your elbow should be bent to an "L" shape (about 90 degrees) and be below the level of your shoulder. Hold a 3-5 lb weight in your left / right hand. Or, hold a rubber exercise band or tube in both hands, keeping your hands at the same level and hip distance apart. There should be a slight tension in the exercise band or tube. Slowly curl your hand up toward your forearm. Hold this position for 3 seconds. Slowly lower your hand back to the starting position. Repeat 2 times. Complete this exercise 3 times per week. Exercise J: Wrist Extensors  Sit with your left / right forearm supported on a table and your hand resting palm-down over the edge of the table. Your elbow should be bent to an "L" shape (about 90 degrees) and be below the level of your shoulder. Hold a 3-5 lb weight in your left / right hand. Or, hold a rubber exercise band or tube in both hands, keeping your hands at the same level and hip distance apart. There should be a slight tension in the exercise band or tube. Slowly curl your hand up toward your forearm. Hold this position for 3 seconds. Slowly lower your hand back to the starting position. Repeat 2 times. Complete this exercise 3 times per week. Exercise K: Forearm Rotation, Supination  Sit with your left / right forearm supported on a table and your hand resting palm-down. Your elbow should be at your side, bent to an "L" shape (about 90 degrees), and below the level of your shoulder. Keep your wrist stable and in a neutral position throughout the exercise. Gently hold a lightweight hammer with your left / right hand. Without moving your elbow or wrist, slowly rotate your palm upward to a thumbs-up  position. Hold this position for 3 seconds. Slowly return your forearm to the starting position. Repeat 2 times. Complete this exercise 3 times per week. Exercise L: Forearm Rotation, Pronation  Sit with your left / right forearm supported on a table and your hand resting palm-up. Your elbow should be at your side, bent to an "L" shape (about 90 degrees), and below the level of your shoulder. Keep your wrist stable. Do not allow it to  move backward or forward during the exercise. Gently hold a lightweight hammer with your left / right hand. Without moving your elbow or wrist, slowly rotate your palm and hand upward to a thumbs-up position. Hold this position for 3 seconds. Slowly return your forearm to the starting position. Repeat 2 times. Complete this exercise 3 times per week. Exercise M: Grip Strengthening  Hold one of these items in your left / right hand: play dough, therapy putty, a dense sponge, a stress ball, or a large, rolled sock. Squeeze as hard as you can without increasing pain. Hold this position for 5 seconds. Slowly release your grip. Repeat 2 times. Complete this exercise 3 times per week.  This information is not intended to replace advice given to you by your health care provider. Make sure you discuss any questions you have with your health care provider. Document Released: 02/02/2005 Document Revised: 12/14/2015 Document Reviewed: 12/14/2014 Elsevier Interactive Patient Education  [employer].

## 2022-03-30 NOTE — Progress Notes (Signed)
Chief Complaint  Patient presents with   Transitions Of Care    Here to establish care    Subjective Brendan Gregory is a 41 y.o. male who presents for hypertension follow up. He does not monitor home blood pressures. He is compliant with medications- Lisinopril 20 mg/d, Norvasc 5 mg/d. Patient has these side effects of medication: none He is sometimes adhering to a healthy diet overall. Current exercise: none No CP or SOB.   ST Duration: 1 d Associated symptoms: sinus headache, sinus congestion, rhinorrhea, sore throat, and coughing Denies: sinus pain, itchy watery eyes, ear pain, ear drainage, wheezing, myalgia, and fevers Treatment to date: INCS, Mucinex, Vit C Sick contacts: Yes; daughter  6 weeks of L buttock pain.  He reports throwing on his back around this time.  While it is not getting worse, it is not improving.  No bruising, redness, swelling, clicking/catching.   R wrist pain 2-3 weeks of this. No inj or change in activity. He does type a lot for work. No swelling, bruising, tingling, weakness, redness.    Past Medical History:  Diagnosis Date   Agatston coronary artery calcium score less than 100    Ca score 1.  No CAD on coronary CTA   Hypertension     Exam BP 130/65 (BP Location: Right Arm, Patient Position: Sitting, Cuff Size: Large)   Pulse 82   Temp 98.4 F (36.9 C) (Oral)   Resp 16   Ht '5\' 11"'$  (1.803 m)   Wt 292 lb (132.5 kg)   SpO2 98%   BMI 40.73 kg/m  General:  well developed, well nourished, in no apparent distress HEENT: Ears neg b/l, nares patent w clear rhinorrhea b/l, no sinus ttp, MMM, no pharyngeal erythema or exudate MSK: Tight hamstrings b/l, ttp over L glute med, pain w resisted hip abduction on L Pain along distal R ulna and extensor group without deformity Neuro: gait nml Heart: RRR, no bruits, no LE edema Lungs: clear to auscultation, no accessory muscle use Psych: well oriented with normal range of affect and appropriate  judgment/insight  Essential hypertension  Viral URI with cough  Tendinopathy of left gluteus medius  Chronic, stable. Cont Norvasc 5 mg/d, lisinopril 20 mg/d. Counseled on diet and exercise. Supportive care, Tylenol, ibuprofen. Send message if no better Stretches/exercises. PT f no better. Ice, heat, Tylenol. Stretches/exercises. Sports med if no better. Wrist brace today. Wear at night and during aggravating activities.  F/u in 6 mo. The patient voiced understanding and agreement to the plan.  I spent 32 min w pt discussing the above plans in addition to reviewing his chart on the same day of the visit.   [place], DO 03/30/22  1:07 PM

## 2022-04-05 ENCOUNTER — Encounter: Payer: BC Managed Care – PPO | Admitting: Internal Medicine

## 2022-04-11 ENCOUNTER — Other Ambulatory Visit: Payer: Self-pay

## 2022-04-11 ENCOUNTER — Telehealth: Payer: Self-pay | Admitting: Cardiology

## 2022-04-11 ENCOUNTER — Other Ambulatory Visit: Payer: Self-pay | Admitting: Family Medicine

## 2022-04-11 ENCOUNTER — Telehealth: Payer: Self-pay | Admitting: Family Medicine

## 2022-04-11 DIAGNOSIS — I10 Essential (primary) hypertension: Secondary | ICD-10-CM

## 2022-04-11 MED ORDER — AMLODIPINE BESYLATE 5 MG PO TABS: 5.0000 mg | ORAL_TABLET | Freq: Every day | ORAL | 0 refills | Status: DC

## 2022-04-11 MED ORDER — ATORVASTATIN CALCIUM 10 MG PO TABS: 10.0000 mg | ORAL_TABLET | Freq: Every day | ORAL | 0 refills | Status: DC

## 2022-04-11 NOTE — Telephone Encounter (Signed)
Medication sent.

## 2022-04-11 NOTE — Telephone Encounter (Signed)
*  STAT* If patient is at the pharmacy, call can be transferred to refill team.   1. Which medications need to be refilled? (please list name of each medication and dose if known)   atorvastatin (LIPITOR) 10 MG tablet    2. Which pharmacy/location (including street and city if local pharmacy) is medication to be sent to? [id number]- [place], [place] - [address]   3. Do they need a 30 day or 90 day supply?  90 day   Pt has scheduled appt on 04/12/22

## 2022-04-11 NOTE — Telephone Encounter (Signed)
Pt's medication was sent to pt's pharmacy as requested. Confirmation received.  °

## 2022-04-11 NOTE — Telephone Encounter (Signed)
Looks like rx was filled in an on demand visit and pt is completely out and hoping this can be filled today.   Medication: amLODipine (NORVASC) 5 MG tablet  Has the patient contacted their pharmacy? No.   Preferred Pharmacy:  [id number] [place] [address]Phone: [phone] Fax: [phone]

## 2022-04-12 ENCOUNTER — Encounter: Payer: Self-pay | Admitting: Nurse Practitioner

## 2022-04-12 ENCOUNTER — Ambulatory Visit: Payer: BC Managed Care – PPO | Attending: Nurse Practitioner | Admitting: Nurse Practitioner

## 2022-04-12 VITALS — BP 130/74 | HR 82 | Ht 71.0 in | Wt 286.0 lb

## 2022-04-12 DIAGNOSIS — R0789 Other chest pain: Secondary | ICD-10-CM

## 2022-04-12 DIAGNOSIS — R931 Abnormal findings on diagnostic imaging of heart and coronary circulation: Secondary | ICD-10-CM | POA: Diagnosis not present

## 2022-04-12 DIAGNOSIS — I340 Nonrheumatic mitral (valve) insufficiency: Secondary | ICD-10-CM

## 2022-04-12 DIAGNOSIS — E785 Hyperlipidemia, unspecified: Secondary | ICD-10-CM | POA: Diagnosis not present

## 2022-04-12 DIAGNOSIS — I10 Essential (primary) hypertension: Secondary | ICD-10-CM | POA: Diagnosis not present

## 2022-04-12 NOTE — Patient Instructions (Signed)
Medication Instructions:   Your physician recommends that you continue on your current medications as directed. Please refer to the Current Medication list given to you today.   *If you need a refill on your cardiac medications before your next appointment, please call your pharmacy*   Lab Work:  TODAY!!!! LIPID/DIRECT LDL  If you have labs (blood work) drawn today and your tests are completely normal, you will receive your results only by: [place] (if you have MyChart) OR A paper copy in the mail If you have any lab test that is abnormal or we need to change your treatment, we will call you to review the results.   Testing/Procedures:  None    Follow-Up: At [facility], you and your health needs are our priority.  As part of our continuing mission to provide you with exceptional heart care, we have created designated Provider Care Teams.  These Care Teams include your primary Cardiologist (physician) and Advanced Practice Providers (APPs -  Physician Assistants and Nurse Practitioners) who all work together to provide you with the care you need, when you need it.  We recommend signing up for the patient portal called "MyChart".  Sign up information is provided on this After Visit Summary.  MyChart is used to connect with patients for Virtual Visits (Telemedicine).  Patients are able to view lab/test results, encounter notes, upcoming appointments, etc.  Non-urgent messages can be sent to your provider as well.   To learn more about what you can do with MyChart, go to [email or web address].    Your next appointment:   1 year(s)  The format for your next appointment:   In Person  Provider:   Fransico Him, MD     Other Instructions  Your physician wants you to follow-up in: 1 year with Dr. Radford Pax.  You will receive a reminder letter in the mail two months in advance. If you don't receive a letter, please call our office to schedule the follow-up  appointment.   Important Information About Sugar

## 2022-04-12 NOTE — Progress Notes (Signed)
Cardiology Office Note:    Date:  04/12/2022   ID:  Brendan Gregory, DOB 1980/09/13, MRN [id number]  PCP:  Brendan Pal, DO   [facility] Providers Cardiologist:  Brendan Him, MD     Referring MD: Brendan Gregory*   Chief Complaint: annual follow-up  History of Present Illness:    Brendan Gregory is a pleasant 42 y.o. male with a hx of HTN, OSA on CPAP, occasional marijuana and EtOH use.  Referred to cardiology and seen by Dr. Radford Gregory 01/01/2021 for evaluation of chest pain.  He reported intermittent left sided chest pain since having COVID infection June 2022.  Pain is midsternal and discomfort 3 out of 10 with no radiation to neck or arms.  No associated nausea or diaphoresis. Had a very elevated BP on 1 occasion 181/108 mmHg and EMS was called. EKG was normal and he did not proceed to ED.  He had intentional weight loss of 60 lbs after being given diagnosis of cardiomegaly and morbid obesity. Had feeling of muscle pain under right lower ribs. Feels anxious if he eats anything high in sodium.   He initially underwent CT calcium score 01/2021 and then coronary CTA 02/15/2021 which revealed coronary calcium score of 1. Non-coronary portion showed 3 mm lingular pulmonary nodule, advised to follow-up with PCP. Was referred to pulmonology for f/u. Was advised to start atorvastatin 10 mg daily.  Echocardiogram 01/28/2021 revealed normal LVEF 60 to 96%, normal diastolic parameters, no RWMA, mild MR.  ETT 01/28/2021 revealed 1 mm of horizontal downsloping ST depression in lead III only, very hypertensive response to exercise with systolic BP to 55 mmHg.  Not diagnostic for ischemia, ECG changes felt to be 2/2 hypertensive response.  24-hour BP monitor was prescribed and revealed overall average BP 121/63 mmHg. He was advised to follow-up as needed.   Today, he reports he has not been feeling all that great. Is having trouble sleeping due to discomfort in left rib area, feels  himself getting "ramped" up and feels that BP is increasing. Pain is deep into his left chest/rib area. Feels "jittery." HR speeds up and he can hear his pulse in his right ear. Wears FitBit with average HR 70s bpm. No recorded events of tachycardia or irregular HR. Recently elevated BP which was addressed by PCP.  At one point, SBP was up to 170s. He had headaches. No recent elevated BP readings. Occasionally feels a slight h/a and BP up to 150 if medicine is wearing off. Continues to work on [employer], exercise and weight loss. Was down to 278 lb on home scale.  Diet: breakfast eggs white omelet with lots of spinach, little cheese  Lunch protein shake, Dinner - chicken one vegetable. Does not do well eating out, not even an occasional "cheat day." Exercise is walking 2-3 miles almost daily. He denies DOE, orthopnea, PND, presyncope, syncope.  Past Medical History:  Diagnosis Date   Agatston coronary artery calcium score less than 100    Ca score 1.  No CAD on coronary CTA   Hypertension     Past Surgical History:  Procedure Laterality Date   MOLE REMOVAL     outpatient procedure   testicular     for undescenced testicle    Current Medications: Current Meds  Medication Sig   amLODipine (NORVASC) 5 MG tablet Take 1 tablet (5 mg total) by mouth daily.   atorvastatin (LIPITOR) 10 MG tablet Take 1 tablet (10 mg total) by mouth daily.  Cholecalciferol (VITAMIN D3 PO) Take 5,000 Units by mouth daily.   folic acid (FOLVITE) 119 MCG tablet Take 400 mcg by mouth daily.   lisinopril (ZESTRIL) 20 MG tablet TAKE 1 TABLET BY MOUTH EVERY DAY   magnesium gluconate (MAGONATE) 500 MG tablet Take 500 mg by mouth daily.   Omega-3 Fatty Acids (FISH OIL) 1000 MG CAPS Take by mouth daily.   OVER THE COUNTER MEDICATION Take 1 capsule by mouth daily at 6 (six) AM. Aged beet root     Allergies:   Patient has no known allergies.   Social History   Socioeconomic History   Marital status: Married     Spouse name: Not on file   Number of children: Not on file   Years of education: Not on file   Highest education level: Not on file  Occupational History   Not on file  Tobacco Use   Smoking status: Never   Smokeless tobacco: Never  Vaping Use   Vaping Use: Former   Substances: THC  Substance and Sexual Activity   Alcohol use: Not Currently   Drug use: Not Currently    Frequency: 3.0 times per week    Types: Marijuana    Comment: quit 3 weeks ago   Sexual activity: Not on file  Other Topics Concern   Not on file  Social History Narrative   Not on file   Social Determinants of Health   Financial Resource Strain: Not on file  Food Insecurity: Not on file  Transportation Needs: Not on file  Physical Activity: Not on file  Stress: Not on file  Social Connections: Not on file     Family History: The patient's family history includes Diabetes in his brother, father, and maternal grandfather; Healthy in his mother; Hypertension in his brother and father; Prostate cancer in his father.  ROS:   Please see the history of present illness.    +left side pain All other systems reviewed and are negative.  Labs/Other Studies Reviewed:    The following studies were reviewed today:  CCTA 02/15/21 1. Coronary calcium score of 1.   2. Normal coronary origin with right dominance.   3. CAD-RADS 0. No evidence of CAD (0%). Consider non-atherosclerotic causes of chest pain.  ETT 01/28/21   1.0 mm of down sloping ST depression (III) was noted.   Prior study not available for comparison.   There is 1 mm of horizontal to downsloping ST depression in lead 3 only. Patient had a very hypertensive response to exercise (systolic BP 147). The test is abnormal but not diagnostic for ischemia; ECG changes may be related to hypertensive response.  Echo 01/28/21 1. Left ventricular ejection fraction, by estimation, is 60 to 65%. The  left ventricle has normal function. The left ventricle  has no regional  wall motion abnormalities. Left ventricular diastolic parameters were  normal.   2. Right ventricular systolic function is normal. The right ventricular  size is normal.   3. The mitral valve is normal in structure. Mild mitral valve  regurgitation.   4. The aortic valve is normal in structure. Aortic valve regurgitation is  trivial.   5. The inferior vena cava is normal in size with greater than 50%  respiratory variability, suggesting right atrial pressure of 3 mmHg.  Recent Labs: 03/08/2022: ALT 28; Hemoglobin 16.0; Platelets 307.0; TSH 0.99 03/22/2022: BUN 18; Creatinine, Ser 0.93; Potassium 4.4; Sodium 137  Recent Lipid Panel    Component Value Date/Time   CHOL  112 04/30/2021 0945   TRIG 105 04/30/2021 0945   HDL 35 (L) 04/30/2021 0945   CHOLHDL 3.2 04/30/2021 0945   CHOLHDL 4 11/24/2020 1540   VLDL 32.2 11/24/2020 1540   LDLCALC 57 04/30/2021 0945     Risk Assessment/Calculations:         Physical Exam:    VS:  BP 130/74   Pulse 82   Ht '5\' 11"'$  (1.803 m)   Wt 286 lb (129.7 kg)   SpO2 94%   BMI 39.89 kg/m     Wt Readings from Last 3 Encounters:  04/12/22 286 lb (129.7 kg)  03/30/22 292 lb (132.5 kg)  03/14/22 284 lb 4 oz (128.9 kg)     GEN: Obese, well developed in no acute distress HEENT: Normal NECK: No JVD; No carotid bruits CARDIAC: RRR, no murmurs, rubs, gallops RESPIRATORY:  Clear to auscultation without rales, wheezing or rhonchi  ABDOMEN: Soft, non-tender, non-distended MUSCULOSKELETAL:  bilateral LE mild pitting edema; No deformity. 2+ pedal pulses, equal bilaterally SKIN: Warm and dry NEUROLOGIC:  Alert and oriented x 3 PSYCHIATRIC:  Normal affect   EKG:  EKG is ordered today.  The ekg ordered today demonstrates normal sinus rhythm at 82 bpm, no ST abnormality  Diagnoses:    1. Agatston coronary artery calcium score less than 100   2. Other chest pain   3. Essential hypertension   4. Hyperlipidemia LDL goal <70   5.  Morbid obesity ([place])   6. Nonrheumatic mitral valve regurgitation    Assessment and Plan:     Elevated coronary calcium score: CCTA 01/2021 with Ca score 1. Atypical chest pain as noted below. Will check lipids today for LDL goal < 70.   Atypical chest  pain: Pain in left rib area, non reproducible. Seems to be worse when laying in bed. Has been ongoing for > 1 year. No worsened with exertion. Feels that it is exacerbated by anxiety. Encouraging results from coronary CTA.  No evidence of tachycardia or arrhythmia on Fitbit monitor. No further testing indicated at this time.   Hypertension: BP mildly elevated initially, improved to 130/74 on my recheck. Occasional home SBP 140-150 mmHg. Recent medication adjustments with PCP. No changes today.  Encouraged that with continued weight loss, healthy diet, and exercise he may see less elevated readings.   Hyperlipidemia LDL goal < 70: LDL 57 on 04/30/2021. Will recheck lipid panel today.   Obesity: Encouraged continued healthy diet and lifestyle. 150 minutes moderate intensity exercise each week.   Mitral regurgitation: Mild MR on echo 01/2021. I do not appreciate a significant murmur on exam today. He is asymptomatic. We will continue to monitor clinically for now.      Disposition: 1 year with Dr. Radford Gregory  Medication Adjustments/Labs and Tests Ordered: Current medicines are reviewed at length with the patient today.  Concerns regarding medicines are outlined above.  Orders Placed This Encounter  Procedures   Lipid Panel With LDL/HDL Ratio   EKG 12-Lead   No orders of the defined types were placed in this encounter.   Patient Instructions  Medication Instructions:   Your physician recommends that you continue on your current medications as directed. Please refer to the Current Medication list given to you today.   *If you need a refill on your cardiac medications before your next appointment, please call your pharmacy*   Lab  Work:  TODAY!!!! LIPID/DIRECT LDL  If you have labs (blood work) drawn today and your tests are completely normal,  you will receive your results only by: [place] (if you have MyChart) OR A paper copy in the mail If you have any lab test that is abnormal or we need to change your treatment, we will call you to review the results.   Testing/Procedures:  None    Follow-Up: At [facility], you and your health needs are our priority.  As part of our continuing mission to provide you with exceptional heart care, we have created designated Provider Care Teams.  These Care Teams include your primary Cardiologist (physician) and Advanced Practice Providers (APPs -  Physician Assistants and Nurse Practitioners) who all work together to provide you with the care you need, when you need it.  We recommend signing up for the patient portal called "MyChart".  Sign up information is provided on this After Visit Summary.  MyChart is used to connect with patients for Virtual Visits (Telemedicine).  Patients are able to view lab/test results, encounter notes, upcoming appointments, etc.  Non-urgent messages can be sent to your provider as well.   To learn more about what you can do with MyChart, go to [email or web address].    Your next appointment:   1 year(s)  The format for your next appointment:   In Person  Provider:   Fransico Him, MD     Other Instructions  Your physician wants you to follow-up in: 1 year with Dr. Radford Gregory.  You will receive a reminder letter in the mail two months in advance. If you don't receive a letter, please call our office to schedule the follow-up appointment.   Important Information About Sugar         Signed, Emmaline Life, NP  04/12/2022 5:49 PM    [place]

## 2022-04-13 LAB — LIPID PANEL WITH LDL/HDL RATIO
Cholesterol, Total: 118 mg/dL (ref 100–199)
HDL: 39 mg/dL — ABNORMAL LOW (ref >39)
LDL Chol Calc (NIH): 59 mg/dL (ref 0–99)
LDL/HDL Ratio: 1.5 ratio (ref 0.0–3.6)
Triglycerides: 105 mg/dL (ref 0–149)
VLDL Cholesterol Cal: 20 mg/dL (ref 5–40)

## 2022-04-14 DIAGNOSIS — F411 Generalized anxiety disorder: Secondary | ICD-10-CM | POA: Diagnosis not present

## 2022-04-26 ENCOUNTER — Other Ambulatory Visit: Payer: Self-pay | Admitting: Internal Medicine

## 2022-05-03 ENCOUNTER — Other Ambulatory Visit: Payer: Self-pay | Admitting: Family Medicine

## 2022-05-03 ENCOUNTER — Other Ambulatory Visit: Payer: Self-pay | Admitting: Cardiology

## 2022-05-03 DIAGNOSIS — I10 Essential (primary) hypertension: Secondary | ICD-10-CM

## 2022-05-12 DIAGNOSIS — F411 Generalized anxiety disorder: Secondary | ICD-10-CM | POA: Diagnosis not present

## 2022-05-23 ENCOUNTER — Ambulatory Visit: Payer: BC Managed Care – PPO | Admitting: Family Medicine

## 2022-05-23 ENCOUNTER — Encounter: Payer: Self-pay | Admitting: Family Medicine

## 2022-05-23 VITALS — BP 146/86 | HR 84 | Temp 98.3°F | Ht 71.0 in | Wt 297.2 lb

## 2022-05-23 DIAGNOSIS — I10 Essential (primary) hypertension: Secondary | ICD-10-CM | POA: Diagnosis not present

## 2022-05-23 MED ORDER — AMLODIPINE-OLMESARTAN 10-40 MG PO TABS: 1.0000 | ORAL_TABLET | Freq: Every day | ORAL | 3 refills | Status: DC

## 2022-05-23 NOTE — Patient Instructions (Addendum)
Keep the diet clean and stay active.  Keep checking your blood pressure at home.   Please call your sleep team at: [phone]  Let us know if you need anything.

## 2022-05-23 NOTE — Progress Notes (Signed)
Chief Complaint  Patient presents with   Medication Problem    Blood pressure medications not working    Subjective Brendan Gregory is a 42 y.o. male who presents for hypertension follow up. He does monitor home blood pressures. Blood pressures ranging from 150's/90's on average. He is compliant with medications- Norvasc 5 mg/d, lisinopril 20 mg/d. Patient has these side effects of medication: none He is having associated headaches.  He is sometimes adhering to a healthy diet overall. Current exercise: cardio, lifting wts No CP or SOB.    Past Medical History:  Diagnosis Date   Agatston coronary artery calcium score less than 100    Ca score 1.  No CAD on coronary CTA   Hypertension     Exam BP (!) 146/86 (BP Location: Left Arm, Cuff Size: Large)   Pulse 84   Temp 98.3 F (36.8 C) (Oral)   Ht 5' 11"$  (1.803 m)   Wt 297 lb 4 oz (134.8 kg)   SpO2 97%   BMI 41.46 kg/m  General:  well developed, well nourished, in no apparent distress Heart: RRR, no bruits, no LE edema Lungs: clear to auscultation, no accessory muscle use Psych: well oriented with normal range of affect and appropriate judgment/insight  Essential hypertension - Plan: amLODipine-olmesartan (AZOR) 10-40 MG tablet  Counseled on diet and exercise. Stop Norvasc and lisinopril. Start Azor 10-40 mg/d. Monitor BP at home.  F/u in 1 mo. The patient voiced understanding and agreement to the plan.  [place], DO 05/23/22  4:26 PM

## 2022-05-26 DIAGNOSIS — F411 Generalized anxiety disorder: Secondary | ICD-10-CM | POA: Diagnosis not present

## 2022-05-26 DIAGNOSIS — G4733 Obstructive sleep apnea (adult) (pediatric): Secondary | ICD-10-CM | POA: Diagnosis not present

## 2022-05-30 ENCOUNTER — Encounter: Payer: Self-pay | Admitting: Primary Care

## 2022-05-30 ENCOUNTER — Ambulatory Visit (INDEPENDENT_AMBULATORY_CARE_PROVIDER_SITE_OTHER): Payer: BC Managed Care – PPO | Admitting: Primary Care

## 2022-05-30 VITALS — BP 132/76 | HR 90 | Temp 97.7°F | Ht 71.0 in | Wt 302.0 lb

## 2022-05-30 DIAGNOSIS — R911 Solitary pulmonary nodule: Secondary | ICD-10-CM | POA: Diagnosis not present

## 2022-05-30 DIAGNOSIS — R0683 Snoring: Secondary | ICD-10-CM

## 2022-05-30 DIAGNOSIS — G4733 Obstructive sleep apnea (adult) (pediatric): Secondary | ICD-10-CM

## 2022-05-30 NOTE — Progress Notes (Signed)
$'@Patient'C$  ID: Brendan Gregory, male    DOB: 05/27/1980, 42 y.o.   MRN: [id number]  Chief Complaint  Patient presents with   Follow-up    Doing well.  Some weight gain.  Needs new CPAP.  Currently mask leaking.    Referring provider: [name]*  HPI: 42 year old male, never smoked.  Past medical history significant for obstructive sleep apnea on CPAP, solitary pulmonary nodule, hypertension, generalized anxiety disorder, morbid obesity.  Previous [facility] pulmonary encounter: 05/04/2021 Patient presents today for overdue annual follow-up for OSA.  He was diagnosed with sleep apnea approximately 5 years ago and has been on CPAP since. Reports that he is sleeping well. Cpap machine is no longer working, leaking large amount of air from tubing attachment/holder. This happened two nights ago. Typical bedtime is between 10 PM and 12 AM and he starts his day between 6 and 8 AM.  He gets CPAP supplies through [employer]. Still snoring. He has lost 40-50lbs since last visit. Epworth score 6.   Patient had CT cardiac scoring on October 2022 that showed incidental 43m pulmonary nodule in the interior lingula, previously marijuana smoker. No respiratory symptoms. No cough or hemoptysis.   Airview download 02/02/21-05/02/21 Usage 87/90 days (97%); 92% > 4 hours Average usage 6 hours 55 mins Pressure 16 cm h20 Airleaks 59.2L/min (95%) AHI 0.8   05/30/2022  Interim hx  Patient presents today for annual follow-up/ OSA. Diagnosed with severe sleep apnea in 2016. He had titration study in 2017. His current machine is >5 years. He consistently wears CPAP every night, average 6 hours 38 mins. No issues with mask fit or pressure settings. He is sleeping relatively well. No snoring. Feels a lot better when wearing CPAP, if he does not wear mask at night he reports waking up with sore throat and has mucus production. DME is [employer].   Airview download 04/27/21-05/26/22 Usage 28/30 days used; 27 days (90%) > 4  hours  Average usage 6 hours 38 mins Pressure 16cm h20 Airleaks 108.6L/min AHI 1.1   No Known Allergies  Immunization History  Administered Date(s) Administered   Dtap, Unspecified 04/01/1982, 06/19/1982, 08/28/1982, 09/10/1983, 10/17/1986   HIB, Unspecified 02/04/1984   Hep B, Unspecified 05/12/1994, 06/16/1994, 11/17/1994   MMR 06/10/1983, 05/12/1994   Moderna Sars-Covid-2 Vaccination 07/14/2019, 08/14/2019   Polio, Unspecified 04/01/1982, 06/19/1982, 08/28/1982, 09/10/1983, 10/17/1986   Td (Adult),unspecified 08/29/1995    Past Medical History:  Diagnosis Date   Agatston coronary artery calcium score less than 100    Ca score 1.  No CAD on coronary CTA   Hypertension     Tobacco History: Social History   Tobacco Use  Smoking Status Never   Passive exposure: Never  Smokeless Tobacco Never   Counseling given: Not Answered   Outpatient Medications Prior to Visit  Medication Sig Dispense Refill   amLODipine-olmesartan (AZOR) 10-40 MG tablet Take 1 tablet by mouth daily. 30 tablet 3   atorvastatin (LIPITOR) 10 MG tablet TAKE 1 TABLET BY MOUTH EVERY DAY 90 tablet 3   Cholecalciferol (VITAMIN D3 PO) Take 5,000 Units by mouth daily.     folic acid (FOLVITE) 8Q000111QMCG tablet Take 400 mcg by mouth daily.     Garlic (GARLIQUE) 4A999333MG TBEC Take by mouth.     Omega-3 Fatty Acids (FISH OIL) 1000 MG CAPS Take by mouth daily.     OVER THE COUNTER MEDICATION Take 1 capsule by mouth daily at 6 (six) AM. Aged beet root  No facility-administered medications prior to visit.   Review of Systems  Review of Systems  Constitutional: Negative.   HENT: Negative.    Respiratory: Negative.    Cardiovascular: Negative.    Physical Exam  BP 132/76 (BP Location: Right Arm, Patient Position: Sitting, Cuff Size: Large)   Pulse 90   Temp 97.7 F (36.5 C) (Oral)   Ht '5\' 11"'$  (1.803 m)   Wt (!) 302 lb (137 kg)   SpO2 96%   BMI 42.12 kg/m  Physical Exam Constitutional:       Appearance: Normal appearance.  HENT:     Head: Normocephalic and atraumatic.     Mouth/Throat:     Mouth: Mucous membranes are moist.     Pharynx: Oropharynx is clear.  Cardiovascular:     Rate and Rhythm: Normal rate and regular rhythm.  Pulmonary:     Effort: Pulmonary effort is normal.     Breath sounds: Normal breath sounds.  Musculoskeletal:        General: Normal range of motion.     Cervical back: Normal range of motion and neck supple.  Skin:    General: Skin is warm and dry.  Neurological:     General: No focal deficit present.     Mental Status: He is alert and oriented to person, place, and time. Mental status is at baseline.  Psychiatric:        Mood and Affect: Mood normal.        Behavior: Behavior normal.        Thought Content: Thought content normal.        Judgment: Judgment normal.      Lab Results:  CBC    Component Value Date/Time   WBC 10.2 03/08/2022 1116   RBC 5.23 03/08/2022 1116   HGB 16.0 03/08/2022 1116   HGB 15.0 08/19/2019 0822   HCT 45.9 03/08/2022 1116   HCT 45.0 08/19/2019 0822   PLT 307.0 03/08/2022 1116   PLT 321 08/19/2019 0822   MCV 87.7 03/08/2022 1116   MCV 93 08/19/2019 0822   MCH 31.1 08/19/2019 0822   MCHC 34.9 03/08/2022 1116   RDW 12.9 03/08/2022 1116   RDW 11.9 08/19/2019 0822   LYMPHSABS 2.4 03/08/2022 1116   LYMPHSABS 2.9 08/19/2019 0822   MONOABS 1.4 (H) 03/08/2022 1116   EOSABS 0.1 03/08/2022 1116   EOSABS 0.2 08/19/2019 0822   BASOSABS 0.0 03/08/2022 1116   BASOSABS 0.1 08/19/2019 0822    BMET    Component Value Date/Time   NA 137 03/22/2022 0920   NA 137 02/10/2021 0000   K 4.4 03/22/2022 0920   CL 101 03/22/2022 0920   CO2 30 03/22/2022 0920   GLUCOSE 109 (H) 03/22/2022 0920   BUN 18 03/22/2022 0920   BUN 15 02/10/2021 0000   CREATININE 0.93 03/22/2022 0920   CALCIUM 8.8 03/22/2022 0920   GFRNONAA 80 08/19/2019 0822   GFRAA 92 08/19/2019 0822    BNP No results found for: "BNP"  ProBNP No  results found for: "PROBNP"  Imaging: No results found.   Assessment & Plan:   Obstructive sleep apnea on CPAP - Patient is 93% compliant with CPAP use > 4 hours over the last 30 days. No issues with pressure settings or mask fit. Reports benefit in throat congestion/cough with use. Pressure settings 16cm h20; Residual AHI 1.1/hour. Current CPAP device is > 22 years old and not working properly. Needs replacement CPAP. DME company is [employer]. Encourage patient continue  wear CPAP every night 4-6 hours or longer. He will need follow-up 31-90 days after getting new unit for compliance check.   Solitary pulmonary nodule - Patient had CT cardiac scoring on October 2022 that showed incidental 4m pulmonary nodule in the interior lingula. CTA chest 02/15/21 showed no opacities or effusion. No further follow-up needed. Patient is low risk.   EMartyn Ehrich NP 05/30/2022

## 2022-05-30 NOTE — Assessment & Plan Note (Addendum)
-   Patient had CT cardiac scoring on October 2022 that showed incidental 80m pulmonary nodule in the interior lingula. CTA chest 02/15/21 showed no opacities or effusion. No further follow-up needed. Patient is low risk.

## 2022-05-30 NOTE — Assessment & Plan Note (Signed)
-   Patient is 93% compliant with CPAP use > 4 hours over the last 30 days. No issues with pressure settings or mask fit. Reports benefit in throat congestion/cough with use. Pressure settings 16cm h20; Residual AHI 1.1/hour. Current CPAP device is > 41 years old and not working properly. Needs replacement CPAP. DME company is [employer]. Encourage patient continue wear CPAP every night 4-6 hours or longer. He will need follow-up 31-90 days after getting new unit for compliance check.

## 2022-05-30 NOTE — Patient Instructions (Addendum)
Excellent compliant. No significant residual apneas on current pressure settings Continue to wear CPAP every night 4-6 hours Most recent CTA chest in Nov 2022 did not show lung nodule, previously noted on prior CT scan in Oct 2022 and was very small at 17m and was felt to be benign. You are low risk d/t no previous tobacco smoking  Notify office if you develop persistent cough, blood in mucus or weight loss without trying   Orders: New CPAP device, pressure settings 16cm h20 (sleep study in 2016) DME [facility]   Follow-up: 31- 90 days after getting new CPAP for compliance check (can be virtual)   CPAP and BIPAP Information CPAP and BIPAP are methods that use air pressure to keep your airways open and to help you breathe well. CPAP and BIPAP use different amounts of pressure. Your health care provider will tell you whether CPAP or BIPAP would be more helpful for you. CPAP stands for "continuous positive airway pressure." With CPAP, the amount of pressure stays the same while you breathe in (inhale) and out (exhale). BIPAP stands for "bi-level positive airway pressure." With BIPAP, the amount of pressure will be higher when you inhale and lower when you exhale. This allows you to take larger breaths. CPAP or BIPAP may be used in the hospital, or your health care provider may want you to use it at home. You may need to have a sleep study before your health care provider can order a machine for you to use at home. What are the advantages? CPAP or BIPAP can be helpful if you have: Sleep apnea. Chronic obstructive pulmonary disease (COPD). Heart failure. Medical conditions that cause muscle weakness, including muscular dystrophy or amyotrophic lateral sclerosis (ALS). Other problems that cause breathing to be shallow, weak, abnormal, or difficult. CPAP and BIPAP are most commonly used for obstructive sleep apnea (OSA) to keep the airways from collapsing when the muscles relax during sleep. What are  the risks? Generally, this is a safe treatment. However, problems may occur, including: Irritated skin or skin sores if the mask does not fit properly. Dry or stuffy nose or nosebleeds. Dry mouth. Feeling gassy or bloated. Sinus or lung infection if the equipment is not cleaned properly. When should CPAP or BIPAP be used? In most cases, the mask only needs to be worn during sleep. Generally, the mask needs to be worn throughout the night and during any daytime naps. People with certain medical conditions may also need to wear the mask at other times, such as when they are awake. Follow instructions from your health care provider about when to use the machine. What happens during CPAP or BIPAP?  Both CPAP and BIPAP are provided by a small machine with a flexible plastic tube that attaches to a plastic mask that you wear. Air is blown through the mask into your nose or mouth. The amount of pressure that is used to blow the air can be adjusted on the machine. Your health care provider will set the pressure setting and help you find the best mask for you. Tips for using the mask Because the mask needs to be snug, some people feel trapped or closed-in (claustrophobic) when first using the mask. If you feel this way, you may need to get used to the mask. One way to do this is to hold the mask loosely over your nose or mouth and then gradually apply the mask more snugly. You can also gradually increase the amount of time that you  use the mask. Masks are available in various types and sizes. If your mask does not fit well, talk with your health care provider about getting a different one. Some common types of masks include: Full face masks, which fit over the mouth and nose. Nasal masks, which fit over the nose. Nasal pillow or prong masks, which fit into the nostrils. If you are using a mask that fits over your nose and you tend to breathe through your mouth, a chin strap may be applied to help keep your  mouth closed. Use a skin barrier to protect your skin as told by your health care provider. Some CPAP and BIPAP machines have alarms that may sound if the mask comes off or develops a leak. If you have trouble with the mask, it is very important that you talk with your health care provider about finding a way to make the mask easier to tolerate. Do not stop using the mask. There could be a negative impact on your health if you stop using the mask. Tips for using the machine Place your CPAP or BIPAP machine on a secure table or stand near an electrical outlet. Know where the on/off switch is on the machine. Follow instructions from your health care provider about how to set the pressure on your machine and when you should use it. Do not eat or drink while the CPAP or BIPAP machine is on. Food or fluids could get pushed into your lungs by the pressure of the CPAP or BIPAP. For home use, CPAP and BIPAP machines can be rented or purchased through home health care companies. Many different brands of machines are available. Renting a machine before purchasing may help you find out which particular machine works well for you. Your health insurance company may also decide which machine you may get. Keep the CPAP or BIPAP machine and attachments clean. Ask your health care provider for specific instructions. Check the humidifier if you have a dry stuffy nose or nosebleeds. Make sure it is working correctly. Follow these instructions at home: Take over-the-counter and prescription medicines only as told by your health care provider. Ask if you can take sinus medicine if your sinuses are blocked. Do not use any products that contain nicotine or tobacco. These products include cigarettes, chewing tobacco, and vaping devices, such as e-cigarettes. If you need help quitting, ask your health care provider. Keep all follow-up visits. This is important. Contact a health care provider if: You have redness or pressure  sores on your head, face, mouth, or nose from the mask or head gear. You have trouble using the CPAP or BIPAP machine. You cannot tolerate wearing the CPAP or BIPAP mask. Someone tells you that you snore even when wearing your CPAP or BIPAP. Get help right away if: You have trouble breathing. You feel confused. Summary CPAP and BIPAP are methods that use air pressure to keep your airways open and to help you breathe well. If you have trouble with the mask, it is very important that you talk with your health care provider about finding a way to make the mask easier to tolerate. Do not stop using the mask. There could be a negative impact to your health if you stop using the mask. Follow instructions from your health care provider about when to use the machine. This information is not intended to replace advice given to you by your health care provider. Make sure you discuss any questions you have with your health care  provider. Document Revised: 10/28/2020 Document Reviewed: 02/28/2020 Elsevier Patient Education  [place].

## 2022-06-06 ENCOUNTER — Other Ambulatory Visit: Payer: Self-pay | Admitting: Family Medicine

## 2022-06-06 DIAGNOSIS — I10 Essential (primary) hypertension: Secondary | ICD-10-CM

## 2022-06-07 ENCOUNTER — Encounter: Payer: Self-pay | Admitting: Family Medicine

## 2022-06-07 ENCOUNTER — Ambulatory Visit: Payer: BC Managed Care – PPO | Admitting: Family Medicine

## 2022-06-07 VITALS — BP 120/78 | HR 87 | Temp 98.1°F | Ht 71.0 in | Wt 297.4 lb

## 2022-06-07 DIAGNOSIS — D225 Melanocytic nevi of trunk: Secondary | ICD-10-CM | POA: Diagnosis not present

## 2022-06-07 DIAGNOSIS — I10 Essential (primary) hypertension: Secondary | ICD-10-CM

## 2022-06-07 DIAGNOSIS — D489 Neoplasm of uncertain behavior, unspecified: Secondary | ICD-10-CM

## 2022-06-07 MED ORDER — AMLODIPINE-OLMESARTAN 10-40 MG PO TABS: 0.5000 | ORAL_TABLET | Freq: Every day | ORAL | 1 refills | Status: DC

## 2022-06-07 NOTE — Progress Notes (Signed)
Chief Complaint  Patient presents with   Procedure    Subjective Brendan Gregory is a 42 y.o. male who presents for hypertension follow up. He does monitor home blood pressures. Blood pressures ranging from 120-130's/70-80's on average. He is compliant with medications- Azor 10-40 mg/d. Patient has these side effects of medication: Lightheadedness He is sometimes adhering to a healthy diet overall. No chest pain or shortness of breath.  Lesion on left torso ready to come off today.   Past Medical History:  Diagnosis Date   Agatston coronary artery calcium score less than 100    Ca score 1.  No CAD on coronary CTA   Hypertension     Exam BP 120/78 (BP Location: Left Arm, Patient Position: Sitting, Cuff Size: Large)   Pulse 87   Temp 98.1 F (36.7 C) (Oral)   Ht '5\' 11"'$  (1.803 m)   Wt 297 lb 6 oz (134.9 kg)   SpO2 95%   BMI 41.48 kg/m  General:  well developed, well nourished, in no apparent distress Lungs: no accessory muscle use Skin: On the left torso along the anterior axillary line approximately at the T8 dermatome, there is a raised and hyperpigmented lesion measuring 0.3 x 0.7 cm in diameter.  There is no fluctuance, drainage, erythema, excessive warmth, or TTP. Psych: well oriented with normal range of affect and appropriate judgment/insight  Procedure note; shave biopsy Informed consent was obtained. The area was cleaned with alcohol and injected with 1.5 mL of 1% lidocaine with epinephrine. A Dermablade was slightly bent and used to cut under the area of interest. The specimen was placed in a sterile specimen cup and sent to the lab. The area was then cauterized ensuring adequate hemostasis. The area was dressed with triple antibiotic ointment and a bandage. There were no complications noted. The patient tolerated the procedure well.  Neoplasm of uncertain behavior - Plan: Surgical pathology, PR SHVG SKIN LESION 1 TRUNK/ARM/LEG DIAM 0.6-1.0 CM  Essential  hypertension - Plan: amLODipine-olmesartan (AZOR) 10-40 MG tablet  Shave biopsy today.  Aftercare instructions verbalized and written down.  Warning signs and symptoms verbalized and written down in AVS.  We will decrease his medication to 5-20 mg daily.  He will monitor his blood pressure at home and keep [place] in the loop if it gets greater than 140/90.  Counseled on diet and exercise. F/u as originally scheduled or as needed for #2. The patient voiced understanding and agreement to the plan.  [place], DO 06/07/22  10:02 AM

## 2022-06-07 NOTE — Patient Instructions (Signed)
Do not shower for the rest of the day. When you do wash it, use only soap and water. Do not vigorously scrub. Apply triple antibiotic ointment (like Neosporin) twice daily. Keep the area clean and dry.   Things to look out for: increasing pain not relieved by ibuprofen/acetaminophen, fevers, spreading redness, drainage of pus, or foul odor.  Give [place] 1 week to get the results of your biopsy back.  Take 1/2 tab of your blood pressure medication daily now. Monitor BP at home. Let us know if you aren't doing better.   Let us know if you need anything.

## 2022-06-20 DIAGNOSIS — G4733 Obstructive sleep apnea (adult) (pediatric): Secondary | ICD-10-CM | POA: Diagnosis not present

## 2022-06-23 DIAGNOSIS — F411 Generalized anxiety disorder: Secondary | ICD-10-CM | POA: Diagnosis not present

## 2022-06-24 DIAGNOSIS — G4733 Obstructive sleep apnea (adult) (pediatric): Secondary | ICD-10-CM | POA: Diagnosis not present

## 2022-07-05 ENCOUNTER — Emergency Department (HOSPITAL_BASED_OUTPATIENT_CLINIC_OR_DEPARTMENT_OTHER)
Admission: EM | Admit: 2022-07-05 | Discharge: 2022-07-05 | Discharge status: Against Medical Advice | Payer: BC Managed Care – PPO | Attending: Emergency Medicine | Admitting: Emergency Medicine

## 2022-07-05 ENCOUNTER — Encounter (HOSPITAL_BASED_OUTPATIENT_CLINIC_OR_DEPARTMENT_OTHER): Payer: Self-pay | Admitting: Emergency Medicine

## 2022-07-05 ENCOUNTER — Other Ambulatory Visit: Payer: Self-pay

## 2022-07-05 ENCOUNTER — Telehealth: Payer: Self-pay | Admitting: Family Medicine

## 2022-07-05 DIAGNOSIS — Z5321 Procedure and treatment not carried out due to patient leaving prior to being seen by health care provider: Secondary | ICD-10-CM | POA: Insufficient documentation

## 2022-07-05 DIAGNOSIS — I10 Essential (primary) hypertension: Secondary | ICD-10-CM | POA: Diagnosis not present

## 2022-07-05 NOTE — ED Triage Notes (Signed)
Patient arrived via POV c/o hypertension w/ dizziness and headache x 3hr pta. Patient advised by Provider to be evaluated. Patient denies pain at this time. Patient endorses "jittery feeling", dizziness, and headache. Patient is AO x 4, VS w/ elevated BP, normal gait.

## 2022-07-05 NOTE — Telephone Encounter (Signed)
FYI: This call has been transferred to Access Nurse. Once the result note has been entered staff can address the message at that time.  Patient called in with the following symptoms:  Red Word:elevated blood pressure, dizziness , and Headache   Please advise at Mobile [phone] (mobile)  Message is routed to Provider Pool and [facility] Triage

## 2022-07-06 ENCOUNTER — Ambulatory Visit: Payer: BC Managed Care – PPO | Admitting: Family Medicine

## 2022-07-06 ENCOUNTER — Encounter: Payer: Self-pay | Admitting: Family Medicine

## 2022-07-06 VITALS — BP 146/84 | HR 103 | Temp 98.1°F | Ht 71.0 in | Wt 302.2 lb

## 2022-07-06 DIAGNOSIS — I10 Essential (primary) hypertension: Secondary | ICD-10-CM

## 2022-07-06 MED ORDER — HYDROCHLOROTHIAZIDE 25 MG PO TABS
25.0000 mg | ORAL_TABLET | Freq: Every day | ORAL | 3 refills | Status: DC
Start: 2022-07-06 — End: 2022-07-10

## 2022-07-06 NOTE — Progress Notes (Signed)
Chief Complaint  Patient presents with   Dizziness    Medications are not working     Subjective Brendan Gregory is a 42 y.o. male who presents for hypertension follow up. He does monitor home blood pressures. Blood pressures ranging from 150-160's/80-90's on average. He is compliant with medications- Azor 5-20 mg/d. Patient has these side effects of medication: headache/fogginess He is usually adhering to a healthy diet overall. Current exercise: elliptical- makes him feel better No CP or SOB.    Past Medical History:  Diagnosis Date   Agatston coronary artery calcium score less than 100    Ca score 1.  No CAD on coronary CTA   Hypertension     Exam BP (!) 146/84 (BP Location: Left Arm, Cuff Size: Large)   Pulse (!) 103   Temp 98.1 F (36.7 C) (Oral)   Ht 5\' 11"  (1.803 m)   Wt (!) 302 lb 4 oz (137.1 kg)   SpO2 97%   BMI 42.16 kg/m  General:  well developed, well nourished, in no apparent distress Heart: RRR, no bruits, no LE edema Lungs: clear to auscultation, no accessory muscle use Psych: well oriented with normal range of affect and appropriate judgment/insight  Essential hypertension  AE of med. Stop Azor. Start HCTZ 25 mg/d. Monitor BP at home. Counseled on diet and exercise. F/u in 2 weeks- will also ck BMP at that visit. The patient voiced understanding and agreement to the plan.  [place], DO 07/06/22  8:28 AM

## 2022-07-06 NOTE — Patient Instructions (Signed)
Keep the diet clean and stay active.  Keep checking your blood pressure at home.  Let us know if you need anything.  

## 2022-07-06 NOTE — Telephone Encounter (Signed)
Patient went to the ED on 07/05/2022.

## 2022-07-10 ENCOUNTER — Telehealth: Payer: BC Managed Care – PPO | Admitting: Family

## 2022-07-10 DIAGNOSIS — I10 Essential (primary) hypertension: Secondary | ICD-10-CM

## 2022-07-10 MED ORDER — LOSARTAN POTASSIUM 50 MG PO TABS
50.0000 mg | ORAL_TABLET | Freq: Every day | ORAL | 0 refills | Status: DC
Start: 2022-07-10 — End: 2022-09-28

## 2022-07-10 NOTE — Progress Notes (Signed)
Virtual Visit Consent   Brendan Gregory, you are scheduled for a virtual visit with a [facility] provider today. Just as with appointments in the office, your consent must be obtained to participate. Your consent will be active for this visit and any virtual visit you may have with one of our providers in the next 365 days. If you have a MyChart account, a copy of this consent can be sent to you electronically.  As this is a virtual visit, video technology does not allow for your provider to perform a traditional examination. This may limit your provider's ability to fully assess your condition. If your provider identifies any concerns that need to be evaluated in person or the need to arrange testing (such as labs, EKG, etc.), we will make arrangements to do so. Although advances in technology are sophisticated, we cannot ensure that it will always work on either your end or our end. If the connection with a video visit is poor, the visit may have to be switched to a telephone visit. With either a video or telephone visit, we are not always able to ensure that we have a secure connection.  By engaging in this virtual visit, you consent to the provision of healthcare and authorize for your insurance to be billed (if applicable) for the services provided during this visit. Depending on your insurance coverage, you may receive a charge related to this service.  I need to obtain your verbal consent now. Are you willing to proceed with your visit today? Linton Lortz has provided verbal consent on 07/10/2022 for a virtual visit (video or telephone). Jannifer Rodney, FNP  Date: 07/10/2022 2:19 PM  Virtual Visit via Video Note   I, Jannifer Rodney, connected with  Brendan Gregory  ([id number], [birth year]) on 07/10/22 at 12:45 PM EDT by a video-enabled telemedicine application and verified that I am speaking with the correct person using two identifiers.  Location: Patient: Virtual Visit Location Patient:  Home Provider: Virtual Visit Location Provider: Home Office   I discussed the limitations of evaluation and management by telemedicine and the availability of in person appointments. The patient expressed understanding and agreed to proceed.    History of Present Illness: Brendan Gregory is a 42 y.o. who identifies as a male who was assigned male at birth, and is being seen today for hypertension. He reports his home BP has been 150-160's/79. He reports he has taken multiple medications over the years, but states they "stop working" or becomes sensitive to it. States he is going out of town tomorrow and wants to change medication.   HPI: Hypertension This is a chronic problem. The current episode started more than 1 year ago. The problem has been waxing and waning since onset. The problem is uncontrolled. Associated symptoms include headaches and malaise/fatigue. Pertinent negatives include no peripheral edema or shortness of breath. (dizziness) Past treatments include diuretics. The current treatment provides mild improvement.    Problems:  Patient Active Problem List   Diagnosis Date Noted   Hypokalemia 03/14/2022   Acute cough 03/08/2022   Pansinusitis 03/08/2022   Weakness 03/08/2022   Panic attack 10/25/2021   Generalized anxiety disorder 10/25/2021   Solitary pulmonary nodule 05/04/2021   Agatston coronary artery calcium score less than 100 01/25/2021   Morbid obesity 11/24/2020   Obstructive sleep apnea on CPAP 05/16/2019   Essential hypertension 05/16/2019    Allergies: No Known Allergies Medications:  Current Outpatient Medications:    losartan (COZAAR) 50 MG tablet, Take 1  tablet (50 mg total) by mouth daily., Disp: 90 tablet, Rfl: 0   atorvastatin (LIPITOR) 10 MG tablet, TAKE 1 TABLET BY MOUTH EVERY DAY, Disp: 90 tablet, Rfl: 3   Cholecalciferol (VITAMIN D3 PO), Take 5,000 Units by mouth daily., Disp: , Rfl:    folic acid (FOLVITE) 800 MCG tablet, Take 400 mcg by mouth  daily., Disp: , Rfl:    Garlic (GARLIQUE) 400 MG TBEC, Take by mouth., Disp: , Rfl:    Omega-3 Fatty Acids (FISH OIL) 1000 MG CAPS, Take by mouth daily., Disp: , Rfl:    OVER THE COUNTER MEDICATION, Take 1 capsule by mouth daily at 6 (six) AM. Aged beet root, Disp: , Rfl:   Observations/Objective: Patient is well-developed, well-nourished in no acute distress.  Resting comfortably  at home.  Head is normocephalic, atraumatic.  No labored breathing.  Speech is clear and coherent with logical content.  Patient is alert and oriented at baseline.    Assessment and Plan: 1. Primary hypertension - losartan (COZAAR) 50 MG tablet; Take 1 tablet (50 mg total) by mouth daily.  Dispense: 90 tablet; Refill: 0  Stop HCTZ and start Losartan 50 mg  He will call PCP and make a follow up this week -Daily blood pressure log given with instructions on how to fill out and told to bring to next visit -Dash diet information given -Exercise encouraged - Stress Management  -Continue current meds Follow up if symptoms worsen or do not improve   Follow Up Instructions: I discussed the assessment and treatment plan with the patient. The patient was provided an opportunity to ask questions and all were answered. The patient agreed with the plan and demonstrated an understanding of the instructions.  A copy of instructions were sent to the patient via MyChart unless otherwise noted below.     The patient was advised to call back or seek an in-person evaluation if the symptoms worsen or if the condition fails to improve as anticipated.  Time:  I spent 9 minutes with the patient via telehealth technology discussing the above problems/concerns.    Jannifer Rodney, FNP

## 2022-07-20 ENCOUNTER — Other Ambulatory Visit (INDEPENDENT_AMBULATORY_CARE_PROVIDER_SITE_OTHER): Payer: BC Managed Care – PPO

## 2022-07-20 ENCOUNTER — Ambulatory Visit (INDEPENDENT_AMBULATORY_CARE_PROVIDER_SITE_OTHER): Payer: BC Managed Care – PPO | Admitting: Family Medicine

## 2022-07-20 DIAGNOSIS — I10 Essential (primary) hypertension: Secondary | ICD-10-CM | POA: Diagnosis not present

## 2022-07-20 LAB — BASIC METABOLIC PANEL
BUN: 17 mg/dL (ref 6–23)
CO2: 30 mEq/L (ref 19–32)
Calcium: 9.2 mg/dL (ref 8.4–10.5)
Chloride: 99 mEq/L (ref 96–112)
Creatinine, Ser: 1.01 mg/dL (ref 0.40–1.50)
GFR: 93.05 mL/min (ref >60.00)
Glucose, Bld: 106 mg/dL — ABNORMAL HIGH (ref 70–99)
Potassium: 4.2 mEq/L (ref 3.5–5.1)
Sodium: 140 mEq/L (ref 135–145)

## 2022-07-20 NOTE — Progress Notes (Signed)
Pt here for Blood pressure check per Dr. Carmelia Roller  Pt currently takes: Losartan    Pt reports compliance with medication.  BP today @ =128/82 L arm  HR =80  Pt advised per Dr. Carmelia Roller to continue with Losartan  and follow up in 6 months.

## 2022-07-27 ENCOUNTER — Encounter: Payer: Self-pay | Admitting: Family Medicine

## 2022-07-27 ENCOUNTER — Ambulatory Visit: Payer: BC Managed Care – PPO | Admitting: Family Medicine

## 2022-07-27 VITALS — BP 148/90 | HR 87 | Temp 99.1°F | Ht 71.0 in | Wt 301.5 lb

## 2022-07-27 DIAGNOSIS — I10 Essential (primary) hypertension: Secondary | ICD-10-CM | POA: Diagnosis not present

## 2022-07-27 DIAGNOSIS — F411 Generalized anxiety disorder: Secondary | ICD-10-CM

## 2022-07-27 MED ORDER — CITALOPRAM HYDROBROMIDE 10 MG PO TABS
10.0000 mg | ORAL_TABLET | Freq: Every day | ORAL | 3 refills | Status: DC
Start: 2022-07-27 — End: 2022-11-21

## 2022-07-27 MED ORDER — BUSPIRONE HCL 7.5 MG PO TABS
7.5000 mg | ORAL_TABLET | Freq: Two times a day (BID) | ORAL | 1 refills | Status: DC
Start: 2022-07-27 — End: 2022-09-26

## 2022-07-27 NOTE — Patient Instructions (Signed)
Please consider counseling. Contact [phone] to schedule an appointment or inquire about cost/insurance coverage.  [facility] located at [address], [place], [place].  Phone number = [phone].  Dr. Regan Lemming - Adult Psychiatry.    [facility] located at [address] [place], [place], [place]. Phone number = [phone].   [facility] located at [address] [place], [place].  Phone number = [phone].   [facility] located at [address] [place], [place], [place].  Phone number = [phone].  Keep the diet clean and stay active.  Let us know if you need anything.

## 2022-07-27 NOTE — Progress Notes (Signed)
Chief Complaint  Patient presents with   Follow-up    Blood pressure medications    Subjective Brendan Gregory is a 42 y.o. male who presents for hypertension follow up. He does monitor home blood pressures. Blood pressures ranging from 160's/90's on average. He is compliant with medication- losartan 50 mg/d. Patient has these side effects of medication: none He is usually adhering to a healthy diet overall. Current exercise: walking, going to gym No CP or SOB.  His blood pressure was controlled and then it started to spike several days ago, after an 8-hour Zoom meeting.  Work has been busy and stressful and the meeting was particular stressful for him.  He has a history of anxiety and was previously on Prozac which has not been helpful.  He is not following with a therapist currently.   Past Medical History:  Diagnosis Date   Agatston coronary artery calcium score less than 100    Ca score 1.  No CAD on coronary CTA   Hypertension     Exam BP (!) 148/90 (BP Location: Left Arm, Cuff Size: Large)   Pulse 87   Temp 99.1 F (37.3 C) (Oral)   Ht  (1.803 m)   Wt (!) 301 lb 8 oz (136.8 kg)   SpO2 93%   BMI 42.05 kg/m  General:  well developed, well nourished, in no apparent distress Heart: RRR, no bruits, no LE edema Lungs: clear to auscultation, no accessory muscle use Psych: well oriented with normal range of affect and appropriate judgment/insight  Essential hypertension  Generalized anxiety disorder - Plan: citalopram (CELEXA) 10 MG tablet, busPIRone (BUSPAR) 7.5 MG tablet  Chronic, uncontrolled.  Continue losartan 50 mg daily.  Likely due to #2.  Counseled on diet and exercise. Chronic, uncontrolled.  Start Celexa 10 mg daily, BuSpar 7.5 mg twice daily as needed.  Counseling information provided in this paperwork. F/u in 5 weeks. The patient voiced understanding and agreement to the plan.  Jilda Roche [place], DO 07/27/22  12:38 PM

## 2022-08-24 DIAGNOSIS — G4733 Obstructive sleep apnea (adult) (pediatric): Secondary | ICD-10-CM | POA: Diagnosis not present

## 2022-08-27 ENCOUNTER — Other Ambulatory Visit: Payer: Self-pay | Admitting: Family Medicine

## 2022-09-24 DIAGNOSIS — G4733 Obstructive sleep apnea (adult) (pediatric): Secondary | ICD-10-CM | POA: Diagnosis not present

## 2022-09-26 ENCOUNTER — Other Ambulatory Visit: Payer: Self-pay | Admitting: Family Medicine

## 2022-09-26 ENCOUNTER — Other Ambulatory Visit: Payer: Self-pay | Admitting: Family

## 2022-09-26 DIAGNOSIS — I10 Essential (primary) hypertension: Secondary | ICD-10-CM

## 2022-09-26 DIAGNOSIS — F411 Generalized anxiety disorder: Secondary | ICD-10-CM

## 2022-09-28 ENCOUNTER — Encounter: Payer: Self-pay | Admitting: Family Medicine

## 2022-09-28 ENCOUNTER — Ambulatory Visit (INDEPENDENT_AMBULATORY_CARE_PROVIDER_SITE_OTHER): Payer: BC Managed Care – PPO | Admitting: Family Medicine

## 2022-09-28 VITALS — BP 130/78 | HR 69 | Temp 98.0°F | Ht 71.0 in | Wt 303.1 lb

## 2022-09-28 DIAGNOSIS — Z125 Encounter for screening for malignant neoplasm of prostate: Secondary | ICD-10-CM

## 2022-09-28 DIAGNOSIS — I10 Essential (primary) hypertension: Secondary | ICD-10-CM

## 2022-09-28 DIAGNOSIS — Z1159 Encounter for screening for other viral diseases: Secondary | ICD-10-CM

## 2022-09-28 DIAGNOSIS — Z23 Encounter for immunization: Secondary | ICD-10-CM

## 2022-09-28 DIAGNOSIS — Z0000 Encounter for general adult medical examination without abnormal findings: Secondary | ICD-10-CM | POA: Diagnosis not present

## 2022-09-28 LAB — COMPREHENSIVE METABOLIC PANEL
ALT: 49 U/L (ref 0–53)
AST: 26 U/L (ref 0–37)
Albumin: 4.4 g/dL (ref 3.5–5.2)
Alkaline Phosphatase: 96 U/L (ref 39–117)
BUN: 18 mg/dL (ref 6–23)
CO2: 30 mEq/L (ref 19–32)
Calcium: 9.5 mg/dL (ref 8.4–10.5)
Chloride: 101 mEq/L (ref 96–112)
Creatinine, Ser: 0.95 mg/dL (ref 0.40–1.50)
GFR: 100.01 mL/min (ref >60.00)
Glucose, Bld: 92 mg/dL (ref 70–99)
Potassium: 4.2 mEq/L (ref 3.5–5.1)
Sodium: 137 mEq/L (ref 135–145)
Total Bilirubin: 0.6 mg/dL (ref 0.2–1.2)
Total Protein: 7.3 g/dL (ref 6.0–8.3)

## 2022-09-28 LAB — CBC
HCT: 44.4 % (ref 39.0–52.0)
Hemoglobin: 14.6 g/dL (ref 13.0–17.0)
MCHC: 33 g/dL (ref 30.0–36.0)
MCV: 89.7 fl (ref 78.0–100.0)
Platelets: 285 10*3/uL (ref 150.0–400.0)
RBC: 4.95 Mil/uL (ref 4.22–5.81)
RDW: 13.1 % (ref 11.5–15.5)
WBC: 9.1 10*3/uL (ref 4.0–10.5)

## 2022-09-28 LAB — LIPID PANEL
Cholesterol: 106 mg/dL (ref 0–200)
HDL: 37.8 mg/dL — ABNORMAL LOW (ref >39.00)
LDL Cholesterol: 48 mg/dL (ref 0–99)
NonHDL: 68.27
Total CHOL/HDL Ratio: 3
Triglycerides: 102 mg/dL (ref 0.0–149.0)
VLDL: 20.4 mg/dL (ref 0.0–40.0)

## 2022-09-28 LAB — PSA: PSA: 0.28 ng/mL (ref 0.10–4.00)

## 2022-09-28 MED ORDER — LOSARTAN POTASSIUM 50 MG PO TABS: 50.0000 mg | ORAL_TABLET | Freq: Every day | ORAL | 3 refills | Status: DC

## 2022-09-28 NOTE — Progress Notes (Signed)
Chief Complaint  Patient presents with   Annual Exam    Well Male Brendan Gregory is here for a complete physical.   His last physical was >1 year ago.  Current diet: in general, a "healthy" diet.   Current exercise: elliptical machine Weight trend: stable Fatigue out of ordinary? No. Seat belt? Yes.   Advanced directive? No  Health maintenance Tetanus- Due- will receive today HIV- Yes Hep C- No  Past Medical History:  Diagnosis Date   Agatston coronary artery calcium score less than 100    Ca score 1.  No CAD on coronary CTA   Hypertension      Past Surgical History:  Procedure Laterality Date   MOLE REMOVAL     outpatient procedure   testicular     for undescenced testicle    Medications  Current Outpatient Medications on File Prior to Visit  Medication Sig Dispense Refill   atorvastatin (LIPITOR) 10 MG tablet TAKE 1 TABLET BY MOUTH EVERY DAY 90 tablet 3   Cholecalciferol (VITAMIN D3 PO) Take 5,000 Units by mouth daily.     citalopram (CELEXA) 10 MG tablet Take 1 tablet (10 mg total) by mouth daily. 30 tablet 3   folic acid (FOLVITE) 800 MCG tablet Take 400 mcg by mouth daily.     Garlic (GARLIQUE) 400 MG TBEC Take by mouth.     Omega-3 Fatty Acids (FISH OIL) 1000 MG CAPS Take by mouth daily.     OVER THE COUNTER MEDICATION Take 1 capsule by mouth daily at 6 (six) AM. Aged beet root      Allergies No Known Allergies  Family History Family History  Problem Relation Age of Onset   Healthy Mother    Prostate cancer Father 31   Diabetes Father    Hypertension Father    Diabetes Brother    Hypertension Brother    Diabetes Maternal Grandfather     Review of Systems: Constitutional: no fevers or chills Eye:  no recent significant change in vision Ear/Nose/Mouth/Throat:  Ears:  no hearing loss Nose/Mouth/Throat:  no complaints of nasal congestion, no sore throat Cardiovascular:  no chest pain Respiratory:  no shortness of breath Gastrointestinal:  no  abdominal pain, no change in bowel habits GU:  Male: negative for dysuria, frequency, and incontinence Musculoskeletal/Extremities:  no pain of the joints Integumentary (Skin/Breast):  no abnormal skin lesions reported Neurologic:  no headaches Endocrine: No unexpected weight changes Hematologic/Lymphatic:  no night sweats  Exam BP 130/78 (BP Location: Left Arm, Patient Position: Sitting, Cuff Size: Large)   Pulse 69   Temp 98 F (36.7 C) (Oral)   Ht 5\' 11"  (1.803 m)   Wt (!) 303 lb 2 oz (137.5 kg)   SpO2 98%   BMI 42.28 kg/m  General:  well developed, well nourished, in no apparent distress Skin:  no significant moles, warts, or growths Head:  no masses, lesions, or tenderness Eyes:  pupils equal and round, sclera anicteric without injection Ears:  canals without lesions, TMs shiny without retraction, no obvious effusion, no erythema Nose:  nares patent, mucosa normal Throat/Pharynx:  lips and gingiva without lesion; tongue and uvula midline; non-inflamed pharynx; no exudates or postnasal drainage Neck: neck supple without adenopathy, thyromegaly, or masses Lungs:  clear to auscultation, breath sounds equal bilaterally, no respiratory distress Cardio:  regular rate and rhythm, no bruits, no LE edema Abdomen:  abdomen soft, nontender; bowel sounds normal; no masses or organomegaly Rectal: Deferred Musculoskeletal:  symmetrical muscle groups noted  without atrophy or deformity Extremities:  no clubbing, cyanosis, or edema, no deformities, no skin discoloration Neuro:  gait normal; deep tendon reflexes normal and symmetric Psych: well oriented with normal range of affect and appropriate judgment/insight  Assessment and Plan  Well adult exam - Plan: CBC, Comprehensive metabolic panel, Lipid panel  Need for Tdap vaccination - Plan: Tdap vaccine greater than or equal to 7yo IM  Primary hypertension - Plan: losartan (COZAAR) 50 MG tablet  Screening for prostate cancer - Plan:  PSA  Encounter for hepatitis C screening test for low risk patient - Plan: Hepatitis C antibody   Well 42 y.o. male. Counseled on diet and exercise. Advanced directive form provided today. Discussed prostate cancer screening with  PSA testing. With his famhx in dad, he would like to undergo testing.  Other orders as above. Tdap today.  Follow up in 6 mo pending the above workup. The patient voiced understanding and agreement to the plan.  Jilda Roche [place], DO 09/28/22 9:50 AM

## 2022-09-28 NOTE — Patient Instructions (Signed)
Give [place] 2-3 business days to get the results of your labs back.   Keep the diet clean and stay active.  Please consider adding some weight resistance exercise to your routine. Consider yoga as well.   Please get me a copy of your advanced directive form at your convenience.   Let us know if you need anything.

## 2022-09-29 LAB — HEPATITIS C ANTIBODY: Hepatitis C Ab: NONREACTIVE

## 2022-10-24 DIAGNOSIS — G4733 Obstructive sleep apnea (adult) (pediatric): Secondary | ICD-10-CM | POA: Diagnosis not present

## 2022-11-21 ENCOUNTER — Other Ambulatory Visit: Payer: Self-pay | Admitting: Family Medicine

## 2022-11-21 DIAGNOSIS — F411 Generalized anxiety disorder: Secondary | ICD-10-CM

## 2022-11-24 DIAGNOSIS — G4733 Obstructive sleep apnea (adult) (pediatric): Secondary | ICD-10-CM | POA: Diagnosis not present

## 2022-11-30 ENCOUNTER — Ambulatory Visit: Payer: BC Managed Care – PPO | Admitting: Primary Care

## 2022-11-30 ENCOUNTER — Telehealth: Payer: Self-pay | Admitting: Primary Care

## 2022-11-30 NOTE — Telephone Encounter (Signed)
Please call patient and schedule visit with me tomorrow as I have an opening

## 2022-11-30 NOTE — Progress Notes (Deleted)
@Patient  ID: Brendan Gregory, male    DOB: 06/12/1980, 42 y.o.   MRN: [id number]  No chief complaint on file.   Referring provider: Sharlene Dory*  HPI:   No Known Allergies  Immunization History  Administered Date(s) Administered   Dtap, Unspecified 04/01/1982, 06/19/1982, 08/28/1982, 09/10/1983, 10/17/1986   HIB, Unspecified 02/04/1984   Hep B, Unspecified 05/12/1994, 06/16/1994, 11/17/1994   MMR 06/10/1983, 05/12/1994   Moderna Sars-Covid-2 Vaccination 07/14/2019, 08/14/2019   Polio, Unspecified 04/01/1982, 06/19/1982, 08/28/1982, 09/10/1983, 10/17/1986   Td (Adult),unspecified 08/29/1995   Tdap 09/28/2022    Past Medical History:  Diagnosis Date   Agatston coronary artery calcium score less than 100    Ca score 1.  No CAD on coronary CTA   Hypertension     Tobacco History: Social History   Tobacco Use  Smoking Status Never   Passive exposure: Never  Smokeless Tobacco Never   Counseling given: Not Answered   Outpatient Medications Prior to Visit  Medication Sig Dispense Refill   atorvastatin (LIPITOR) 10 MG tablet TAKE 1 TABLET BY MOUTH EVERY DAY 90 tablet 3   Cholecalciferol (VITAMIN D3 PO) Take 5,000 Units by mouth daily.     citalopram (CELEXA) 10 MG tablet TAKE 1 TABLET BY MOUTH EVERY DAY 30 tablet 3   folic acid (FOLVITE) 800 MCG tablet Take 400 mcg by mouth daily.     Garlic (GARLIQUE) 400 MG TBEC Take by mouth.     losartan (COZAAR) 50 MG tablet Take 1 tablet (50 mg total) by mouth daily. 90 tablet 3   Omega-3 Fatty Acids (FISH OIL) 1000 MG CAPS Take by mouth daily.     OVER THE COUNTER MEDICATION Take 1 capsule by mouth daily at 6 (six) AM. Aged beet root     No facility-administered medications prior to visit.      Review of Systems  Review of Systems   Physical Exam  There were no vitals taken for this visit. Physical Exam   Lab Results:  CBC    Component Value Date/Time   WBC 9.1 09/28/2022 1007   RBC 4.95 09/28/2022  1007   HGB 14.6 09/28/2022 1007   HGB 15.0 08/19/2019 0822   HCT 44.4 09/28/2022 1007   HCT 45.0 08/19/2019 0822   PLT 285.0 09/28/2022 1007   PLT 321 08/19/2019 0822   MCV 89.7 09/28/2022 1007   MCV 93 08/19/2019 0822   MCH 31.1 08/19/2019 0822   MCHC 33.0 09/28/2022 1007   RDW 13.1 09/28/2022 1007   RDW 11.9 08/19/2019 0822   LYMPHSABS 2.4 03/08/2022 1116   LYMPHSABS 2.9 08/19/2019 0822   MONOABS 1.4 (H) 03/08/2022 1116   EOSABS 0.1 03/08/2022 1116   EOSABS 0.2 08/19/2019 0822   BASOSABS 0.0 03/08/2022 1116   BASOSABS 0.1 08/19/2019 0822    BMET    Component Value Date/Time   NA 137 09/28/2022 1007   NA 137 02/10/2021 0000   K 4.2 09/28/2022 1007   CL 101 09/28/2022 1007   CO2 30 09/28/2022 1007   GLUCOSE 92 09/28/2022 1007   BUN 18 09/28/2022 1007   BUN 15 02/10/2021 0000   CREATININE 0.95 09/28/2022 1007   CALCIUM 9.5 09/28/2022 1007   GFRNONAA 80 08/19/2019 0822   GFRAA 92 08/19/2019 0822    BNP No results found for: "BNP"  ProBNP No results found for: "PROBNP"  Imaging: No results found.   Assessment & Plan:   No problem-specific Assessment & Plan notes found for this encounter.  Glenford Bayley, NP 11/30/2022

## 2022-11-30 NOTE — Telephone Encounter (Signed)
Scheduled OV for tomorrow at 8:30 am MyChart visit.

## 2022-11-30 NOTE — Telephone Encounter (Signed)
Patient had to cancel his compliance visit for today (he was unable to wake his 41 year old daughter in time for appointment). Scheduled for next available on 10/1 however, he is afraid this will be outside of his 30-90 day compliance for CPAP. Is there a way to review his complaince prior to his visit? I'll also be keeping an eye on the schedule to see if we can get him in prior. Please advise.

## 2022-12-01 ENCOUNTER — Telehealth (INDEPENDENT_AMBULATORY_CARE_PROVIDER_SITE_OTHER): Payer: BC Managed Care – PPO | Admitting: Primary Care

## 2022-12-01 DIAGNOSIS — G4733 Obstructive sleep apnea (adult) (pediatric): Secondary | ICD-10-CM

## 2022-12-01 DIAGNOSIS — G4730 Sleep apnea, unspecified: Secondary | ICD-10-CM | POA: Diagnosis not present

## 2022-12-01 NOTE — Progress Notes (Signed)
Virtual Visit via Video Note  I connected with Brendan Gregory on 12/01/22 at  8:30 AM EDT by a video enabled telemedicine application and verified that I am speaking with the correct person using two identifiers.  Location: Patient: Home Provider: office    I discussed the limitations of evaluation and management by telemedicine and the availability of in person appointments. The patient expressed understanding and agreed to proceed.  History of Present Illness: 42 year old male, never smoked.  Past medical history significant for obstructive sleep apnea on CPAP, solitary pulmonary nodule, hypertension, generalized anxiety disorder, morbid obesity.  Previous [facility] pulmonary encounter: 05/04/2021 Patient presents today for overdue annual follow-up for OSA.  He was diagnosed with sleep apnea approximately 5 years ago and has been on CPAP since. Reports that he is sleeping well. Cpap machine is no longer working, leaking large amount of air from tubing attachment/holder. This happened two nights ago. Typical bedtime is between 10 PM and 12 AM and he starts his day between 6 and 8 AM.  He gets CPAP supplies through [facility]. Still snoring. He has lost 40-50lbs since last visit. Epworth score 6.   Patient had CT cardiac scoring on October 2022 that showed incidental 3mm pulmonary nodule in the interior lingula, previously marijuana smoker. No respiratory symptoms. No cough or hemoptysis.   Airview download 02/02/21-05/02/21 Usage 87/90 days (97%); 92% > 4 hours Average usage 6 hours 55 mins Pressure 16 cm h20 Airleaks 59.2L/min (95%) AHI 0.8   05/30/2022  Patient presents today for annual follow-up/ OSA. Diagnosed with severe sleep apnea in 2016. He had titration study in 2017. His current machine is >5 years. He consistently wears CPAP every night, average 6 hours 38 mins. No issues with mask fit or pressure settings. He is sleeping relatively well. No snoring. Feels a lot better when wearing CPAP, if  he does not wear mask at night he reports waking up with sore throat and has mucus production. DME is [facility].   Airview download 04/27/21-05/26/22 Usage 28/30 days used; 27 days (90%) > 4 hours  Average usage 6 hours 38 mins Pressure 16cm h20 Airleaks 108.6L/min AHI 1.1  12/01/2022- Interim hx  Patient contacted today for CPAP compliance check. Since last visit he received new CPAP machine. He remains compliant with use. Sleeping alright. Feels pressure may not be strong enough. Wife has hear him snoring through his mask. Current pressure 16cm h20 with residual AHI 1.9/hour. Using medium size full face mask, having significantly less airleaks. Uses an adjustable bed frame.   Airview download 10/30/2022 - 11/28/2022 Usage days 29/30 days (97%); 29 days (97%) greater than 4 hours Average usage 7 hours 12 minutes Airleaks 12L/min  AHI 1.9   Observations/Objective:  - Appears well; No overt shortness of breath/wheezing   Assessment and Plan:  Severe sleep apnea: - Hx severe sleep apnea dx in 2016, titration study done in 2017  - Received new CPAP machine Spring 2024 - Patient is 29% compliant with CPAP use >4 hours - Current pressure 16cm h20; Residual AHI 1.9 - We will increase pressure setting to 17cm h20 for comfort - Renew CPAP supplies with [employer] - Advised patient continue to wear CPAP nightly 4-6 hours, work on weight loss and focus on side sleeping position or elevate head of bed  Follow Up Instructions:  1 year with Beth NP or sooner if needed    I discussed the assessment and treatment plan with the patient. The patient was provided an opportunity to ask  questions and all were answered. The patient agreed with the plan and demonstrated an understanding of the instructions.   The patient was advised to call back or seek an in-person evaluation if the symptoms worsen or if the condition fails to improve as anticipated.  I provided 22 minutes of non-face-to-face time during  this encounter.   Glenford Bayley, NP

## 2022-12-25 DIAGNOSIS — G4733 Obstructive sleep apnea (adult) (pediatric): Secondary | ICD-10-CM | POA: Diagnosis not present

## 2023-01-03 ENCOUNTER — Ambulatory Visit: Payer: BC Managed Care – PPO | Admitting: Primary Care

## 2023-01-06 ENCOUNTER — Ambulatory Visit: Payer: BC Managed Care – PPO | Admitting: Family Medicine

## 2023-01-06 ENCOUNTER — Encounter: Payer: Self-pay | Admitting: Family Medicine

## 2023-01-06 VITALS — BP 148/84 | HR 91 | Temp 99.5°F | Ht 71.0 in | Wt 301.2 lb

## 2023-01-06 DIAGNOSIS — Z6841 Body Mass Index (BMI) 40.0 and over, adult: Secondary | ICD-10-CM | POA: Diagnosis not present

## 2023-01-06 DIAGNOSIS — I10 Essential (primary) hypertension: Secondary | ICD-10-CM

## 2023-01-06 MED ORDER — SEMAGLUTIDE-WEIGHT MANAGEMENT 2.4 MG/0.75ML ~~LOC~~ SOAJ
2.4000 mg | SUBCUTANEOUS | 0 refills | Status: DC
Start: 2023-05-02 — End: 2023-01-25

## 2023-01-06 MED ORDER — SEMAGLUTIDE-WEIGHT MANAGEMENT 1 MG/0.5ML ~~LOC~~ SOAJ
1.0000 mg | SUBCUTANEOUS | 0 refills | Status: DC
Start: 2023-03-05 — End: 2023-01-25

## 2023-01-06 MED ORDER — SEMAGLUTIDE-WEIGHT MANAGEMENT 0.5 MG/0.5ML ~~LOC~~ SOAJ
0.5000 mg | SUBCUTANEOUS | 0 refills | Status: DC
Start: 2023-02-04 — End: 2023-01-25

## 2023-01-06 MED ORDER — SEMAGLUTIDE-WEIGHT MANAGEMENT 1.7 MG/0.75ML ~~LOC~~ SOAJ
1.7000 mg | SUBCUTANEOUS | 0 refills | Status: DC
Start: 2023-04-03 — End: 2023-01-25

## 2023-01-06 MED ORDER — AMLODIPINE-OLMESARTAN 5-20 MG PO TABS
1.0000 | ORAL_TABLET | Freq: Every day | ORAL | 1 refills | Status: DC
Start: 2023-01-06 — End: 2023-01-25

## 2023-01-06 MED ORDER — SEMAGLUTIDE-WEIGHT MANAGEMENT 0.25 MG/0.5ML ~~LOC~~ SOAJ
0.2500 mg | SUBCUTANEOUS | 0 refills | Status: DC
Start: 2023-01-06 — End: 2023-01-25

## 2023-01-06 NOTE — Patient Instructions (Addendum)
Keep the diet clean and stay active.  Aim to do some physical exertion for 150 minutes per week. This is typically divided into 5 days per week, 30 minutes per day. The activity should be enough to get your heart rate up. Anything is better than nothing if you have time constraints.  Let me know if there are cost or supply issues with the medication.   Check your blood pressures 2-3 times per week, alternating the time of day you check it. If it is high, considering waiting 1-2 minutes and rechecking. If it gets higher, your anxiety is likely creeping up and we should avoid rechecking.   Send me a message in 2-3 weeks with some BP readings.   Let us know if you need anything.  Stretching and range of motion exercises These exercises warm up your muscles and joints and improve the movement and flexibility of your knee. These exercises also help to relieve pain and stiffness.  Exercise A: Knee flexion, active Lie on your back with both knees straight. If this causes back discomfort, bend your uninjured knee so your foot is flat on the floor. Slowly slide your left / right heel back toward your buttocks until you feel a gentle stretch in the front of your knee or thigh. Stop if you have pain. Hold for3 seconds. Slowly slide your left / right heel back to the starting position. 10 total repetitions. Repeat 2 times. Complete this exercise 3 times a week.  Exercise B: Knee extension, sitting Sit with your left / right heel propped on a chair, a coffee table, or a footstool. Do not have anything under your knee to support it. Allow your leg muscles to relax, letting gravity straighten out your knee. You should feel a stretch behind your left / right knee. If told by your health care provide just above your kneecap. Hold this position for 3 seconds. Repeat for a total of 10 repetitions. Repeat 2 times. Complete this stretch 3 times a week.  Strengthening exercises These exercises build  strength and endurance in your knee. Endurance is the ability to use your muscles for a long time, even after they get tired.  Exercise C: Quadriceps, isometric Lie on your back with your left / right leg extended and your other knee bent. Put a rolled towel or small pillow under your right/left knee if told by your health care provider. Slowly tense the muscles in the front of your left / right thigh by pushing the back of your knee down. You should see your knee cap slide up toward your hip or see increased dimpling just above the knee. For 3 seconds, keep the muscle as tight as you can without increasing your pain. Relax the muscles slowly and completely. Repeat for 10 total repetitions. Repeat 2 times. Complete this exercise 3 times a week. Exercise D: Straight leg raises (quadriceps) Lie on your back with your left / right leg extended and your other knee bent. Tense the muscles in the front of your left / right thigh. You should see your kneecap slide up or see increased dimpling just above the knee. Keep these muscles tight as you raise your leg 4-6 inches (10-15 cm) off the floor. Hold this position for 3 seconds. Keep these muscles tense as you lower your leg. Relax the muscles slowly and completely. Repeat for a total of 10 repetitions. Repeat 2 times. Complete this exercise 3 times a week.  Exercise E: Hamstring curls On the floor  or a bed, lie on your abdomen with your legs straight. Put a folded towel or small pillow under your left / right thigh, just above your kneecap. Slowly bend your left / right knee as far as you can without pain. Keep your hips flat against the floor or bed. Hold this position for 3 seconds. Slowly lower your leg to the starting position. Repeat for a total of 10 repetitions. Repeat 2 times. Complete this exercise 3 times per week.  Stretching exercises These exercises warm up your muscles and joints and improve the movement and flexibility of your knee.  These exercises also help to relieve pain and stiffness.  Exercise A: Quadriceps, prone Lie on your abdomen on a firm surface, such as a bed or padded floor. Bend your left / right knee and hold your ankle. If you cannot reach your ankle or pant leg, loop a belt around your foot and grab the belt instead. Gently pull your heel toward your buttocks. Your knee should not slide out to the side. You should feel a stretch in the front of your thigh and knee. Hold this position for 30 seconds. Repeat 2 times. Complete this stretch 3 times a week.  Exercise B: Hamstring, doorway Lie on your back in front of a doorway with your left / right leg resting against the wall and your other leg flat on the floor in the doorway. There should be a slight bend in your left / right knee. Straighten your left / right knee. You should feel a stretch behind your knee or thigh. If you do not feel that stretch, scoot your buttocks closer to the door. Hold this position for 30 seconds. Repeat 2 times. Complete this stretch 3 times a week.  Strengthening exercises These exercises build strength and endurance in your knee and leg muscles. Endurance is the ability to use your muscles for a long time, even after they get tired.   Exercise D: Wall slides (quadriceps) Lean your back against a smooth wall or door, and walk your feet out 18-24 inches (45-61 cm) from it. Place your feet hip-width apart. Slowly slide down the wall or door until your knees bend 90 degrees. Keep your knees over your heels, not over your toes. Keep your knees in line with your hips. Hold for 2 seconds. Stand up to rest for 60 seconds. Repeat 2 times. Complete this exercise 3 times a week.  Exercise E: Bridge (hip extensors) Lie on your back on a firm surface with your knees bent and your feet flat on the floor. Tighten your buttocks muscles and lift your bottom off the floor until your trunk is level with your thighs. Do not arch your  back. You should feel the muscles working in your buttocks and the back of your thighs. Hold this position for 2 seconds. Slowly lower your hips to the starting position. Let your buttocks muscles relax completely between repetitions. Repeat 2 times. Complete this exercise 3 times a week.

## 2023-01-06 NOTE — Progress Notes (Signed)
Chief Complaint  Patient presents with   Hypertension    Subjective Brendan Gregory is a 42 y.o. male who presents for hypertension follow up. He does monitor home blood pressures. Blood pressures ranging from 180's/80's on average. He is compliant with medications- losartan 50 mg/d. Patient has these side effects of medication: none He is not adhering to a healthy diet overall. Current exercise: some cardio Has had headaches over the past 2 weeks which tells No CP or SOB.   Obesity Diet/exercise as above. Has never been on meds before but is interested. Has never been thru a supervised wt loss program.    Past Medical History:  Diagnosis Date   Agatston coronary artery calcium score less than 100    Ca score 1.  No CAD on coronary CTA   Hypertension     Exam BP (!) 148/84 (BP Location: Left Arm, Cuff Size: Large)   Pulse 91   Temp 99.5 F (37.5 C) (Oral)   Ht 5\' 11"  (1.803 m)   Wt (!) 301 lb 4 oz (136.6 kg)   SpO2 93%   BMI 42.02 kg/m  General:  well developed, well nourished, in no apparent distress Heart: RRR, no bruits, no LE edema Lungs: clear to auscultation, no accessory muscle use Psych: well oriented with normal range of affect and appropriate judgment/insight  Essential hypertension - Plan: amLODipine-olmesartan (AZOR) 5-20 MG tablet  Morbid obesity (HCC) - Plan: Semaglutide-Weight Management 0.25 MG/0.5ML SOAJ, Semaglutide-Weight Management 0.5 MG/0.5ML SOAJ, Semaglutide-Weight Management 1 MG/0.5ML SOAJ, Semaglutide-Weight Management 1.7 MG/0.75ML SOAJ, Semaglutide-Weight Management 2.4 MG/0.75ML SOAJ  Chronic, uncontrolled.  Stop losartan, start Azor 5-20 mg daily.  Monitor blood pressure at home.  Send me a message in 2 to 3 weeks of blood pressure readings.  Counseled on diet and exercise. Chronic, uncontrolled.  Start [employer].  Counseled on diet and exercise.  Follow-up in 6 weeks for this. The patient voiced understanding and agreement to the  plan.  Jilda Roche [place], DO 01/06/23  8:05 AM

## 2023-01-11 ENCOUNTER — Telehealth: Payer: Self-pay | Admitting: Family Medicine

## 2023-01-11 NOTE — Telephone Encounter (Signed)
Pt called and stated that per [facility], they sent a prior auth for his prescription for [facility] Please advise.

## 2023-01-17 ENCOUNTER — Telehealth: Payer: Self-pay

## 2023-01-17 NOTE — Telephone Encounter (Signed)
PA request has been Submitted. New Encounter created for follow up. For additional info see Pharmacy Prior Auth telephone encounter from 01/17/2023.

## 2023-01-17 NOTE — Telephone Encounter (Signed)
Pharmacy Patient Advocate Encounter   Received notification from Pt Calls Messages that prior authorization for [facility] is required/requested.   Insurance verification completed.   The patient is insured through [facility] .   Per test claim: PA required; PA submitted to [facility] via CoverMyMeds Key/confirmation #/EOC [facility] Status is pending

## 2023-01-20 NOTE — Telephone Encounter (Signed)
Pharmacy Patient Advocate Encounter  Received notification from [facility]  that Prior Authorization for [facility] has been APPROVED from 01/17/2023 to 07/18/2023   PA #/Case ID/Reference #: [id number]

## 2023-01-20 NOTE — Telephone Encounter (Signed)
Patient made aware of approval

## 2023-01-22 ENCOUNTER — Telehealth: Payer: BC Managed Care – PPO | Admitting: Physician Assistant

## 2023-01-22 DIAGNOSIS — U071 COVID-19: Secondary | ICD-10-CM

## 2023-01-22 MED ORDER — PROMETHAZINE-DM 6.25-15 MG/5ML PO SYRP: 5.0000 mL | ORAL_SOLUTION | Freq: Four times a day (QID) | ORAL | 0 refills | Status: DC | PRN

## 2023-01-22 MED ORDER — FLUTICASONE PROPIONATE 50 MCG/ACT NA SUSP: 2.0000 | Freq: Every day | NASAL | 6 refills | Status: DC

## 2023-01-22 NOTE — Progress Notes (Signed)
E-Visit  for Positive Covid Test Result   We are sorry you are not feeling well. We are here to help!  You have tested positive for COVID-19, meaning that you were infected with the novel coronavirus and could give the virus to others.  Most people with COVID-19 have mild illness and can recover at home without medical care. Do not leave your home, except to get medical care. Do not visit public areas and do not go to places where you are unable to wear a mask. It is important that you stay home  to take care for yourself and to help protect other people in your home and community.      Isolation Instructions:   You are to isolate at home until you have been fever free for at least 24 hours without a fever-reducing medication, and symptoms have been steadily improving for 24 hours. At that time,  you can end isolation but need to mask for an additional 5 days.  If you must be around other household members who do not have symptoms, you need to make sure that both you and the family members are masking consistently with a high-quality mask.  If you note any worsening of symptoms despite treatment, please seek an in-person evaluation ASAP. If you note any significant shortness of breath or any chest pain, please seek ER evaluation. Please do not delay care!   Go to the nearest hospital ED for assessment if fever/cough/breathlessness are severe or illness seems like a threat to life.    The following symptoms may appear 2-14 days after exposure: Fever Cough Shortness of breath or difficulty breathing Chills Repeated shaking with chills Muscle pain Headache Sore throat New loss of taste or smell Fatigue Congestion or runny nose Nausea or vomiting Diarrhea  You can use medication such as prescription cough medication called Phenergan DM 6.25 mg/15 mg. You make take one teaspoon / 5 ml every 4-6 hours as needed for cough and prescription for Fluticasone nasal spray 2 sprays in each  nostril one time per day  You may also take acetaminophen (Tylenol) as needed for fever.  To discuss antiviral medications please make a Virtual Visit appointment.  If symptoms are mild this medication is not recommended.   HOME CARE: Only take medications as instructed by your medical team. Drink plenty of fluids and get plenty of rest. A steam or ultrasonic humidifier can help if you have congestion.   GET HELP RIGHT AWAY IF YOU HAVE EMERGENCY WARNING SIGNS.  Call 911 or proceed to your closest emergency facility if: You develop worsening high fever. Trouble breathing Bluish lips or face Persistent pain or pressure in the chest New confusion Inability to wake or stay awake You cough up blood. Your symptoms become more severe Inability to hold down food or fluids  This list is not all possible symptoms. Contact your medical provider for any symptoms that are severe or concerning to you.   Your e-visit answers were reviewed by a board certified advanced clinical practitioner to complete your personal care plan.  Depending on the condition, your plan could have included both over the counter or prescription medications.  If there is a problem please reply once you have received a response from your provider.  Your safety is important to [place].  If you have drug allergies check your prescription carefully.    You can use MyChart to ask questions about today's visit, request a non-urgent call back, or ask for a work  or school excuse for 24 hours related to this e-Visit. If it has been greater than 24 hours you will need to follow up with your provider, or enter a new e-Visit to address those concerns. You will get an e-mail in the next two days asking about your experience.  I hope that your e-visit has been valuable and will speed your recovery. Thank you for using e-visits.  I have spent 5 minutes in review of e-visit questionnaire, review and updating patient chart, medical decision  making and response to patient.   Tylene Fantasia Ward, PA-C

## 2023-01-24 DIAGNOSIS — G4733 Obstructive sleep apnea (adult) (pediatric): Secondary | ICD-10-CM | POA: Diagnosis not present

## 2023-01-25 ENCOUNTER — Encounter: Payer: Self-pay | Admitting: Family Medicine

## 2023-01-25 ENCOUNTER — Other Ambulatory Visit (HOSPITAL_BASED_OUTPATIENT_CLINIC_OR_DEPARTMENT_OTHER): Payer: Self-pay

## 2023-01-25 ENCOUNTER — Other Ambulatory Visit: Payer: Self-pay | Admitting: Family Medicine

## 2023-01-25 DIAGNOSIS — I10 Essential (primary) hypertension: Secondary | ICD-10-CM

## 2023-01-25 MED ORDER — SEMAGLUTIDE-WEIGHT MANAGEMENT 1 MG/0.5ML ~~LOC~~ SOAJ
1.0000 mg | SUBCUTANEOUS | 0 refills | Status: DC
  Filled 2023-01-25 – 2023-03-14 (×3): qty 2, 28d supply, fill #0

## 2023-01-25 MED ORDER — SEMAGLUTIDE-WEIGHT MANAGEMENT 0.25 MG/0.5ML ~~LOC~~ SOAJ
0.2500 mg | SUBCUTANEOUS | 0 refills | Status: DC
  Filled 2023-01-25: qty 2, 28d supply, fill #0

## 2023-01-25 MED ORDER — SEMAGLUTIDE-WEIGHT MANAGEMENT 0.5 MG/0.5ML ~~LOC~~ SOAJ
0.5000 mg | SUBCUTANEOUS | 0 refills | Status: DC
  Filled 2023-01-25 – 2023-02-23 (×3): qty 2, 28d supply, fill #0

## 2023-01-25 MED ORDER — LOSARTAN POTASSIUM 100 MG PO TABS
100.0000 mg | ORAL_TABLET | Freq: Every day | ORAL | 3 refills | Status: DC
Start: 2023-01-25 — End: 2023-06-21

## 2023-01-25 MED ORDER — SEMAGLUTIDE-WEIGHT MANAGEMENT 1.7 MG/0.75ML ~~LOC~~ SOAJ
1.7000 mg | SUBCUTANEOUS | 0 refills | Status: DC
  Filled 2023-01-25: qty 3, 28d supply, fill #0

## 2023-01-25 MED ORDER — SEMAGLUTIDE-WEIGHT MANAGEMENT 2.4 MG/0.75ML ~~LOC~~ SOAJ
2.4000 mg | SUBCUTANEOUS | 0 refills | Status: DC
  Filled 2023-01-25: qty 3, 28d supply, fill #0

## 2023-02-06 ENCOUNTER — Encounter (INDEPENDENT_AMBULATORY_CARE_PROVIDER_SITE_OTHER): Payer: Self-pay

## 2023-02-13 ENCOUNTER — Telehealth: Payer: 59 | Admitting: Physician Assistant

## 2023-02-13 DIAGNOSIS — A084 Viral intestinal infection, unspecified: Secondary | ICD-10-CM | POA: Diagnosis not present

## 2023-02-13 MED ORDER — LOPERAMIDE HCL 2 MG PO TABS
2.0000 mg | ORAL_TABLET | Freq: Four times a day (QID) | ORAL | 0 refills | Status: DC | PRN
Start: 2023-02-13 — End: 2023-02-22

## 2023-02-13 MED ORDER — ONDANSETRON 4 MG PO TBDP
4.0000 mg | ORAL_TABLET | Freq: Three times a day (TID) | ORAL | 0 refills | Status: DC | PRN
Start: 2023-02-13 — End: 2023-02-22

## 2023-02-13 NOTE — Progress Notes (Signed)
Virtual Visit Consent   Brendan Gregory, you are scheduled for a virtual visit with a [facility] provider today. Just as with appointments in the office, your consent must be obtained to participate. Your consent will be active for this visit and any virtual visit you may have with one of our providers in the next 365 days. If you have a MyChart account, a copy of this consent can be sent to you electronically.  As this is a virtual visit, video technology does not allow for your provider to perform a traditional examination. This may limit your provider's ability to fully assess your condition. If your provider identifies any concerns that need to be evaluated in person or the need to arrange testing (such as labs, EKG, etc.), we will make arrangements to do so. Although advances in technology are sophisticated, we cannot ensure that it will always work on either your end or our end. If the connection with a video visit is poor, the visit may have to be switched to a telephone visit. With either a video or telephone visit, we are not always able to ensure that we have a secure connection.  By engaging in this virtual visit, you consent to the provision of healthcare and authorize for your insurance to be billed (if applicable) for the services provided during this visit. Depending on your insurance coverage, you may receive a charge related to this service.  I need to obtain your verbal consent now. Are you willing to proceed with your visit today? [name] has provided verbal consent on 02/13/2023 for a virtual visit (video or telephone). [name], PA-C  Date: 02/13/2023 9:25 AM  Virtual Visit via Video Note   I, [name], connected with  [name]  ([id number], [birth year]) on 02/13/23 at  9:15 AM EST by a video-enabled telemedicine application and verified that I am speaking with the correct person using two identifiers.  Location: Patient: Virtual Visit Location  Patient: Home Provider: Virtual Visit Location Provider: Home Office   I discussed the limitations of evaluation and management by telemedicine and the availability of in person appointments. The patient expressed understanding and agreed to proceed.    History of Present Illness: Brendan Gregory is a 42 y.o. who identifies as a male who was assigned male at birth, and is being seen today for nausea, vomiting, diarrhea.  HPI: Emesis  This is a new problem. The current episode started yesterday. The problem occurs 2 to 4 times per day. The problem has been gradually worsening. The emesis has an appearance of stomach contents. There has been no fever. Associated symptoms include chills (mild last night), coughing, diarrhea, dizziness (feels swimmy headed), headaches and myalgias. Pertinent negatives include no abdominal pain, chest pain, fever, sweats, URI or weight loss. Associated symptoms comments: Brain fog, nausea, bloating. Risk factors include suspect food intake (possible recently consumed [place] food). He has tried increased fluids for the symptoms. The treatment provided no relief.     Problems:  Patient Active Problem List   Diagnosis Date Noted   Hypokalemia 03/14/2022   Acute cough 03/08/2022   Pansinusitis 03/08/2022   Weakness 03/08/2022   Panic attack 10/25/2021   Generalized anxiety disorder 10/25/2021   Solitary pulmonary nodule 05/04/2021   Agatston coronary artery calcium score less than 100 01/25/2021   Morbid obesity (HCC) 11/24/2020   Obstructive sleep apnea on CPAP 05/16/2019   Essential hypertension 05/16/2019    Allergies:  Allergies  Allergen Reactions  Amlodipine Nausea Only    HA and nausea   Medications:  Current Outpatient Medications:    loperamide (IMODIUM A-D) 2 MG tablet, Take 1 tablet (2 mg total) by mouth 4 (four) times daily as needed for diarrhea or loose stools., Disp: 30 tablet, Rfl: 0   ondansetron (ZOFRAN-ODT) 4 MG disintegrating tablet,  Take 1 tablet (4 mg total) by mouth every 8 (eight) hours as needed., Disp: 20 tablet, Rfl: 0   atorvastatin (LIPITOR) 10 MG tablet, TAKE 1 TABLET BY MOUTH EVERY DAY, Disp: 90 tablet, Rfl: 3   Cholecalciferol (VITAMIN D3 PO), Take 5,000 Units by mouth daily., Disp: , Rfl:    fluticasone (FLONASE) 50 MCG/ACT nasal spray, Place 2 sprays into both nostrils daily., Disp: 16 g, Rfl: 6   folic acid (FOLVITE) 800 MCG tablet, Take 400 mcg by mouth daily., Disp: , Rfl:    Garlic (GARLIQUE) 400 MG TBEC, Take by mouth., Disp: , Rfl:    losartan (COZAAR) 100 MG tablet, Take 1 tablet (100 mg total) by mouth daily., Disp: 90 tablet, Rfl: 3   Omega-3 Fatty Acids (FISH OIL) 1000 MG CAPS, Take by mouth daily., Disp: , Rfl:    OVER THE COUNTER MEDICATION, Take 1 capsule by mouth daily at 6 (six) AM. Aged beet root, Disp: , Rfl:    promethazine-dextromethorphan (PROMETHAZINE-DM) 6.25-15 MG/5ML syrup, Take 5 mLs by mouth 4 (four) times daily as needed for cough., Disp: 118 mL, Rfl: 0   Semaglutide-Weight Management 0.25 MG/0.5ML SOAJ, Inject 0.25 mg into the skin once a week for 28 days., Disp: 2 mL, Rfl: 0   [START ON 02/23/2023] Semaglutide-Weight Management 0.5 MG/0.5ML SOAJ, Inject 0.5 mg into the skin once a week for 28 days., Disp: 2 mL, Rfl: 0   [START ON 03/24/2023] Semaglutide-Weight Management 1 MG/0.5ML SOAJ, Inject 1 mg into the skin once a week for 28 days., Disp: 2 mL, Rfl: 0   [START ON 04/22/2023] Semaglutide-Weight Management 1.7 MG/0.75ML SOAJ, Inject 1.7 mg into the skin once a week for 28 days., Disp: 3 mL, Rfl: 0   [START ON 05/21/2023] Semaglutide-Weight Management 2.4 MG/0.75ML SOAJ, Inject 2.4 mg into the skin once a week for 28 days., Disp: 3 mL, Rfl: 0  Observations/Objective: Patient is well-developed, well-nourished in no acute distress.  Resting comfortably at home.  Head is normocephalic, atraumatic.  No labored breathing.  Speech is clear and coherent with logical content.  Patient is  alert and oriented at baseline.    Assessment and Plan: 1. Viral gastroenteritis - ondansetron (ZOFRAN-ODT) 4 MG disintegrating tablet; Take 1 tablet (4 mg total) by mouth every 8 (eight) hours as needed.  Dispense: 20 tablet; Refill: 0 - loperamide (IMODIUM A-D) 2 MG tablet; Take 1 tablet (2 mg total) by mouth 4 (four) times daily as needed for diarrhea or loose stools.  Dispense: 30 tablet; Refill: 0  - Suspect viral gastroenteritis - Zofran for nausea - Imodium for diarrhea - Push fluids, electrolyte beverages - Liquid diet, then increase to soft/bland (BRAT) diet over next day, then increase diet as tolerated - Seek in person evaluation if not improving or symptoms worsen   Follow Up Instructions: I discussed the assessment and treatment plan with the patient. The patient was provided an opportunity to ask questions and all were answered. The patient agreed with the plan and demonstrated an understanding of the instructions.  A copy of instructions were sent to the patient via MyChart unless otherwise noted below.    The  patient was advised to call back or seek an in-person evaluation if the symptoms worsen or if the condition fails to improve as anticipated.    [name], PA-C

## 2023-02-13 NOTE — Patient Instructions (Signed)
Brendan Gregory, thank you for joining Brendan Loveless, PA-C for today's virtual visit.  While this provider is not your primary care provider (PCP), if your PCP is located in our provider database this encounter information will be shared with them immediately following your visit.   A [name] account gives you access to today's visit and all your visits, tests, and labs performed at [facility] " click here if you don't have a [facility] MyChart account or go to [email or web address]  Consent: (Patient) Brendan Gregory provided verbal consent for this virtual visit at the beginning of the encounter.  Current Medications:  Current Outpatient Medications:    loperamide (IMODIUM A-D) 2 MG tablet, Take 1 tablet (2 mg total) by mouth 4 (four) times daily as needed for diarrhea or loose stools., Disp: 30 tablet, Rfl: 0   ondansetron (ZOFRAN-ODT) 4 MG disintegrating tablet, Take 1 tablet (4 mg total) by mouth every 8 (eight) hours as needed., Disp: 20 tablet, Rfl: 0   atorvastatin (LIPITOR) 10 MG tablet, TAKE 1 TABLET BY MOUTH EVERY DAY, Disp: 90 tablet, Rfl: 3   Cholecalciferol (VITAMIN D3 PO), Take 5,000 Units by mouth daily., Disp: , Rfl:    fluticasone (FLONASE) 50 MCG/ACT nasal spray, Place 2 sprays into both nostrils daily., Disp: 16 g, Rfl: 6   folic acid (FOLVITE) 800 MCG tablet, Take 400 mcg by mouth daily., Disp: , Rfl:    Garlic (GARLIQUE) 400 MG TBEC, Take by mouth., Disp: , Rfl:    losartan (COZAAR) 100 MG tablet, Take 1 tablet (100 mg total) by mouth daily., Disp: 90 tablet, Rfl: 3   Omega-3 Fatty Acids (FISH OIL) 1000 MG CAPS, Take by mouth daily., Disp: , Rfl:    OVER THE COUNTER MEDICATION, Take 1 capsule by mouth daily at 6 (six) AM. Aged beet root, Disp: , Rfl:    promethazine-dextromethorphan (PROMETHAZINE-DM) 6.25-15 MG/5ML syrup, Take 5 mLs by mouth 4 (four) times daily as needed for cough., Disp: 118 mL, Rfl: 0   Semaglutide-Weight Management 0.25  MG/0.5ML SOAJ, Inject 0.25 mg into the skin once a week for 28 days., Disp: 2 mL, Rfl: 0   [START ON 02/23/2023] Semaglutide-Weight Management 0.5 MG/0.5ML SOAJ, Inject 0.5 mg into the skin once a week for 28 days., Disp: 2 mL, Rfl: 0   [START ON 03/24/2023] Semaglutide-Weight Management 1 MG/0.5ML SOAJ, Inject 1 mg into the skin once a week for 28 days., Disp: 2 mL, Rfl: 0   [START ON 04/22/2023] Semaglutide-Weight Management 1.7 MG/0.75ML SOAJ, Inject 1.7 mg into the skin once a week for 28 days., Disp: 3 mL, Rfl: 0   [START ON 05/21/2023] Semaglutide-Weight Management 2.4 MG/0.75ML SOAJ, Inject 2.4 mg into the skin once a week for 28 days., Disp: 3 mL, Rfl: 0   Medications ordered in this encounter:  Meds ordered this encounter  Medications   ondansetron (ZOFRAN-ODT) 4 MG disintegrating tablet    Sig: Take 1 tablet (4 mg total) by mouth every 8 (eight) hours as needed.    Dispense:  20 tablet    Refill:  0    Order Specific Question:   Supervising Provider    Answer:   Merrilee Jansky [id number]   loperamide (IMODIUM A-D) 2 MG tablet    Sig: Take 1 tablet (2 mg total) by mouth 4 (four) times daily as needed for diarrhea or loose stools.    Dispense:  30 tablet    Refill:  0    Order Specific  Question:   Supervising Provider    Answer:   Merrilee Jansky [[id number]]     *If you need refills on other medications prior to your next appointment, please contact your pharmacy*  Follow-Up: Call back or seek an in-person evaluation if the symptoms worsen or if the condition fails to improve as anticipated.  [facility] [phone]  Other Instructions [name] Diet A bland diet may consist of soft foods or foods that are not high in fat or are not greasy, acidic, or spicy. Avoiding certain foods may cause less irritation to your mouth, throat, stomach, or gastrointestinal tract. Avoiding certain foods may make you feel better. Everyone's tolerances are different. A bland diet  should be based on what you can tolerate and what may cause discomfort. What is my plan? Your health care provider or dietitian may recommend specific changes to your diet to treat your symptoms. These changes may include: Eating small meals frequently. Cooking food until it is soft enough to chew easily. Taking the time to chew your food thoroughly, so it is easy to swallow and digest. Avoiding foods that cause you discomfort. These may include spicy food, fried food, greasy foods, hard-to-chew foods, or citrus fruits and juices. Drinking slowly. What are tips for following this plan? Reading food labels To reduce fiber intake, look for food labels that say "whole," such as whole wheat or whole grain. Shopping Avoid food items that may have nuts or seeds. Avoid vegetables that may make you gassy or have a tough texture, such as broccoli, cauliflower, or corn. Cooking Cook foods thoroughly so they have a soft texture. Meal planning Make sure you include foods from all food groups to eat a balanced diet. Eat a variety of types of foods. Eat foods and drink beverages that do not cause you discomfort. These may include soups and broths with cooked meats, pasta, and vegetables. Lifestyle Sit up after meals, avoid tight clothing, and take time to eat and chew your food slowly. Ask your health care provider whether you should take dietary supplements. General information Mildly season your foods. Some seasonings, such as cayenne pepper, vinegar, or hot sauce, may cause irritation. The foods, beverages, or seasonings to avoid should be based on individual tolerance. What foods should I eat? Fruits Canned or cooked fruit such as peaches, pears, or applesauce. Bananas. Vegetables Well-cooked vegetables. Canned or cooked vegetables such as carrots, green beans, beets, or spinach. Mashed or boiled potatoes. Grains  Hot cereals, such as cream of wheat and processed oatmeal. Rice. Bread,  crackers, pasta, or tortillas made from refined white flour. Meats and other proteins  Eggs. Creamy peanut butter or other nut butters. Lean, well-cooked tender meats, such as beef, pork, chicken, or fish. Dairy Low-fat dairy products such as milk, cottage cheese, or yogurt. Beverages  Water. Herbal tea. Apple juice. Fats and oils Mild salad dressings. Canola or olive oil. Sweets and desserts Low-fat pudding, custard, or ice cream. Fruit gelatin. The items listed above may not be a complete list of foods and beverages you can eat. Contact a dietitian for more information. What foods should I avoid? Fruits Citrus fruits, such as oranges and grapefruit. Fruits with a stringy texture. Fruits that have lots of seeds, such as kiwi or strawberries. Dried fruits. Vegetables Raw, uncooked vegetables. Salads. Grains Whole grain breads, muffins, and cereals. Meats and other proteins Tough, fibrous meats. Highly seasoned meat such as corned beef, smoked meats, or fish. Processed high-fat meats such as  brats, hot dogs, or sausage. Dairy Full-fat dairy foods such as ice cream and cheese. Beverages Caffeinated drinks. Alcohol. Seasonings and condiments Strongly flavored seasonings or condiments. Hot sauce. Salsa. Other foods Spicy foods. Fried or greasy foods. Sour foods, such as pickled or fermented foods like sauerkraut. Foods high in fiber. The items listed above may not be a complete list of foods and beverages you should avoid. Contact a dietitian for more information. Summary A bland diet should be based on individual tolerance. It may consist of foods that are soft textured and do not have a lot of fat, fiber, acid, or seasonings. A bland diet may be recommended because avoiding certain foods, beverages, or spices may make you feel better. This information is not intended to replace advice given to you by your health care provider. Make sure you discuss any questions you have with your  health care provider. Document Revised: 02/08/2021 Document Reviewed: 02/08/2021 Elsevier Patient Education  2024 Elsevier [employer]    If you have been instructed to have an in-person evaluation today at a local Urgent Care facility, please use the link below. It will take you to a list of all of our available [facility], including address, phone number and hours of operation. Please do not delay care.  [facility]  If you or a family member do not have a primary care provider, use the link below to schedule a visit and establish care. When you choose a [facility] primary care physician or advanced practice provider, you gain a long-term partner in health. Find a Primary Care Provider  Learn more about [facility]'s in-office and virtual care options: [facility] - Get Care Now

## 2023-02-22 ENCOUNTER — Other Ambulatory Visit: Payer: Self-pay

## 2023-02-22 ENCOUNTER — Other Ambulatory Visit (HOSPITAL_BASED_OUTPATIENT_CLINIC_OR_DEPARTMENT_OTHER): Payer: Self-pay

## 2023-02-22 ENCOUNTER — Ambulatory Visit: Payer: 59 | Admitting: Family Medicine

## 2023-02-22 DIAGNOSIS — M25561 Pain in right knee: Secondary | ICD-10-CM | POA: Diagnosis not present

## 2023-02-22 DIAGNOSIS — G8929 Other chronic pain: Secondary | ICD-10-CM

## 2023-02-22 NOTE — Patient Instructions (Signed)
Keep the diet clean and stay active.  Strong work with your weight loss.   Ibuprofen 400-600 mg (2-3 over the counter strength tabs) every 6 hours as needed for pain.  OK to take Tylenol 1000 mg (2 extra strength tabs) or 975 mg (3 regular strength tabs) every 6 hours as needed.  Ice/cold pack over area for 10-15 min twice daily.  Let us know if you need anything.  Stretching and range of motion exercises These exercises warm up your muscles and joints and improve the movement and flexibility of your knee. These exercises also help to relieve pain and stiffness.  Exercise A: Knee flexion, active Lie on your back with both knees straight. If this causes back discomfort, bend your uninjured knee so your foot is flat on the floor. Slowly slide your left / right heel back toward your buttocks until you feel a gentle stretch in the front of your knee or thigh. Stop if you have pain. Hold for3 seconds. Slowly slide your left / right heel back to the starting position. 10 total repetitions. Repeat 2 times. Complete this exercise 3 times a week.  Exercise B: Knee extension, sitting Sit with your left / right heel propped on a chair, a coffee table, or a footstool. Do not have anything under your knee to support it. Allow your leg muscles to relax, letting gravity straighten out your knee. You should feel a stretch behind your left / right knee. If told by your health care provide just above your kneecap. Hold this position for 3 seconds. Repeat for a total of 10 repetitions. Repeat 2 times. Complete this stretch 3 times a week.  Strengthening exercises These exercises build strength and endurance in your knee. Endurance is the ability to use your muscles for a long time, even after they get tired.  Exercise C: Quadriceps, isometric Lie on your back with your left / right leg extended and your other knee bent. Put a rolled towel or small pillow under your right/left knee if told by your  health care provider. Slowly tense the muscles in the front of your left / right thigh by pushing the back of your knee down. You should see your knee cap slide up toward your hip or see increased dimpling just above the knee. For 3 seconds, keep the muscle as tight as you can without increasing your pain. Relax the muscles slowly and completely. Repeat for 10 total repetitions. Repeat 2 times. Complete this exercise 3 times a week. Exercise D: Straight leg raises (quadriceps) Lie on your back with your left / right leg extended and your other knee bent. Tense the muscles in the front of your left / right thigh. You should see your kneecap slide up or see increased dimpling just above the knee. Keep these muscles tight as you raise your leg 4-6 inches (10-15 cm) off the floor. Hold this position for 3 seconds. Keep these muscles tense as you lower your leg. Relax the muscles slowly and completely. Repeat for a total of 10 repetitions. Repeat 2 times. Complete this exercise 3 times a week.  Exercise E: Hamstring curls On the floor or a bed, lie on your abdomen with your legs straight. Put a folded towel or small pillow under your left / right thigh, just above your kneecap. Slowly bend your left / right knee as far as you can without pain. Keep your hips flat against the floor or bed. Hold this position for 3 seconds. Slowly lower  your leg to the starting position. Repeat for a total of 10 repetitions. Repeat 2 times. Complete this exercise 3 times per week.  Stretching exercises These exercises warm up your muscles and joints and improve the movement and flexibility of your knee. These exercises also help to relieve pain and stiffness.  Exercise A: Quadriceps, prone Lie on your abdomen on a firm surface, such as a bed or padded floor. Bend your left / right knee and hold your ankle. If you cannot reach your ankle or pant leg, loop a belt around your foot and grab the belt instead. Gently  pull your heel toward your buttocks. Your knee should not slide out to the side. You should feel a stretch in the front of your thigh and knee. Hold this position for 30 seconds. Repeat 2 times. Complete this stretch 3 times a week.  Exercise B: Hamstring, doorway Lie on your back in front of a doorway with your left / right leg resting against the wall and your other leg flat on the floor in the doorway. There should be a slight bend in your left / right knee. Straighten your left / right knee. You should feel a stretch behind your knee or thigh. If you do not feel that stretch, scoot your buttocks closer to the door. Hold this position for 30 seconds. Repeat 2 times. Complete this stretch 3 times a week.  Strengthening exercises These exercises build strength and endurance in your knee and leg muscles. Endurance is the ability to use your muscles for a long time, even after they get tired.   Exercise D: Wall slides (quadriceps) Lean your back against a smooth wall or door, and walk your feet out 18-24 inches (45-61 cm) from it. Place your feet hip-width apart. Slowly slide down the wall or door until your knees bend 90 degrees. Keep your knees over your heels, not over your toes. Keep your knees in line with your hips. Hold for 2 seconds. Stand up to rest for 60 seconds. Repeat 2 times. Complete this exercise 3 times a week.  Exercise E: Bridge (hip extensors) Lie on your back on a firm surface with your knees bent and your feet flat on the floor. Tighten your buttocks muscles and lift your bottom off the floor until your trunk is level with your thighs. Do not arch your back. You should feel the muscles working in your buttocks and the back of your thighs. Hold this position for 2 seconds. Slowly lower your hips to the starting position. Let your buttocks muscles relax completely between repetitions. Repeat 2 times. Complete this exercise 3 times a week.

## 2023-02-22 NOTE — Progress Notes (Signed)
Chief Complaint  Patient presents with   Follow-up    Follow up    Subjective: Patient is a 42 y.o. male here for f/u obesity.  Started on Wegovy. He is currently on 0.25 mg week. He reports compliance, no AE's. Lost around 10 lbs. Appetite is decreased, fewer cravings. Doing elliptical machine for exercise.   R knee pain Feels popping/catching when he squats down, kneels, r goes up stairs. Does not get stuck. No bruising, redness, swelling. No inj or change in activity. Has been going on for 2-3 mo. Overall, no significant improvement. OK ROM.   Past Medical History:  Diagnosis Date   Agatston coronary artery calcium score less than 100    Ca score 1.  No CAD on coronary CTA   Hypertension     Objective: BP 130/78 (BP Location: Left Arm, Patient Position: Sitting, Cuff Size: Normal)   Pulse 97   Temp 98 F (36.7 C) (Oral)   Resp 16   Ht 5\' 11"  (1.803 m)   Wt 290 lb 3.2 oz (131.6 kg)   SpO2 98%   BMI 40.47 kg/m  General: Awake, appears stated age Heart: RRR, no LE edema Lungs: CTAB, no rales, wheezes or rhonchi. No accessory muscle use MSK: TTP over the medial plica, no joint line tenderness or deformity, no edema or effusion; negative Lachman's, varus/valgus stress, patellar apprehension/grind, McMurray's, normal active and passive range of motion Psych: Age appropriate judgment and insight, normal affect and mood  Assessment and Plan: Morbid obesity (HCC)  Chronic pain of right knee  Chronic, improving.  Continue titrating up [facility] dosage, will start 0.5 mg weekly dose resume.  Counseled on diet and exercise. Chronic, not fully controlled.  Heat, ice, Tylenol, stretches and exercises.  Could consider injection versus physical therapy if no improvement. Follow-up in 6 months for physical or as needed. The patient voiced understanding and agreement to the plan.  Jilda Roche [place], DO 02/22/23  10:50 AM

## 2023-02-23 ENCOUNTER — Encounter: Payer: Self-pay | Admitting: Family Medicine

## 2023-02-23 ENCOUNTER — Other Ambulatory Visit (HOSPITAL_BASED_OUTPATIENT_CLINIC_OR_DEPARTMENT_OTHER): Payer: Self-pay

## 2023-02-23 ENCOUNTER — Other Ambulatory Visit: Payer: Self-pay

## 2023-02-23 DIAGNOSIS — M25569 Pain in unspecified knee: Secondary | ICD-10-CM

## 2023-02-23 DIAGNOSIS — M25561 Pain in right knee: Secondary | ICD-10-CM

## 2023-02-24 DIAGNOSIS — G4733 Obstructive sleep apnea (adult) (pediatric): Secondary | ICD-10-CM | POA: Diagnosis not present

## 2023-02-27 ENCOUNTER — Other Ambulatory Visit (HOSPITAL_BASED_OUTPATIENT_CLINIC_OR_DEPARTMENT_OTHER): Payer: Self-pay

## 2023-03-06 NOTE — Therapy (Incomplete)
OUTPATIENT PHYSICAL THERAPY LOWER EXTREMITY EVALUATION   Patient Name: Brendan Gregory MRN: [id number] DOB:May 24, 1980, 42 y.o., male Today's Date: 03/06/2023  END OF SESSION:   Past Medical History:  Diagnosis Date   Agatston coronary artery calcium score less than 100    Ca score 1.  No CAD on coronary CTA   Hypertension    Past Surgical History:  Procedure Laterality Date   MOLE REMOVAL     outpatient procedure   testicular     for undescenced testicle   Patient Active Problem List   Diagnosis Date Noted   Hypokalemia 03/14/2022   Acute cough 03/08/2022   Pansinusitis 03/08/2022   Weakness 03/08/2022   Panic attack 10/25/2021   Generalized anxiety disorder 10/25/2021   Solitary pulmonary nodule 05/04/2021   Agatston coronary artery calcium score less than 100 01/25/2021   Morbid obesity (HCC) 11/24/2020   Obstructive sleep apnea on CPAP 05/16/2019   Essential hypertension 05/16/2019    PCP: Sharlene Dory, DO   REFERRING PROVIDER: Sharlene Dory*   REFERRING DIAG: [phone] (ICD-10-CM) - Chronic pain of right knee  THERAPY DIAG:  No diagnosis found.  Rationale for Evaluation and Treatment: Rehabilitation  ONSET DATE: ***  SUBJECTIVE:   SUBJECTIVE STATEMENT: ***  PERTINENT HISTORY: *** PAIN:  Are you having pain? {OPRCPAIN:27236}  PRECAUTIONS: {Therapy precautions:24002}  RED FLAGS: {PT Red Flags:29287}   WEIGHT BEARING RESTRICTIONS: {Yes ***/No:24003}  FALLS:  Has patient fallen in last 6 months? {fallsyesno:27318}  LIVING ENVIRONMENT: Lives with: {OPRC lives with:25569::"lives with their family"} Lives in: {Lives in:25570} Stairs: {opstairs:27293} Has following equipment at home: {Assistive devices:23999}  OCCUPATION: ***  PLOF: {PLOF:24004}  PATIENT GOALS: ***  NEXT MD VISIT: ***  OBJECTIVE:  Note: Objective measures were completed at Evaluation unless otherwise noted.  DIAGNOSTIC FINDINGS:  ***  PATIENT SURVEYS:  LEFS ***  COGNITION: Overall cognitive status: Within functional limits for tasks assessed     SENSATION: {sensation:27233}  EDEMA:  {edema:24020}  MUSCLE LENGTH: Hamstrings: Right *** deg; Left *** deg Maisie Fus test: Right *** deg; Left *** deg  POSTURE: {posture:25561}  PALPATION: ***  LOWER EXTREMITY ROM:  {AROM/PROM:27142} ROM Right eval Left eval  Hip flexion    Hip extension    Hip abduction    Hip adduction    Hip internal rotation    Hip external rotation    Knee flexion    Knee extension    Ankle dorsiflexion    Ankle plantarflexion    Ankle inversion    Ankle eversion     (Blank rows = not tested)  LOWER EXTREMITY MMT:  MMT Right eval Left eval  Hip flexion    Hip extension    Hip abduction    Hip adduction    Hip internal rotation    Hip external rotation    Knee flexion    Knee extension    Ankle dorsiflexion    Ankle plantarflexion    Ankle inversion    Ankle eversion     (Blank rows = not tested)  LOWER EXTREMITY SPECIAL TESTS:  {LEspecialtests:26242}  FUNCTIONAL TESTS:  {Functional tests:24029}  GAIT: Distance walked: *** Assistive device utilized: {Assistive devices:23999} Level of assistance: {Levels of assistance:24026} Comments: ***   TODAY'S TREATMENT:  DATE: ***    PATIENT EDUCATION:  Education details: *** Person educated: {Person educated:25204} Education method: {Education Method:25205} Education comprehension: {Education Comprehension:25206}  HOME EXERCISE PROGRAM: ***  ASSESSMENT:  CLINICAL IMPRESSION: Brendan Gregory is a 42 y.o. male  who was seen today for physical therapy evaluation and treatment for chronic R knee pain.  ***   OBJECTIVE IMPAIRMENTS: {opptimpairments:25111}.   ACTIVITY LIMITATIONS: {activitylimitations:27494}  PARTICIPATION  LIMITATIONS: {participationrestrictions:25113}  PERSONAL FACTORS: {Personal factors:25162} are also affecting patient's functional outcome.   REHAB POTENTIAL: {rehabpotential:25112}  CLINICAL DECISION MAKING: {clinical decision making:25114}  EVALUATION COMPLEXITY: {Evaluation complexity:25115}   GOALS: Goals reviewed with patient? {yes/no:20286}  SHORT TERM GOALS: Target date: {follow up:25551}   Patient will be independent with initial HEP. Baseline: *** Goal status: {GOALSTATUS:25110}  2.  *** Baseline: *** Goal status: {GOALSTATUS:25110}  3.  *** Baseline: *** Goal status: {GOALSTATUS:25110}   LONG TERM GOALS: Target date: {follow up:25551}   Patient will be independent with advanced/ongoing HEP to improve outcomes and carryover.  Baseline: *** Goal status: {GOALSTATUS:25110}  2.  Patient will report at least 75% improvement in *** knee pain to improve QOL. Baseline: *** Goal status: {GOALSTATUS:25110}  3.  Patient will demonstrate improved *** knee AROM to >/= ***-*** deg to allow for normal gait and stair mechanics. Baseline: *** Goal status: {GOALSTATUS:25110}  4.  Patient will demonstrate improved functional LE strength as demonstrated by ***. Baseline: *** Goal status: {GOALSTATUS:25110}  5.  Patient will be able to ambulate 600' with LRAD and normal gait pattern without increased pain to access community.  Baseline: *** Goal status: {GOALSTATUS:25110}  6. Patient will be able to ascend/descend stairs with 1 HR and reciprocal step pattern safely to access home and community.  Baseline: *** Goal status: {GOALSTATUS:25110}  7.  Patient will report *** on *** (patient reported outcome measure) to demonstrate improved functional ability. Baseline: *** Goal status: {GOALSTATUS:25110}  8.  Patient will demonstrate at least 19/24 on DGI to decrease risk of falls. Baseline: *** Goal status: {GOALSTATUS:25110}   9.  *** Baseline: *** Goal status:  {GOALSTATUS:25110}    PLAN:  PT FREQUENCY: {rehab frequency:25116}  PT DURATION: {rehab duration:25117}  PLANNED INTERVENTIONS: {rehab planned interventions:25118::"97110-Therapeutic exercises","97530- Therapeutic [phone]- Neuromuscular re-education","97535- Self FAOZ","30865- Manual therapy"}  PLAN FOR NEXT SESSION: ***   Jena Gauss, PT 03/06/2023, 5:19 PM

## 2023-03-07 ENCOUNTER — Ambulatory Visit: Payer: 59 | Admitting: Physical Therapy

## 2023-03-13 ENCOUNTER — Other Ambulatory Visit (HOSPITAL_COMMUNITY): Payer: Self-pay

## 2023-03-13 ENCOUNTER — Other Ambulatory Visit (HOSPITAL_BASED_OUTPATIENT_CLINIC_OR_DEPARTMENT_OTHER): Payer: Self-pay

## 2023-03-14 ENCOUNTER — Other Ambulatory Visit (HOSPITAL_BASED_OUTPATIENT_CLINIC_OR_DEPARTMENT_OTHER): Payer: Self-pay

## 2023-03-14 ENCOUNTER — Telehealth: Payer: Self-pay

## 2023-03-14 ENCOUNTER — Encounter (HOSPITAL_BASED_OUTPATIENT_CLINIC_OR_DEPARTMENT_OTHER): Payer: Self-pay

## 2023-03-14 NOTE — Telephone Encounter (Signed)
PA approved.    Request Reference Number: [id number]. WEGOVY INJ 1MG  is approved through 09/12/2023. Your patient may now fill this prescription and it will be covered.. Authorization Expiration Date: September 12, 2023.

## 2023-03-14 NOTE — Telephone Encounter (Signed)
PA initiated via Covermymeds; KEY: [id number]. Awaiting determination.

## 2023-03-15 ENCOUNTER — Other Ambulatory Visit (HOSPITAL_BASED_OUTPATIENT_CLINIC_OR_DEPARTMENT_OTHER): Payer: Self-pay

## 2023-03-16 ENCOUNTER — Other Ambulatory Visit (HOSPITAL_BASED_OUTPATIENT_CLINIC_OR_DEPARTMENT_OTHER): Payer: Self-pay

## 2023-03-17 ENCOUNTER — Other Ambulatory Visit (HOSPITAL_BASED_OUTPATIENT_CLINIC_OR_DEPARTMENT_OTHER): Payer: Self-pay

## 2023-03-20 ENCOUNTER — Other Ambulatory Visit (HOSPITAL_BASED_OUTPATIENT_CLINIC_OR_DEPARTMENT_OTHER): Payer: Self-pay

## 2023-03-21 ENCOUNTER — Other Ambulatory Visit (HOSPITAL_BASED_OUTPATIENT_CLINIC_OR_DEPARTMENT_OTHER): Payer: Self-pay

## 2023-03-23 ENCOUNTER — Other Ambulatory Visit (HOSPITAL_BASED_OUTPATIENT_CLINIC_OR_DEPARTMENT_OTHER): Payer: Self-pay

## 2023-03-24 ENCOUNTER — Other Ambulatory Visit: Payer: Self-pay | Admitting: Family Medicine

## 2023-03-24 ENCOUNTER — Encounter: Payer: Self-pay | Admitting: Family Medicine

## 2023-03-24 ENCOUNTER — Other Ambulatory Visit (HOSPITAL_BASED_OUTPATIENT_CLINIC_OR_DEPARTMENT_OTHER): Payer: Self-pay

## 2023-03-24 MED ORDER — WEGOVY 0.25 MG/0.5ML ~~LOC~~ SOAJ
0.2500 mg | SUBCUTANEOUS | 5 refills | Status: DC
  Filled 2023-03-24 – 2023-04-04 (×2): qty 2, 28d supply, fill #0
  Filled 2023-04-20 – 2023-05-01 (×2): qty 2, 28d supply, fill #1

## 2023-03-25 IMAGING — DX DG KNEE COMPLETE 4+V*L*
4 series · 4 of 4 positions shown · non-contrast
Comparison: None Available.

CLINICAL DATA: Left knee pain.

EXAM:
LEFT KNEE - COMPLETE 4+ VIEW

[knee ap]
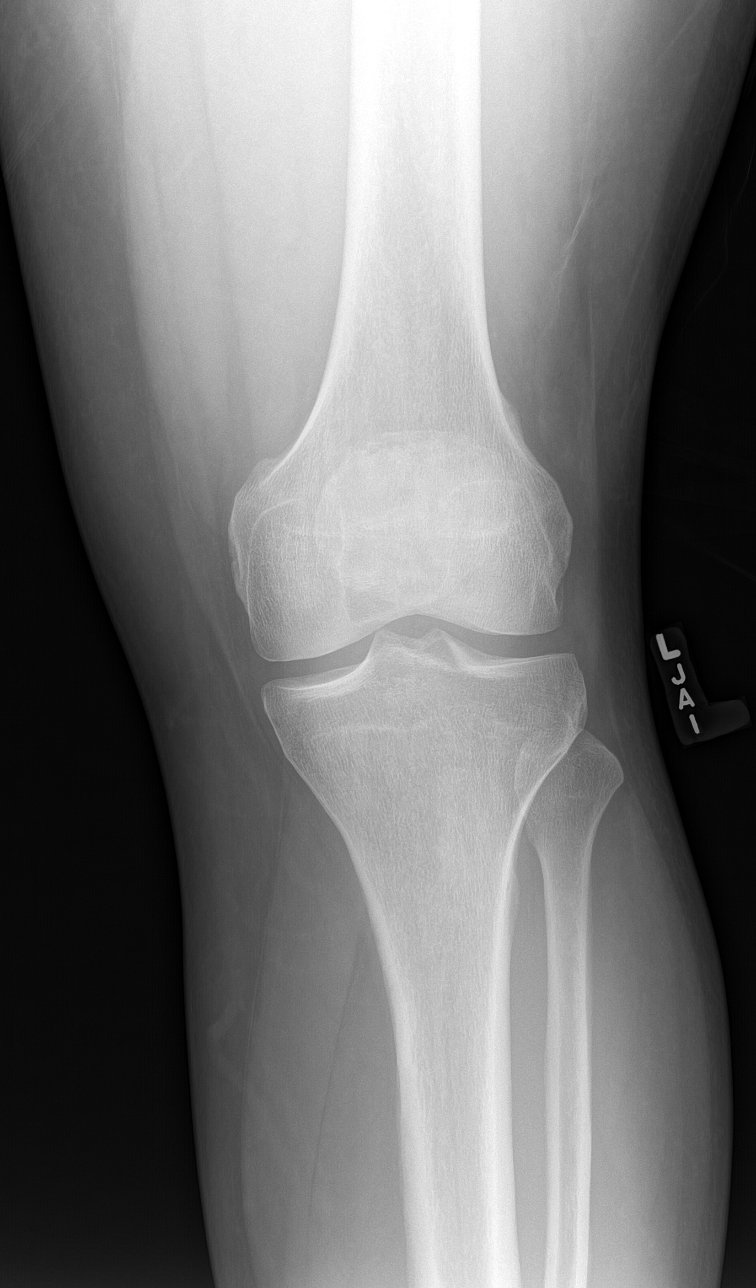

[knee obl (1 of 2)]
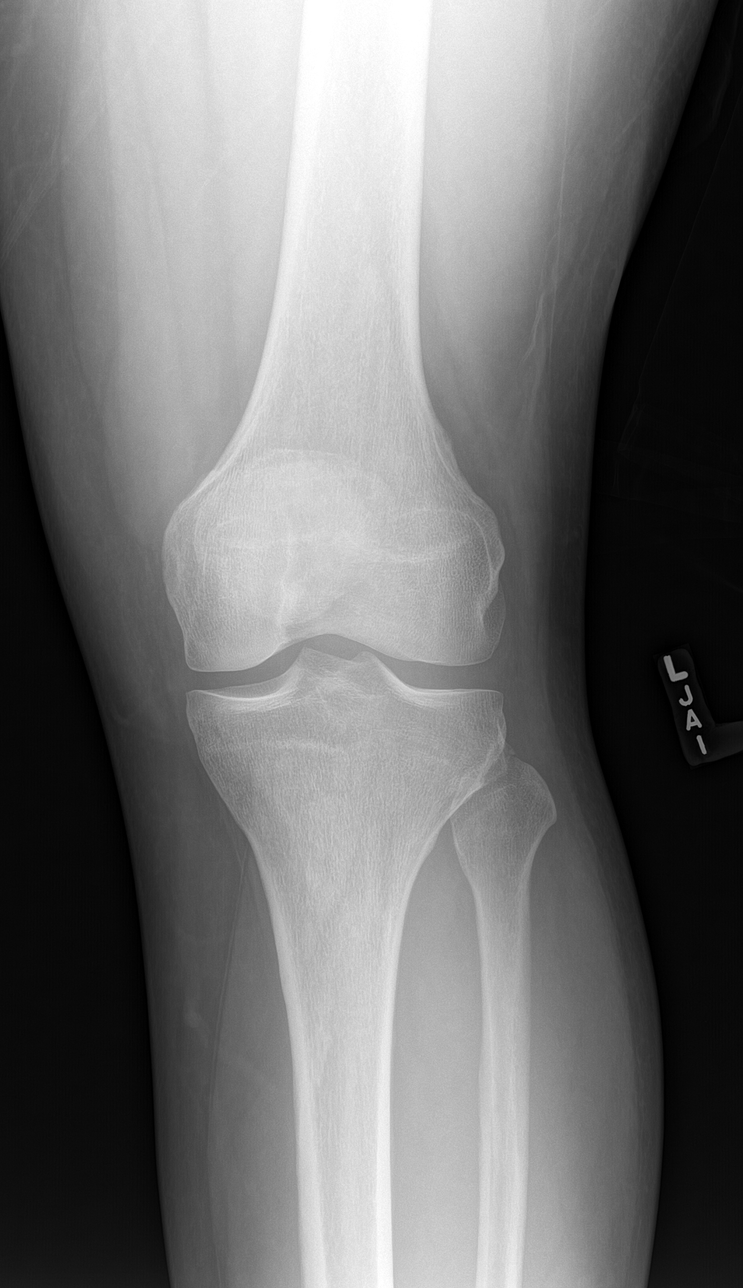

[knee obl (2 of 2)]
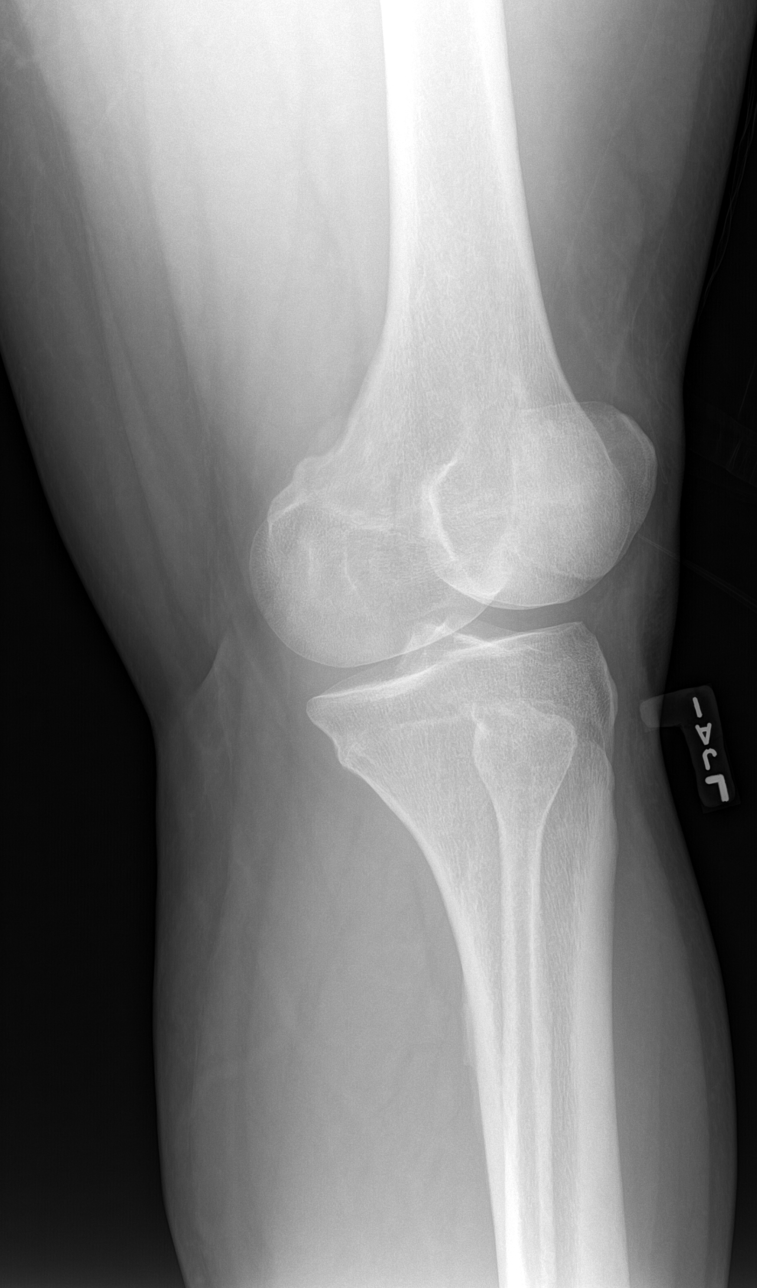

[knee lat]
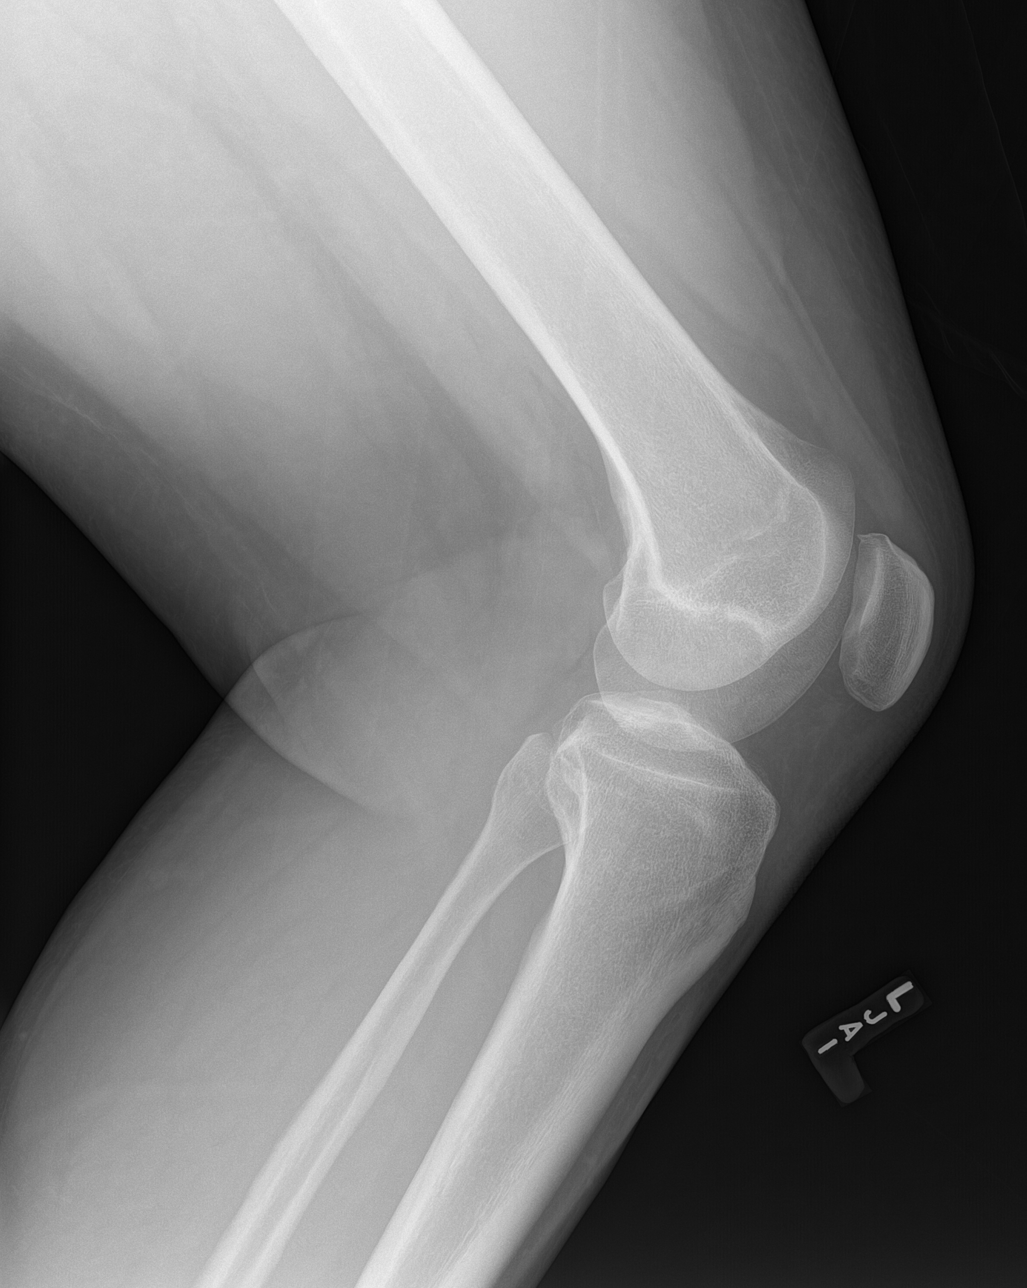

[4 of 4 positions shown; findings below may reference images not displayed]

FINDINGS: No evidence of fracture, dislocation, or joint effusion. No evidence
of arthropathy or other focal bone abnormality. Soft tissues are
unremarkable.
IMPRESSION: Negative.

## 2023-03-27 ENCOUNTER — Other Ambulatory Visit (HOSPITAL_BASED_OUTPATIENT_CLINIC_OR_DEPARTMENT_OTHER): Payer: Self-pay

## 2023-04-04 ENCOUNTER — Ambulatory Visit: Payer: BC Managed Care – PPO | Admitting: Family Medicine

## 2023-04-04 ENCOUNTER — Other Ambulatory Visit (HOSPITAL_BASED_OUTPATIENT_CLINIC_OR_DEPARTMENT_OTHER): Payer: Self-pay

## 2023-04-04 ENCOUNTER — Telehealth: Payer: Self-pay

## 2023-04-04 NOTE — Telephone Encounter (Signed)
 Quantity limit exception sent via Covermymeds; KEY: [id number]. Awaiting determination.

## 2023-04-04 NOTE — Telephone Encounter (Signed)
Sent pt message letting him know PA was approved.

## 2023-04-04 NOTE — Telephone Encounter (Signed)
 PA approved.   Message from plan: Approved. This drug has been approved. Approved quantity: 2 milliliters per 28 day(s). You may fill up to a 34 day supply at a retail pharmacy. You may fill up to a 90 day supply for maintenance drugs, please refer to the formulary for details. Please call the pharmacy to process your prescription claim.. Authorization Expiration Date: October 01, 2023.

## 2023-04-06 ENCOUNTER — Other Ambulatory Visit (HOSPITAL_BASED_OUTPATIENT_CLINIC_OR_DEPARTMENT_OTHER): Payer: Self-pay

## 2023-04-19 NOTE — Therapy (Signed)
OUTPATIENT PHYSICAL THERAPY LOWER EXTREMITY EVALUATION   Patient Name: Brendan Gregory MRN: [id number] DOB:1980/09/26, 42 y.o., male Today's Date: 04/24/2023   END OF SESSION:  PT End of Session - 04/24/23 1010     Visit Number 1    Authorization Type [employer]    PT Start Time 1010    PT Stop Time 1058    PT Time Calculation (min) 48 min    Activity Tolerance Patient tolerated treatment well    Behavior During Therapy WFL for tasks assessed/performed   Distracted by presence of toddler aged daughter            Past Medical History:  Diagnosis Date   Agatston coronary artery calcium score less than 100    Ca score 1.  No CAD on coronary CTA   Hypertension    Past Surgical History:  Procedure Laterality Date   MOLE REMOVAL     outpatient procedure   testicular     for undescenced testicle   Patient Active Problem List   Diagnosis Date Noted   Hypokalemia 03/14/2022   Acute cough 03/08/2022   Pansinusitis 03/08/2022   Weakness 03/08/2022   Panic attack 10/25/2021   Generalized anxiety disorder 10/25/2021   Solitary pulmonary nodule 05/04/2021   Agatston coronary artery calcium score less than 100 01/25/2021   Morbid obesity (HCC) 11/24/2020   Obstructive sleep apnea on CPAP 05/16/2019   Essential hypertension 05/16/2019    PCP: Sharlene Dory, DO   REFERRING PROVIDER: Sharlene Dory, DO  REFERRING DIAG: [phone] (ICD-10-CM) - Chronic pain of right knee   THERAPY DIAG:  Chronic pain of right knee  Stiffness of right knee, not elsewhere classified  Other abnormalities of gait and mobility  Muscle weakness (generalized)  Localized edema  RATIONALE FOR EVALUATION AND TREATMENT: Rehabilitation  ONSET DATE: October 2024  NEXT MD VISIT: 09/29/23   SUBJECTIVE:                                                                                                                                                                                                          SUBJECTIVE STATEMENT: Pt reports he has been having an ongoing issue where his R knee feels locked up, preventing him from bending his knee or pivoting on his leg. Increased pain when putting more weight on R LE.  Feels like there is scar tissue "cluttering it up".  He feels like all of this started when he started taking WEGOVY.  Family h/o of DVTs.  PAIN: Are you having pain? Yes: NPRS  scale: 2/10 currently, 3-4/10 day to day, up to 7-8/10 Pain location: R medial anterior knee/peripatellar Pain description: dull, scar tissue resonating ache; shock when he hyperextends Aggravating factors: toddler running into knee, sitting in figure-4, walking, squatting, climbing stairs, kneeling/crawling with toddler Relieving factors: long leg distraction, "cracking" or "popping" knee, sometimes elliptical   PERTINENT HISTORY:  HTN, GAD, morbid obesity, OSA  PRECAUTIONS: None  RED FLAGS: None  WEIGHT BEARING RESTRICTIONS: No  FALLS:  Has patient fallen in last 6 months? 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 Patellar tendon normal-appearing Lateral joint line normal-appearing Medial joint line normal-appearing Anterior lateral knee no clear abnormality visible.  However this area is tender to palpation with ultrasound  probe. Common fibular nerve normal-appearing nontender to ultrasound probe. IMPRESSION: Largely normal-appearing knee MSK ultrasound.  09/02/21 - DG L knee: FINDINGS: No evidence of fracture, dislocation, or joint effusion. No evidence of arthropathy or other focal bone abnormality. Soft tissues are unremarkable. IMPRESSION: Negative.  PATIENT SURVEYS:  LEFS 59 / 80 = 73.8 %  COGNITION: Overall cognitive status: Within functional limits for tasks assessed    SENSATION: WFL  EDEMA:  Pt denies, but R knee appears larger than L today.  POSTURE:  Slight genu varus with recurvatum on R>L  PALPATION: TTP over medial plica and inferior medial patella. Decreased/delayed VMO contraction on L quad set.  MUSCLE LENGTH: Hamstrings: mild light L>R ITB: mod tight B Piriformis: WFL Hip flexors: WFL Quads: mild tight  Heelcord: NT  LOWER EXTREMITY ROM:  Active ROM Right eval Left eval  Knee flexion 113 p! 134  Knee extension 0 0  (Blank rows = not tested)  LOWER EXTREMITY MMT:  MMT Right eval Left eval  Hip flexion 4- p! 5  Hip extension 4+ 4+  Hip abduction 4 4+  Hip adduction 4+ 4+  Hip internal rotation 4+ 5  Hip external rotation 4- p! 4+  Knee flexion 4+ 5  Knee extension 4 p! 5  Ankle dorsiflexion 5 5  Ankle plantarflexion    Ankle inversion    Ankle eversion     (Blank rows = not tested)   TODAY'S TREATMENT:   04/24/2023 - Eval   PATIENT EDUCATION:  Education details: PT eval findings and anticipated POC  Person educated: Patient Education method: Explanation Education comprehension: verbalized understanding  HOME EXERCISE PROGRAM: TBD   ASSESSMENT:  CLINICAL IMPRESSION: Brendan Gregory is a 43 y.o. male who was referred to physical therapy for evaluation and treatment for chronic R knee pain.  Patient reports onset of R knee pain beginning in October 2024, potentially coinciding with starting WEGOVY, but otherwise w/o known MOI.  Pain is worse with his  toddler daughter running into his knee, sitting in figure-4, walking, squatting, climbing stairs, kneeling/crawling with toddler.  Patient has deficits in R knee flexion ROM, proximal  LE flexibility, R>L LE strength, abnormal LE posture, and TTP along  R knee medial joint line and peripatellar area which are interfering with ADLs and are impacting quality of life.  On LEFS patient scored 59/80 demonstrating 26.2% disability.  Brendan "Josh" will benefit from skilled PT to address above deficits to improve mobility and activity tolerance with decreased pain interference.  OBJECTIVE IMPAIRMENTS: Abnormal gait, decreased activity tolerance, decreased mobility, difficulty walking, decreased ROM, decreased strength, hypomobility, increased edema, increased fascial restrictions, impaired perceived functional ability, increased muscle spasms, impaired flexibility, improper body mechanics, postural dysfunction, and pain.   ACTIVITY LIMITATIONS: bending, squatting, stairs, transfers, locomotion level, and caring for others  PARTICIPATION LIMITATIONS: shopping, community activity, and yard work  PERSONAL FACTORS: Fitness, Past/current experiences, Profession, Time since onset of injury/illness/exacerbation, and 3+ comorbidities: HTN, GAD, morbid obesity, OSA  are also affecting patient's functional outcome.   REHAB POTENTIAL: Good  CLINICAL DECISION MAKING: Stable/uncomplicated  EVALUATION COMPLEXITY: Low   GOALS: Goals reviewed with patient? Yes  SHORT TERM GOALS: Target date: 05/15/2023   Patient will be independent with initial HEP. Baseline:  Goal status: INITIAL  2.  Patient will report at least 25% improvement in R knee pain to improve QOL. Baseline: 2/10 currently, 3-4/10 day to day, up to 7-8/10 Goal status: INITIAL  3.  Patient will demonstrate improved R knee AROM to >/= 0-120 deg to allow for normal gait and stair mechanics. Baseline: 0-113 with pain at end ROM flexion Goal status:  INITIAL   LONG TERM GOALS: Target date: 06/05/2023  Patient will be independent with advanced/ongoing HEP to improve outcomes and carryover.  Baseline:  Goal status: INITIAL  2.  Patient will report at least 50-75% improvement in R knee pain to improve QOL. Baseline: 2/10 currently, 3-4/10 day to day, up to 7-8/10 Goal status: INITIAL  3.  Patient will demonstrate improved R knee AROM to >/= 0-130 deg to allow for normal gait and stair mechanics. Baseline: 0-113 with pain at end ROM flexion Goal status: INITIAL  4.  Patient will demonstrate improved B LE strength to >/= 4+/5 for improved stability and ease of mobility. Baseline: Refer to above LE MMT table Goal status: INITIAL  5. Patient will be able to ascend/descend stairs with 1 HR and reciprocal step pattern safely to access home and community.  Baseline:  Goal status: INITIAL  6.  Patient will report >/= 68/80 on LEFS (MCID = 9 pts) to demonstrate improved functional ability. Baseline: 59 / 80 = 73.8 % Goal status: INITIAL  7.  Patient will be able to perform a functional squat without limitation due to increased R knee pain or LOM. Baseline:  Goal status: INITIAL    PLAN:  PT FREQUENCY: 1-2x/week  PT DURATION: 6 weeks  PLANNED INTERVENTIONS: 97164- PT Re-evaluation, 97110-Therapeutic exercises, 97530- Therapeutic activity, [id number]- Neuromuscular re-education, 97535- Self Care, 16109- Manual therapy, [id number]- Gait training, [phone]- Aquatic Therapy, 97014- Electrical stimulation (unattended), [id number]- Electrical stimulation (manual), 97016- Vasopneumatic device, [id number]- Ultrasound, [id number]- Ionotophoresis 4mg /ml Dexamethasone, Patient/Family education, Balance training, Stair training, Taping, Dry Needling, Joint mobilization, Spinal mobilization, Cryotherapy, and Moist heat  PLAN FOR NEXT SESSION: Create initial HEP for L knee ROM/flexibility and L LE strengthening   Marry Guan, PT 04/24/2023, 4:32 PM

## 2023-04-20 ENCOUNTER — Other Ambulatory Visit (HOSPITAL_BASED_OUTPATIENT_CLINIC_OR_DEPARTMENT_OTHER): Payer: Self-pay

## 2023-04-24 ENCOUNTER — Other Ambulatory Visit: Payer: Self-pay

## 2023-04-24 ENCOUNTER — Ambulatory Visit: Payer: Medicaid Other | Attending: Family Medicine | Admitting: Physical Therapy

## 2023-04-24 DIAGNOSIS — G8929 Other chronic pain: Secondary | ICD-10-CM | POA: Insufficient documentation

## 2023-04-24 DIAGNOSIS — M6281 Muscle weakness (generalized): Secondary | ICD-10-CM | POA: Insufficient documentation

## 2023-04-24 DIAGNOSIS — M25661 Stiffness of right knee, not elsewhere classified: Secondary | ICD-10-CM | POA: Diagnosis present

## 2023-04-24 DIAGNOSIS — R2689 Other abnormalities of gait and mobility: Secondary | ICD-10-CM | POA: Insufficient documentation

## 2023-04-24 DIAGNOSIS — M25561 Pain in right knee: Secondary | ICD-10-CM | POA: Insufficient documentation

## 2023-04-24 DIAGNOSIS — R600 Localized edema: Secondary | ICD-10-CM | POA: Insufficient documentation

## 2023-04-26 ENCOUNTER — Other Ambulatory Visit (HOSPITAL_BASED_OUTPATIENT_CLINIC_OR_DEPARTMENT_OTHER): Payer: Self-pay

## 2023-04-26 ENCOUNTER — Ambulatory Visit: Payer: Medicaid Other

## 2023-04-27 ENCOUNTER — Other Ambulatory Visit: Payer: Self-pay | Admitting: Family Medicine

## 2023-05-01 ENCOUNTER — Ambulatory Visit: Payer: Medicaid Other

## 2023-05-01 DIAGNOSIS — R2689 Other abnormalities of gait and mobility: Secondary | ICD-10-CM

## 2023-05-01 DIAGNOSIS — R600 Localized edema: Secondary | ICD-10-CM

## 2023-05-01 DIAGNOSIS — M6281 Muscle weakness (generalized): Secondary | ICD-10-CM

## 2023-05-01 DIAGNOSIS — M25561 Pain in right knee: Secondary | ICD-10-CM | POA: Diagnosis not present

## 2023-05-01 DIAGNOSIS — G8929 Other chronic pain: Secondary | ICD-10-CM

## 2023-05-01 DIAGNOSIS — M25661 Stiffness of right knee, not elsewhere classified: Secondary | ICD-10-CM

## 2023-05-01 NOTE — Therapy (Signed)
OUTPATIENT PHYSICAL THERAPY TREATMENT   Patient Name: Brendan Gregory MRN: [id number] DOB:1980/06/25, 43 y.o., male Today's Date: 05/01/2023   END OF SESSION:  PT End of Session - 05/01/23 1102     Visit Number 2    Date for PT Re-Evaluation 06/05/23    Authorization Type [employer]    PT Start Time 1019    PT Stop Time 1101    PT Time Calculation (min) 42 min    Activity Tolerance Patient tolerated treatment well    Behavior During Therapy WFL for tasks assessed/performed   Distracted by presence of toddler aged daughter             Past Medical History:  Diagnosis Date   Agatston coronary artery calcium score less than 100    Ca score 1.  No CAD on coronary CTA   Hypertension    Past Surgical History:  Procedure Laterality Date   MOLE REMOVAL     outpatient procedure   testicular     for undescenced testicle   Patient Active Problem List   Diagnosis Date Noted   Hypokalemia 03/14/2022   Acute cough 03/08/2022   Pansinusitis 03/08/2022   Weakness 03/08/2022   Panic attack 10/25/2021   Generalized anxiety disorder 10/25/2021   Solitary pulmonary nodule 05/04/2021   Agatston coronary artery calcium score less than 100 01/25/2021   Morbid obesity (HCC) 11/24/2020   Obstructive sleep apnea on CPAP 05/16/2019   Essential hypertension 05/16/2019    PCP: Sharlene Dory, DO   REFERRING PROVIDER: Sharlene Dory, DO  REFERRING DIAG: [phone] (ICD-10-CM) - Chronic pain of right knee   THERAPY DIAG:  Chronic pain of right knee  Stiffness of right knee, not elsewhere classified  Other abnormalities of gait and mobility  Muscle weakness (generalized)  Localized edema  RATIONALE FOR EVALUATION AND TREATMENT: Rehabilitation  ONSET DATE: October 2024  NEXT MD VISIT: 09/29/23   SUBJECTIVE:                                                                                                                                                                                                          SUBJECTIVE STATEMENT: Pt reports he did a lot of hill climbing yesterday using his knee a lot, it is very sore today.   PAIN: Are you having pain? Yes: NPRS scale: 3 Pain location: R medial anterior knee/peripatellar Pain description: dull, scar tissue resonating ache; shock when he hyperextends Aggravating factors: toddler running into knee, sitting in figure-4, walking, squatting, climbing stairs, kneeling/crawling with toddler Relieving  factors: long leg distraction, "cracking" or "popping" knee, sometimes elliptical   PERTINENT HISTORY:  HTN, GAD, morbid obesity, OSA  PRECAUTIONS: None  RED FLAGS: None  WEIGHT BEARING RESTRICTIONS: No  FALLS:  Has patient fallen in last 6 months? 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 Patellar tendon normal-appearing Lateral joint line normal-appearing Medial joint line normal-appearing Anterior lateral knee no clear abnormality visible.  However this area is tender to palpation with ultrasound probe. Common fibular nerve normal-appearing nontender to ultrasound probe. IMPRESSION: Largely normal-appearing knee MSK ultrasound.  09/02/21 - DG L knee: FINDINGS: No evidence of fracture, dislocation, or joint effusion. No evidence of arthropathy or other  focal bone abnormality. Soft tissues are unremarkable. IMPRESSION: Negative.  PATIENT SURVEYS:  LEFS 59 / 80 = 73.8 %  COGNITION: Overall cognitive status: Within functional limits for tasks assessed    SENSATION: WFL  EDEMA:  Pt denies, but R knee appears larger than L today.  POSTURE:  Slight genu varus with recurvatum on R>L  PALPATION: TTP over medial plica and inferior medial patella. Decreased/delayed VMO contraction on L quad set.  MUSCLE LENGTH: Hamstrings: mild light L>R ITB: mod tight B Piriformis: WFL Hip flexors: WFL Quads: mild tight  Heelcord: NT  LOWER EXTREMITY ROM:  Active ROM Right eval Left eval  Knee flexion 113 p! 134  Knee extension 0 0  (Blank rows = not tested)  LOWER EXTREMITY MMT:  MMT Right eval Left eval  Hip flexion 4- p! 5  Hip extension 4+ 4+  Hip abduction 4 4+  Hip adduction 4+ 4+  Hip internal rotation 4+ 5  Hip external rotation 4- p! 4+  Knee flexion 4+ 5  Knee extension 4 p! 5  Ankle dorsiflexion 5 5  Ankle plantarflexion    Ankle inversion    Ankle eversion     (Blank rows = not tested)   TODAY'S TREATMENT:  05/01/23 Manual Therapy: to decrease muscle spasm, pain and improve mobility.  PROM for R knee flexion and ext Therapeutic Exercise: to improve strength and mobility.  Demo, verbal and tactile cues throughout for technique.  Supine HS stretch with strap 2x30" Supine quad sets 5x5" Supine SLR x 10 S/L R hip abduction x 10  S/L clamshells RTB x 15  04/24/2023 - Eval   PATIENT EDUCATION:  Education details: PT eval findings and anticipated POC  Person educated: Patient Education method: Explanation Education comprehension: verbalized understanding  HOME EXERCISE PROGRAM: Access Code: [email or web address] URL: [email or web address] Date: 05/01/2023 Prepared by: Verta Ellen  Exercises - Seated Long Arc Quad  - 1 x daily - 7 x weekly - 2 sets - 10 reps - Supine Hamstring Stretch with Strap  - 2 x  daily - 7 x weekly - 2 sets - 2 reps - 30 sec hold - Active Straight Leg Raise with Quad Set  - 1 x daily - 7 x weekly - 2  sets - 10 reps - Sidelying Hip Abduction  - 1 x daily - 7 x weekly - 2 sets - 10 reps - Clamshell with Resistance  - 1 x daily - 7 x weekly - 2 sets - 10 reps   ASSESSMENT:  CLINICAL IMPRESSION: Gentle progression of exercises today, He reported knee pain with a lot of movements today but also reported very minimal pain. Cues required to remain on his side during the clamshells and hip abduction. Cues required to avoid pushing to pain with exercises as well. Teigan "Josh" will benefit from skilled PT to address above deficits to improve mobility and activity tolerance with decreased pain interference.  OBJECTIVE IMPAIRMENTS: Abnormal gait, decreased activity tolerance, decreased mobility, difficulty walking, decreased ROM, decreased strength, hypomobility, increased edema, increased fascial restrictions, impaired perceived functional ability, increased muscle spasms, impaired flexibility, improper body mechanics, postural dysfunction, and pain.   ACTIVITY LIMITATIONS: bending, squatting, stairs, transfers, locomotion level, and caring for others  PARTICIPATION LIMITATIONS: shopping, community activity, and yard work  PERSONAL FACTORS: Fitness, Past/current experiences, Profession, Time since onset of injury/illness/exacerbation, and 3+ comorbidities: HTN, GAD, morbid obesity, OSA  are also affecting patient's functional outcome.   REHAB POTENTIAL: Good  CLINICAL DECISION MAKING: Stable/uncomplicated  EVALUATION COMPLEXITY: Low   GOALS: Goals reviewed with patient? Yes  SHORT TERM GOALS: Target date: 05/15/2023   Patient will be independent with initial HEP. Baseline:  Goal status: INITIAL  2.  Patient will report at least 25% improvement in R knee pain to improve QOL. Baseline: 2/10 currently, 3-4/10 day to day, up to 7-8/10 Goal status: INITIAL  3.  Patient  will demonstrate improved R knee AROM to >/= 0-120 deg to allow for normal gait and stair mechanics. Baseline: 0-113 with pain at end ROM flexion Goal status: INITIAL   LONG TERM GOALS: Target date: 06/05/2023  Patient will be independent with advanced/ongoing HEP to improve outcomes and carryover.  Baseline:  Goal status: INITIAL  2.  Patient will report at least 50-75% improvement in R knee pain to improve QOL. Baseline: 2/10 currently, 3-4/10 day to day, up to 7-8/10 Goal status: INITIAL  3.  Patient will demonstrate improved R knee AROM to >/= 0-130 deg to allow for normal gait and stair mechanics. Baseline: 0-113 with pain at end ROM flexion Goal status: INITIAL  4.  Patient will demonstrate improved B LE strength to >/= 4+/5 for improved stability and ease of mobility. Baseline: Refer to above LE MMT table Goal status: INITIAL  5. Patient will be able to ascend/descend stairs with 1 HR and reciprocal step pattern safely to access home and community.  Baseline:  Goal status: INITIAL  6.  Patient will report >/= 68/80 on LEFS (MCID = 9 pts) to demonstrate improved functional ability. Baseline: 59 / 80 = 73.8 % Goal status: INITIAL  7.  Patient will be able to perform a functional squat without limitation due to increased R knee pain or LOM. Baseline:  Goal status: INITIAL    PLAN:  PT FREQUENCY: 1-2x/week  PT DURATION: 6 weeks  PLANNED INTERVENTIONS: 97164- PT Re-evaluation, 97110-Therapeutic exercises, 97530- Therapeutic activity, [id number]- Neuromuscular re-education, 97535- Self Care, 54098- Manual therapy, [id number]- Gait training, [phone]- Aquatic Therapy, 97014- Electrical stimulation (unattended), [id number]- Electrical stimulation (manual), [id number]- Vasopneumatic device, [id number]- Ultrasound, [id number]- Ionotophoresis 4mg /ml Dexamethasone, Patient/Family education, Balance training, Stair training, Taping, Dry Needling, Joint mobilization, Spinal mobilization, Cryotherapy, and Moist  heat  PLAN FOR NEXT SESSION: Create initial HEP for L knee ROM/flexibility and  L LE strengthening   Olene Godfrey Caprice Red, PTA 05/01/2023, 11:10 AM

## 2023-05-02 ENCOUNTER — Other Ambulatory Visit: Payer: Self-pay | Admitting: Family Medicine

## 2023-05-04 ENCOUNTER — Ambulatory Visit: Payer: Medicaid Other | Admitting: Physical Therapy

## 2023-05-08 ENCOUNTER — Ambulatory Visit: Payer: Medicaid Other | Admitting: Physical Therapy

## 2023-05-08 ENCOUNTER — Telehealth: Payer: 59 | Admitting: Nurse Practitioner

## 2023-05-08 DIAGNOSIS — J069 Acute upper respiratory infection, unspecified: Secondary | ICD-10-CM | POA: Diagnosis not present

## 2023-05-08 MED ORDER — PROMETHAZINE-DM 6.25-15 MG/5ML PO SYRP
5.0000 mL | ORAL_SOLUTION | Freq: Four times a day (QID) | ORAL | 0 refills | Status: DC | PRN
Start: 2023-05-08 — End: 2023-05-31

## 2023-05-08 MED ORDER — IPRATROPIUM BROMIDE 0.03 % NA SOLN
2.0000 | Freq: Two times a day (BID) | NASAL | 12 refills | Status: DC
Start: 2023-05-08 — End: 2023-05-10

## 2023-05-08 NOTE — Progress Notes (Signed)
E visit for Flu like symptoms   We are sorry that you are not feeling well.  Here is how we plan to help! Based on what you have shared with me it looks like you may have flu-like symptoms that should be watched but do not seem to indicate anti-viral treatment.  Influenza or "the flu" is   an infection caused by a respiratory virus. The flu virus is highly contagious and persons who did not receive their yearly flu vaccination may "catch" the flu from close contact.  We have anti-viral medications to treat the viruses that cause this infection. They are not a "cure" and only shorten the course of the infection. These prescriptions are most effective when they are given within the first 2 days of "flu" symptoms. Antiviral medication are indicated if you have a high risk of complications from the flu. You should  also consider an antiviral medication if you are in close contact with someone who is at risk. These medications can help patients avoid complications from the flu  but have side effects that you should know. Possible side effects from Tamiflu or oseltamivir include nausea, vomiting, diarrhea, dizziness, headaches, eye redness, sleep problems or other respiratory symptoms. You should not take Tamiflu if you have an allergy to oseltamivir or any to the ingredients in Tamiflu.  Based upon your symptoms and potential risk factors we will prescribe cough medicine and a nasal spray. It is late for anti-virals as they are not typically effective after 48 hour of symptoms of flu, and early for antibiotics. If symptoms persist for longer than one week we would like you to follow up with [place] or your primary care.   Meds ordered this encounter  Medications   promethazine-dextromethorphan (PROMETHAZINE-DM) 6.25-15 MG/5ML syrup    Sig: Take 5 mLs by mouth 4 (four) times daily as needed for cough.    Dispense:  118 mL    Refill:  0   ipratropium (ATROVENT) 0.03 % nasal spray    Sig: Place 2 sprays into  both nostrils every 12 (twelve) hours.    Dispense:  30 mL    Refill:  12     ANYONE WHO HAS FLU SYMPTOMS SHOULD: Stay home. The flu is highly contagious and going out or to work exposes others! Be sure to drink plenty of fluids. Water is fine as well as fruit juices, sodas and electrolyte beverages. You may want to stay away from caffeine or alcohol. If you are nauseated, try taking small sips of liquids. How do you know if you are getting enough fluid? Your urine should be a pale yellow or almost colorless. Get rest. Taking a steamy shower or using a humidifier may help nasal congestion and ease sore throat pain. Using a saline nasal spray works much the same way. Cough drops, hard candies and sore throat lozenges may ease your cough. Line up a caregiver. Have someone check on you regularly.   GET HELP RIGHT AWAY IF: You cannot keep down liquids or your medications. You become short of breath Your fell like you are going to pass out or loose consciousness. Your symptoms persist after you have completed your treatment plan MAKE SURE YOU  Understand these instructions. Will watch your condition. Will get help right away if you are not doing well or get worse.  Your e-visit answers were reviewed by a board certified advanced clinical practitioner to complete your personal care plan.  Depending on the condition, your plan could have  included both over the counter or prescription medications.  If there is a problem please reply  once you have received a response from your provider.  Your safety is important to [place].  If you have drug allergies check your prescription carefully.    You can use MyChart to ask questions about today's visit, request a non-urgent call back, or ask for a work or school excuse for 24 hours related to this e-Visit. If it has been greater than 24 hours you will need to follow up with your provider, or enter a new e-Visit to address those concerns.  You will get an  e-mail in the next two days asking about your experience.  I hope that your e-visit has been valuable and will speed your recovery. Thank you for using e-visits.   I spent approximately 5 minutes reviewing the patient's history, current symptoms and coordinating their care today.

## 2023-05-10 ENCOUNTER — Ambulatory Visit
Admission: EM | Admit: 2023-05-10 | Discharge: 2023-05-10 | Disposition: A | Discharge status: Home / Self Care | Payer: 59 | Attending: Family Medicine | Admitting: Family Medicine

## 2023-05-10 ENCOUNTER — Other Ambulatory Visit: Payer: Self-pay

## 2023-05-10 DIAGNOSIS — J101 Influenza due to other identified influenza virus with other respiratory manifestations: Secondary | ICD-10-CM | POA: Diagnosis not present

## 2023-05-10 LAB — POCT INFLUENZA A/B
Influenza A, POC: NEGATIVE
Influenza B, POC: NEGATIVE

## 2023-05-10 MED ORDER — OSELTAMIVIR PHOSPHATE 75 MG PO CAPS: 75.0000 mg | ORAL_CAPSULE | Freq: Two times a day (BID) | ORAL | 0 refills | Status: DC

## 2023-05-10 NOTE — ED Triage Notes (Signed)
 Started getting ill Friday, had fever Saturday. Currently has c/o dizziness, brain fog, cough, congestion, sweating, achiness. Did covid test last night which was negative. Has taken dayquil, nyquil, mucinex dm,

## 2023-05-10 NOTE — ED Provider Notes (Signed)
[facility]    CSN: [id number] Arrival date & time: 05/10/23  0909      History   Chief Complaint Chief Complaint  Patient presents with   Nasal Congestion    HPI Brendan Gregory is a 43 y.o. male.   Brendan Gregory is here with his 76-year-old daughter.  She is sick with vomiting and is testing positive for influenza.  He also has respiratory symptoms, cough, dizziness, body aches, and fever.    Past Medical History:  Diagnosis Date   Agatston coronary artery calcium  score less than 100    Ca score 1.  No CAD on coronary CTA   Hypertension     Patient Active Problem List   Diagnosis Date Noted   Hypokalemia 03/14/2022   Acute cough 03/08/2022   Pansinusitis 03/08/2022   Weakness 03/08/2022   Panic attack 10/25/2021   Generalized anxiety disorder 10/25/2021   Solitary pulmonary nodule 05/04/2021   Agatston coronary artery calcium  score less than 100 01/25/2021   Morbid obesity (HCC) 11/24/2020   Obstructive sleep apnea on CPAP 05/16/2019   Essential hypertension 05/16/2019    Past Surgical History:  Procedure Laterality Date   MOLE REMOVAL     outpatient procedure   testicular     for undescenced testicle       Home Medications    Prior to Admission medications   Medication Sig Start Date End Date Taking? Authorizing Provider  oseltamivir  (TAMIFLU ) 75 MG capsule Take 1 capsule (75 mg total) by mouth every 12 (twelve) hours. 05/10/23  Yes Maranda Jamee Jacob, MD  atorvastatin  (LIPITOR) 10 MG tablet TAKE 1 TABLET BY MOUTH EVERY DAY 05/02/23   Wendling, Mabel Mt, DO  Garlic (GARLIQUE) 400 MG TBEC Take by mouth.    [provider]  losartan  (COZAAR ) 100 MG tablet Take 1 tablet (100 mg total) by mouth daily. 01/25/23 04/25/23  Frann Mabel Mt, DO  Omega-3 Fatty Acids (FISH OIL) 1000 MG CAPS Take by mouth daily.    [provider]  OVER THE COUNTER MEDICATION Take 1 capsule by mouth daily at 6 (six) AM. Aged beet root    [provider]  promethazine -dextromethorphan (PROMETHAZINE -DM) 6.25-15 MG/5ML syrup Take 5 mLs by mouth 4 (four) times daily as needed for cough. 05/08/23   Kennyth Domino, FNP  Semaglutide -Weight Management (WEGOVY ) 0.25 MG/0.5ML SOAJ Inject 0.25 mg into the skin once a week. 03/24/23   Frann Mabel Mt, DO    Family History Family History  Problem Relation Age of Onset   Healthy Mother    Prostate cancer Father 75   Diabetes Father    Hypertension Father    Diabetes Brother    Hypertension Brother    Diabetes Maternal Grandfather     Social History Social History   Tobacco Use   Smoking status: Never    Passive exposure: Never   Smokeless tobacco: Never  Vaping Use   Vaping status: Former   Substances: THC  Substance Use Topics   Alcohol use: Not Currently   Drug use: Not Currently    Frequency: 7.0 times per week    Types: Marijuana    Comment: daily use marijuana smoking.  Quit 2021.     Allergies   Amlodipine    Review of Systems Review of Systems See HPI  Physical Exam Triage Vital Signs ED Triage Vitals  Encounter Vitals Group     BP 05/10/23 0936 (!) 143/83     Systolic BP Percentile --  Diastolic BP Percentile --      Pulse Rate 05/10/23 0936 89     Resp 05/10/23 0936 16     Temp 05/10/23 0936 98.5 F (36.9 C)     Temp Source 05/10/23 0936 Oral     SpO2 05/10/23 0936 98 %     Weight --      Height --      Head Circumference --      Peak Flow --      Pain Score 05/10/23 0940 2     Pain Loc --      Pain Education --      Exclude from Growth Chart --    No data found.  Updated Vital Signs BP (!) 143/83   Pulse 89   Temp 98.5 F (36.9 C) (Oral)   Resp 16   SpO2 98%      Physical Exam Constitutional:      General: He is not in acute distress.    Appearance: He is well-developed. He is obese. He is ill-appearing.  HENT:     Head: Normocephalic and atraumatic.     Right Ear: Tympanic membrane normal.     Left Ear: Tympanic  membrane normal.     Nose: Nose normal.     Mouth/Throat:     Pharynx: Posterior oropharyngeal erythema present.  Eyes:     Conjunctiva/sclera: Conjunctivae normal.     Pupils: Pupils are equal, round, and reactive to light.  Cardiovascular:     Rate and Rhythm: Normal rate and regular rhythm.     Heart sounds: Normal heart sounds.  Pulmonary:     Effort: Pulmonary effort is normal. No respiratory distress.     Breath sounds: Normal breath sounds.  Musculoskeletal:        General: Normal range of motion.     Cervical back: Normal range of motion.  Skin:    General: Skin is warm and dry.  Neurological:     Mental Status: He is alert.      UC Treatments / Results  Labs (all labs ordered are listed, but only abnormal results are displayed) Labs Reviewed  POCT INFLUENZA A/B    EKG   Radiology No results found.  Procedures Procedures (including critical care time)  Medications Ordered in UC Medications - No data to display  Initial Impression / Assessment and Plan / UC Course  I have reviewed the triage vital signs and the nursing notes.  Pertinent labs & imaging results that were available during my care of the patient were reviewed by me and considered in my medical decision making (see chart for details).     Patient has a negative flu test but he is here with his daughter, similar symptoms, and she test positive for influenza A.  I feel he is a false negative.  Will treat with Tamiflu  Final Clinical Impressions(s) / UC Diagnoses   Final diagnoses:  Influenza A     Discharge Instructions      Take Tamiflu  2 times a day for 5 days Drink lots of fluids May take Tylenol  or ibuprofen for pain and fever May use over-the-counter cough and cold medicine   ED Prescriptions     Medication Sig Dispense Auth. Provider   oseltamivir  (TAMIFLU ) 75 MG capsule Take 1 capsule (75 mg total) by mouth every 12 (twelve) hours. 10 capsule Maranda Jamee Jacob, MD       PDMP not reviewed this encounter.   Maranda Jamee Jacob, MD  05/10/23 1043  

## 2023-05-10 NOTE — Discharge Instructions (Signed)
 Take Tamiflu  2 times a day for 5 days Drink lots of fluids May take Tylenol  or ibuprofen for pain and fever May use over-the-counter cough and cold medicine

## 2023-05-11 ENCOUNTER — Encounter: Payer: 59 | Admitting: Physical Therapy

## 2023-05-15 ENCOUNTER — Encounter: Payer: 59 | Admitting: Physical Therapy

## 2023-05-24 ENCOUNTER — Ambulatory Visit: Payer: Medicaid Other

## 2023-05-29 ENCOUNTER — Encounter: Payer: Self-pay | Admitting: Family Medicine

## 2023-05-30 ENCOUNTER — Encounter: Payer: Self-pay | Admitting: Physical Therapy

## 2023-05-30 ENCOUNTER — Ambulatory Visit: Payer: Medicaid Other | Attending: Family Medicine | Admitting: Physical Therapy

## 2023-05-30 DIAGNOSIS — R2689 Other abnormalities of gait and mobility: Secondary | ICD-10-CM | POA: Diagnosis present

## 2023-05-30 DIAGNOSIS — R600 Localized edema: Secondary | ICD-10-CM | POA: Insufficient documentation

## 2023-05-30 DIAGNOSIS — G8929 Other chronic pain: Secondary | ICD-10-CM | POA: Diagnosis present

## 2023-05-30 DIAGNOSIS — M6281 Muscle weakness (generalized): Secondary | ICD-10-CM | POA: Diagnosis present

## 2023-05-30 DIAGNOSIS — M25561 Pain in right knee: Secondary | ICD-10-CM | POA: Insufficient documentation

## 2023-05-30 DIAGNOSIS — M25661 Stiffness of right knee, not elsewhere classified: Secondary | ICD-10-CM | POA: Insufficient documentation

## 2023-05-30 NOTE — Therapy (Signed)
 OUTPATIENT PHYSICAL THERAPY TREATMENT / RECERTIFICATION  Progress Note  Reporting Period 04/24/2023 to 05/30/2023   See note below for Objective Data and Assessment of Progress/Goals.     Patient Name: Brendan Gregory MRN: [id number] DOB:[birth year], 43 y.o., male Today's Date: 05/30/2023   END OF SESSION:  PT End of Session - 05/30/23 0932     Visit Number 3    Date for PT Re-Evaluation 07/25/23    Authorization Type [employer] Medicaid (as of 05/06/23); changing to [place] [facility] [place] as of 06/03/23    Authorization Time Period --    Authorization - Visit Number --    Authorization - Number of Visits --    PT Start Time 0932    PT Stop Time 1018    PT Time Calculation (min) 46 min    Activity Tolerance Patient tolerated treatment well    Behavior During Therapy WFL for tasks assessed/performed   Distracted by presence of toddler aged daughter              Past Medical History:  Diagnosis Date   Agatston coronary artery calcium score less than 100    Ca score 1.  No CAD on coronary CTA   Hypertension    Past Surgical History:  Procedure Laterality Date   MOLE REMOVAL     outpatient procedure   testicular     for undescenced testicle   Patient Active Problem List   Diagnosis Date Noted   Hypokalemia 03/14/2022   Acute cough 03/08/2022   Pansinusitis 03/08/2022   Weakness 03/08/2022   Panic attack 10/25/2021   Generalized anxiety disorder 10/25/2021   Solitary pulmonary nodule 05/04/2021   Agatston coronary artery calcium score less than 100 01/25/2021   Morbid obesity (HCC) 11/24/2020   Obstructive sleep apnea on CPAP 05/16/2019   Essential hypertension 05/16/2019    PCP: Sharlene Dory, DO   REFERRING PROVIDER: Sharlene Dory, DO  REFERRING DIAG: [phone] (ICD-10-CM) - Chronic pain of right knee   THERAPY DIAG:  Chronic pain of right knee  Stiffness of right knee, not elsewhere classified  Other abnormalities of gait and  mobility  Muscle weakness (generalized)  Localized edema  RATIONALE FOR EVALUATION AND TREATMENT: Rehabilitation  ONSET DATE: October 2024  NEXT MD VISIT: 05/31/23   SUBJECTIVE:                                                                                                                                                                                                         SUBJECTIVE STATEMENT: Pt returning for the  1st time in a month due to illness/flu.  Pain has been variable over the past month with some relief from the stretches but still limiting activity tolerance.   PAIN: Are you having pain? Yes: NPRS scale: 2/10  Pain location: R medial anterior knee/peripatellar Pain description: stiff, locking up  Aggravating factors: toddler running into knee, sitting in figure-4, walking, squatting, climbing stairs, kneeling/crawling with toddler Relieving factors: long leg distraction, "cracking" or "popping" knee, sometimes elliptical   PERTINENT HISTORY:  HTN, GAD, morbid obesity, OSA  PRECAUTIONS: None  RED FLAGS: None  WEIGHT BEARING RESTRICTIONS: No  FALLS:  Has patient fallen in last 6 months? 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 Patellar tendon normal-appearing Lateral joint line normal-appearing Medial joint line  normal-appearing Anterior lateral knee no clear abnormality visible.  However this area is tender to palpation with ultrasound probe. Common fibular nerve normal-appearing nontender to ultrasound probe. IMPRESSION: Largely normal-appearing knee MSK ultrasound.  09/02/21 - DG L knee: FINDINGS: No evidence of fracture, dislocation, or joint effusion. No evidence of arthropathy or other focal bone abnormality. Soft tissues are unremarkable. IMPRESSION: Negative.  PATIENT SURVEYS:  LEFS 59 / 80 = 73.8 %  COGNITION: Overall cognitive status: Within functional limits for tasks assessed    SENSATION: WFL  EDEMA:  Pt denies, but R knee appears larger than L today.  POSTURE:  Slight genu varus with recurvatum on R>L  PALPATION: TTP over medial plica and inferior medial patella. Decreased/delayed VMO contraction on L quad set.  MUSCLE LENGTH: Hamstrings: mild light L>R ITB: mod tight B Piriformis: WFL Hip flexors: WFL Quads: mild tight  Heelcord: NT  LOWER EXTREMITY ROM:  Active ROM Right eval Left eval R 05/30/23  Knee flexion 113 p! 134 122 p!  Knee extension 0 0 0  (Blank rows = not tested)  LOWER EXTREMITY MMT:  MMT Right eval Left eval R 05/30/23 L 05/30/23  Hip flexion 4- p! 5 4 p! 5  Hip extension 4+ 4+ 4+ 4+  Hip abduction 4 4+ 4 4+  Hip adduction 4+ 4+ 4 4+  Hip internal rotation 4+ 5 4+ p! 5  Hip external rotation 4- p! 4+ 4- p! 4+  Knee flexion 4+ 5 4+ p! 5  Knee extension 4 p! 5 4+ 5  Ankle dorsiflexion 5 5 5 5   Ankle plantarflexion      Ankle inversion      Ankle eversion       (Blank rows = not tested)   TODAY'S TREATMENT:   05/30/23 - Recert THERAPEUTIC EXERCISE: To improve strength, endurance, ROM, and flexibility.  Demonstration, verbal and tactile cues throughout for technique.  Seated hip hinge HS stretch + DF for calf stretch 3 x 30" NuStep - L4 x 6 min Hooklying R SLR 2 x 10 - cues for quad set prior to initiation of lift S/L R GTB clam 2 x  10  S/L R hip abduction x 10 - cues to prevent trunk rotation posteriorly and to lift LE with extension bias Supine HS stretch with strap x 30" Supine cross body ITB stretch with strap x 30" Mild Thomas quad/hip flexor stretch with strap x 30"  THERAPEUTIC ACTIVITIES: To improve functional performance.  Demonstration, verbal and tactile cues throughout for technique. ROM & MMT Goal assessment  05/01/23 Manual Therapy: to decrease muscle spasm, pain and improve mobility.  PROM for R knee flexion and ext Therapeutic Exercise: to improve strength and mobility.  Demo, verbal and tactile cues throughout for technique.  Supine HS stretch with strap 2x30" Supine quad sets 5x5" Supine SLR x 10 S/L R hip abduction x 10 S/L clamshells RTB x 15   04/24/2023 - Eval   PATIENT EDUCATION:  Education details: progress with PT, ongoing PT POC, HEP review, and HEP update - ITB & quad stretches   Person educated: Patient Education method: Explanation, Demonstration, Tactile cues, Verbal cues, and Handouts Education comprehension: verbalized understanding, returned demonstration, verbal cues required, tactile cues required, and needs further education  HOME EXERCISE PROGRAM: Access Code: [email or web address] URL: [email or web address] Date: 05/30/2023 Prepared by: Glenetta Hew  Exercises - Seated Long Arc Quad  - 1 x daily - 7 x weekly - 2 sets - 10 reps - Supine Hamstring Stretch with Strap  - 2 x daily - 7 x weekly - 2 sets - 2 reps - 30 sec hold - Supine Iliotibial Band Stretch with Strap  - 2 x daily - 7 x weekly - 3 reps - 30 sec hold - Hip Flexor Stretch with Strap on Table (Mirrored)  - 2 x daily - 7 x weekly - 3 reps - 30 sec hold - Active Straight Leg Raise with Quad Set  - 1 x daily - 7 x weekly - 2 sets - 10 reps - Sidelying Hip Abduction  - 1 x daily - 7 x weekly - 2 sets - 10 reps - Clamshell with Resistance  - 1 x daily - 7 x weekly - 2 sets - 10 reps   ASSESSMENT:  CLINICAL  IMPRESSION: Melton "Josh" returns to PT after a 4-week absence due to illness/flu.  He reports his R knee pain remains variable with only about a 2% improvement since initial eval.  R knee flexion ROM has improved to 122 (STG #3 met) but still with pain near end ROM.  Minimal change in B LE strength with MMT resistance triggering increased R knee pain for several motions.  He reports he has tried to keep up with the HEP and does note some benefit from the stretches that required extensive verbal and tactile cueing during review today for proper technique with most exercises and has been unable to keep up with resisted clams due to tearing of the provided Thera-Band.  Minimal progress thus far due to only 2 treatment visits and extended absence from PT.  Sharia Reeve will benefit from continued skilled PT to address ongoing deficits and impairments to improve mobility and activity tolerance with decreased pain interference, therefore will recommend recert for additional 1-2x/wk for up to 8 weeks.   OBJECTIVE IMPAIRMENTS: Abnormal gait, decreased activity tolerance, decreased mobility, difficulty walking, decreased ROM, decreased strength, hypomobility, increased edema, increased fascial restrictions, impaired perceived functional ability, increased muscle spasms, impaired flexibility, improper body mechanics, postural dysfunction, and pain.   ACTIVITY LIMITATIONS: bending, squatting, stairs, transfers, locomotion level, and caring for others  PARTICIPATION LIMITATIONS: shopping, community activity, and yard work  PERSONAL FACTORS: Fitness, Past/current experiences, Profession, Time since onset of injury/illness/exacerbation, and 3+ comorbidities: HTN, GAD, morbid obesity, OSA  are also affecting patient's functional outcome.   REHAB POTENTIAL: Good  CLINICAL DECISION MAKING: Stable/uncomplicated  EVALUATION COMPLEXITY: Low   GOALS: Goals reviewed with patient? Yes  SHORT TERM GOALS: Target date:  05/15/2023, extended to 06/27/2023   Patient will be independent with initial HEP. Baseline:  Goal status: IN PROGRESS - 05/30/23 - clarification necessary for most exercises  2.  Patient will report at least 25% improvement in R knee pain to improve QOL. Baseline: 2/10 currently, 3-4/10 day to day, up to 7-8/10 Goal status: IN PROGRESS - 05/30/23 - pt reporting only 2% improvement  3.  Patient will demonstrate improved R knee AROM to >/= 0-120 deg to allow for normal gait and stair mechanics. Baseline: 0-113 with pain at end ROM flexion Goal status: MET - 05/30/23 - R knee 0-122   LONG TERM GOALS: Target date: 06/05/2023, extended to 07/25/2023  Patient will be independent with advanced/ongoing HEP to improve outcomes and carryover.  Baseline:  Goal status: IN PROGRESS   2.  Patient will report at least 50-75% improvement in R knee pain to improve QOL. Baseline: 2/10 currently, 3-4/10 day to day, up to 7-8/10 Goal status: IN PROGRESS  3.  Patient will demonstrate improved R knee AROM to >/= 0-130 deg to allow for normal gait and stair mechanics. Baseline: 0-113 with pain at end ROM flexion Goal status: IN PROGRESS  4.  Patient will demonstrate improved B LE strength to >/= 4+/5 for improved stability and ease of mobility. Baseline: Refer to above LE MMT table Goal status: IN PROGRESS  5. Patient will be able to ascend/descend stairs with 1 HR and reciprocal step pattern safely to access home and community.  Baseline:  Goal status: IN PROGRESS  6.  Patient will report >/= 68/80 on LEFS (MCID = 9 pts) to demonstrate improved functional ability. Baseline: 59 / 80 = 73.8 % Goal status: IN PROGRESS  7.  Patient will be able to perform a functional squat without limitation due to increased R knee pain or LOM. Baseline:  Goal status: IN PROGRESS    PLAN:  PT FREQUENCY: 1-2x/week  PT DURATION: 8 weeks  PLANNED INTERVENTIONS: 97164- PT Re-evaluation, 97110-Therapeutic  exercises, 97530- Therapeutic activity, [id number]- Neuromuscular re-education, 97535- Self Care, [id number]- Manual therapy, [id number]- Gait training, [phone]- Aquatic Therapy, 97014- Electrical stimulation (unattended), [phone]- Electrical stimulation (manual), 97016- Vasopneumatic device, [id number]- Ultrasound, [id number]- Ionotophoresis 4mg /ml Dexamethasone, Patient/Family education, Balance training, Stair training, Taping, Dry Needling, Joint mobilization, Spinal mobilization, Cryotherapy, and Moist heat  PLAN FOR NEXT SESSION:  L knee ROM/flexibility and L LE strengthening; trial of ionto patch vs chondromalacia patella kinesiotaping to R knee if patient shaves   Marry Guan, PT 05/30/2023, 10:37 AM

## 2023-05-31 ENCOUNTER — Ambulatory Visit (INDEPENDENT_AMBULATORY_CARE_PROVIDER_SITE_OTHER): Payer: Medicaid Other | Admitting: Family Medicine

## 2023-05-31 ENCOUNTER — Encounter: Payer: Self-pay | Admitting: Family Medicine

## 2023-05-31 DIAGNOSIS — G8929 Other chronic pain: Secondary | ICD-10-CM | POA: Diagnosis not present

## 2023-05-31 DIAGNOSIS — M25561 Pain in right knee: Secondary | ICD-10-CM

## 2023-05-31 NOTE — Patient Instructions (Addendum)
 No need to wean down on this.   Keep the diet clean and stay active.  Someone will reach out to schedule the MRI.   Send me a message over the weekend reminding me your new insurance is kicked in and to send in the new injectable.    Let us know if you need anything.

## 2023-05-31 NOTE — Progress Notes (Signed)
 Chief Complaint  Patient presents with   Weight Check    Discuss medication/weight    Subjective: Patient is a 43 y.o. male here for f/u obesity.  Patient is currently taking Wegovy 0.25 mg weekly.  He reports compliance with some adverse effects. He is having N/V, sulfur burps, diarrhea, headaches on the medicine. Associated dizziness as well. This was amplified at the 0.5 mg/week dosage. Diet could be better. No exercise.   Patient has continued chronic right knee pain.  No swelling, bruising, redness, or decreased range of motion.  There is no catching or locking.  He is working with physical therapy but it does not seem to be helping.  They have recommended he get an MRI.  He has never had an x-ray.  Past Medical History:  Diagnosis Date   Agatston coronary artery calcium score less than 100    Ca score 1.  No CAD on coronary CTA   Hypertension     Objective: BP (!) 146/82 (BP Location: Left Arm, Patient Position: Sitting)   Pulse 100   Temp 98 F (36.7 C) (Oral)   Resp 16   Ht 5\' 11"  (1.803 m)   Wt 285 lb 3.2 oz (129.4 kg)   SpO2 98%   BMI 39.78 kg/m  General: Awake, appears stated age Heart: RRR, no LE edema Lungs: CTAB, no rales, wheezes or rhonchi. No accessory muscle use MSK: Right knee-normal active and passive range of motion.  No gross deformity.  No effusion or soft tissue swelling.  No ecchymosis or erythema.  There is TTP over the patellar tendon and medial joint line.  Negative Lachman's, varus/valgus stress, patellar apprehension/grind, Stines Psych: Age appropriate judgment and insight, normal affect and mood  Assessment and Plan: Morbid obesity (HCC)  Chronic pain of right knee - Plan: MR Knee Left  Wo Contrast  Chronic, adverse effect of medication.  He has new insurance that starts in 3 days.  He will send me a message in 3 days and remind me to send in Zepbound.  We will stop Wegovy due to side effects.  He may have the same situation with this.  If he  does, we will consider a 90-day trial of phentermine.  Follow-up pending insurance coverage. Chronic, not controlled.  Check MRI.  He has failed physical therapy, conservative modalities like Tylenol, anti-inflammatories, home stretches/exercises.  He has positive joint line tenderness. The patient voiced understanding and agreement to the plan.  Jilda Roche [place], DO 05/31/23  4:18 PM

## 2023-06-12 ENCOUNTER — Ambulatory Visit: Payer: Self-pay

## 2023-06-13 ENCOUNTER — Other Ambulatory Visit: Payer: Medicaid Other

## 2023-06-15 ENCOUNTER — Other Ambulatory Visit: Payer: Self-pay | Admitting: Family Medicine

## 2023-06-15 ENCOUNTER — Other Ambulatory Visit (HOSPITAL_BASED_OUTPATIENT_CLINIC_OR_DEPARTMENT_OTHER): Payer: Self-pay

## 2023-06-15 ENCOUNTER — Ambulatory Visit: Payer: Self-pay | Attending: Family Medicine | Admitting: Physical Therapy

## 2023-06-15 ENCOUNTER — Encounter: Payer: Self-pay | Admitting: Physical Therapy

## 2023-06-15 DIAGNOSIS — R2689 Other abnormalities of gait and mobility: Secondary | ICD-10-CM | POA: Insufficient documentation

## 2023-06-15 DIAGNOSIS — R600 Localized edema: Secondary | ICD-10-CM | POA: Insufficient documentation

## 2023-06-15 DIAGNOSIS — M6281 Muscle weakness (generalized): Secondary | ICD-10-CM | POA: Insufficient documentation

## 2023-06-15 DIAGNOSIS — G8929 Other chronic pain: Secondary | ICD-10-CM | POA: Diagnosis not present

## 2023-06-15 DIAGNOSIS — M25561 Pain in right knee: Secondary | ICD-10-CM | POA: Insufficient documentation

## 2023-06-15 DIAGNOSIS — M25661 Stiffness of right knee, not elsewhere classified: Secondary | ICD-10-CM | POA: Diagnosis not present

## 2023-06-15 MED ORDER — SEMAGLUTIDE-WEIGHT MANAGEMENT 2.4 MG/0.75ML ~~LOC~~ SOAJ
2.4000 mg | SUBCUTANEOUS | 0 refills | Status: DC
  Filled 2023-06-15: qty 3, 28d supply, fill #0

## 2023-06-15 MED ORDER — SEMAGLUTIDE-WEIGHT MANAGEMENT 1.7 MG/0.75ML ~~LOC~~ SOAJ
1.7000 mg | SUBCUTANEOUS | 0 refills | Status: DC
  Filled 2023-06-15: qty 3, 28d supply, fill #0

## 2023-06-15 MED ORDER — SEMAGLUTIDE-WEIGHT MANAGEMENT 0.25 MG/0.5ML ~~LOC~~ SOAJ
0.2500 mg | SUBCUTANEOUS | 0 refills | Status: DC
  Filled 2023-06-15: qty 2, 28d supply, fill #0

## 2023-06-15 MED ORDER — SEMAGLUTIDE-WEIGHT MANAGEMENT 1 MG/0.5ML ~~LOC~~ SOAJ
1.0000 mg | SUBCUTANEOUS | 0 refills | Status: DC
Start: 2023-08-12 — End: 2023-06-21
  Filled 2023-06-15: qty 2, 28d supply, fill #0

## 2023-06-15 MED ORDER — SEMAGLUTIDE-WEIGHT MANAGEMENT 0.5 MG/0.5ML ~~LOC~~ SOAJ
0.5000 mg | SUBCUTANEOUS | 0 refills | Status: DC
  Filled 2023-06-15: qty 2, 28d supply, fill #0

## 2023-06-15 NOTE — Telephone Encounter (Signed)
 Looks like he need new scripts sent in

## 2023-06-15 NOTE — Therapy (Signed)
 OUTPATIENT PHYSICAL THERAPY TREATMENT     Patient Name: Brendan Gregory MRN: [id number] DOB:April 11, 1980, 43 y.o., male Today's Date: 06/15/2023   END OF SESSION:  PT End of Session - 06/15/23 1412     Visit Number 4    Date for PT Re-Evaluation 07/25/23    Authorization Type [employer] of [place] as of 06/03/23    PT Start Time 1407    PT Stop Time 1458    PT Time Calculation (min) 51 min    Activity Tolerance Patient tolerated treatment well    Behavior During Therapy WFL for tasks assessed/performed                Past Medical History:  Diagnosis Date   Agatston coronary artery calcium score less than 100    Ca score 1.  No CAD on coronary CTA   Hypertension    Past Surgical History:  Procedure Laterality Date   MOLE REMOVAL     outpatient procedure   testicular     for undescenced testicle   Patient Active Problem List   Diagnosis Date Noted   Hypokalemia 03/14/2022   Acute cough 03/08/2022   Pansinusitis 03/08/2022   Weakness 03/08/2022   Panic attack 10/25/2021   Generalized anxiety disorder 10/25/2021   Solitary pulmonary nodule 05/04/2021   Agatston coronary artery calcium score less than 100 01/25/2021   Morbid obesity (HCC) 11/24/2020   Obstructive sleep apnea on CPAP 05/16/2019   Essential hypertension 05/16/2019    PCP: Sharlene Dory, DO   REFERRING PROVIDER: Sharlene Dory, DO  REFERRING DIAG: [phone] (ICD-10-CM) - Chronic pain of right knee   THERAPY DIAG:  Chronic pain of right knee  Stiffness of right knee, not elsewhere classified  Other abnormalities of gait and mobility  Muscle weakness (generalized)  Localized edema  RATIONALE FOR EVALUATION AND TREATMENT: Rehabilitation  ONSET DATE: October 2024  NEXT MD VISIT:  10/03/23   SUBJECTIVE:                                                                                                                                                                                                          SUBJECTIVE STATEMENT: Pt reports increased pain after traveling to and working in [place] over since his last PT visit - having to do multiple flights of stairs while at work.  He states he has a MRI ordered but is awaiting prior auth to schedule.  PAIN: Are you having pain? Yes: NPRS scale: 6/10  Pain location: R medial anterior knee/peripatellar Pain description: stiff, locking  up  Aggravating factors: toddler running into knee, sitting in figure-4, walking, squatting, climbing stairs, kneeling/crawling with toddler Relieving factors: long leg distraction, "cracking" or "popping" knee, sometimes elliptical   PERTINENT HISTORY:  HTN, GAD, morbid obesity, OSA  PRECAUTIONS: None  RED FLAGS: None  WEIGHT BEARING RESTRICTIONS: No  FALLS:  Has patient fallen in last 6 months? 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 Patellar tendon normal-appearing Lateral joint line normal-appearing Medial joint line normal-appearing Anterior lateral knee no clear abnormality visible.  However this area is tender to palpation with ultrasound probe. Common fibular nerve normal-appearing nontender to ultrasound probe. IMPRESSION: Largely normal-appearing knee MSK ultrasound.  09/02/21 - DG L knee: FINDINGS: No evidence of fracture, dislocation, or  joint effusion. No evidence of arthropathy or other focal bone abnormality. Soft tissues are unremarkable. IMPRESSION: Negative.  PATIENT SURVEYS:  LEFS 59 / 80 = 73.8 %  COGNITION: Overall cognitive status: Within functional limits for tasks assessed    SENSATION: WFL  EDEMA:  Pt denies, but R knee appears larger than L today.  POSTURE:  Slight genu varus with recurvatum on R>L  PALPATION: TTP over medial plica and inferior medial patella. Decreased/delayed VMO contraction on L quad set.  MUSCLE LENGTH: Hamstrings: mild light L>R ITB: mod tight B Piriformis: WFL Hip flexors: WFL Quads: mild tight  Heelcord: NT  LOWER EXTREMITY ROM:  Active ROM Right eval Left eval R 05/30/23  Knee flexion 113 p! 134 122 p!  Knee extension 0 0 0  (Blank rows = not tested)  LOWER EXTREMITY MMT:  MMT Right eval Left eval R 05/30/23 L 05/30/23  Hip flexion 4- p! 5 4 p! 5  Hip extension 4+ 4+ 4+ 4+  Hip abduction 4 4+ 4 4+  Hip adduction 4+ 4+ 4 4+  Hip internal rotation 4+ 5 4+ p! 5  Hip external rotation 4- p! 4+ 4- p! 4+  Knee flexion 4+ 5 4+ p! 5  Knee extension 4 p! 5 4+ 5  Ankle dorsiflexion 5 5 5 5   Ankle plantarflexion      Ankle inversion      Ankle eversion       (Blank rows = not tested)   TODAY'S TREATMENT:   06/15/23 THERAPEUTIC EXERCISE: To improve strength, endurance, ROM, and flexibility.  Demonstration, verbal and tactile cues throughout for technique.  Rec Bike - L2 x 6 min Hooklying hip ADD ball squeeze between knees + R SAQ 2 x 10 Hooklying hip ADD ball squeeze between ankles + B SAQ 2 x 10 Hooklying hip ADD ball squeeze between knees + bridge 2 x 10 Hooklying R quad set + SLR x 10 Hooklying R quad set + SLR in ER 2 x 10 S/L R hip abduction x 10 - cues to prevent trunk rotation posteriorly and to lift LE with extension bias Seated R hip hinge HS stretch 3 x 30" Seated R  lunge position hip flexor/quad stretch 3 x 30"  NEUROMUSCULAR RE-EDUCATION: To  improve kinesthesia and proprioception. Standing R hip ABD isometric into ball on wall 2 x 10 Standing R hip ER isometric into ball on wall 2 x 10 Standing R TKE isometric into ball on wall 2 x 10 Mini-wall squat + hip ADD ball squeeze 2 x 10  MODALITIES: Ice pack to R knee x 10 minutes   05/30/23 - Recert THERAPEUTIC EXERCISE: To improve strength, endurance, ROM, and flexibility.  Demonstration, verbal and tactile cues throughout for technique.  Seated hip hinge HS stretch + DF for calf stretch 3 x 30" NuStep - L4 x 6 min Hooklying R SLR 2 x 10 - cues for quad set prior to initiation of lift S/L R GTB clam 2 x 10 S/L R hip abduction x 10 - cues to prevent trunk rotation posteriorly and to lift LE with extension bias Supine HS stretch with strap x 30" Supine cross body ITB stretch with strap x 30" Mild Thomas quad/hip flexor stretch with strap x 30"  THERAPEUTIC ACTIVITIES: To improve functional performance.  Demonstration, verbal and tactile cues throughout for technique. ROM & MMT Goal assessment   05/01/23 Manual Therapy: to decrease muscle spasm, pain and improve mobility.  PROM for R knee flexion and ext Therapeutic Exercise: to improve strength and mobility.  Demo, verbal and tactile cues throughout for technique.  Supine HS stretch with strap 2x30" Supine quad sets 5x5" Supine SLR x 10 S/L R hip abduction x 10 S/L clamshells RTB x 15   PATIENT EDUCATION:  Education details: continue with current HEP  Person educated: Patient Education method: Explanation Education comprehension: verbalized understanding  HOME EXERCISE PROGRAM: Access Code: [email or web address] URL: [email or web address] Date: 05/30/2023 Prepared by: Glenetta Hew  Exercises - Seated Long Arc Quad  - 1 x daily - 7 x weekly - 2 sets - 10 reps - Supine Hamstring Stretch with Strap  - 2 x daily - 7 x weekly - 2 sets - 2 reps - 30 sec hold - Supine Iliotibial Band Stretch with Strap  - 2 x daily -  7 x weekly - 3 reps - 30 sec hold - Hip Flexor Stretch with Strap on Table (Mirrored)  - 2 x daily - 7 x weekly - 3 reps - 30 sec hold - Active Straight Leg Raise with Quad Set  - 1 x daily - 7 x weekly - 2 sets - 10 reps - Sidelying Hip Abduction  - 1 x daily - 7 x weekly - 2 sets - 10 reps - Clamshell with Resistance  - 1 x daily - 7 x weekly - 2 sets - 10 reps   ASSESSMENT:  CLINICAL IMPRESSION: Bassam "Josh" returns to PT with increased R knee pain after traveling for work with limited opportunity to complete HEP while traveling.  He reports MD ordered MRI but scheduling pending prior auth.  Progressed strengthening targeting VMO and proximal stability with limited tolerance for some exercises due to pain and pt report of knee stiffness.  Session concluded with ice pack application to reduce post-exercise soreness or edema.  Sharia Reeve will benefit from continued skilled PT to address ongoing deficits and impairments to improve mobility and activity tolerance with decreased pain interference.   OBJECTIVE IMPAIRMENTS: Abnormal gait, decreased activity tolerance, decreased mobility, difficulty walking, decreased ROM, decreased strength, hypomobility, increased edema, increased fascial restrictions, impaired perceived functional ability, increased muscle spasms, impaired flexibility, improper body mechanics, postural dysfunction, and pain.  ACTIVITY LIMITATIONS: bending, squatting, stairs, transfers, locomotion level, and caring for others  PARTICIPATION LIMITATIONS: shopping, community activity, and yard work  PERSONAL FACTORS: Fitness, Past/current experiences, Profession, Time since onset of injury/illness/exacerbation, and 3+ comorbidities: HTN, GAD, morbid obesity, OSA  are also affecting patient's functional outcome.   REHAB POTENTIAL: Good  CLINICAL DECISION MAKING: Stable/uncomplicated  EVALUATION COMPLEXITY: Low   GOALS: Goals reviewed with patient? Yes  SHORT TERM GOALS: Target  date: 05/15/2023, extended to 06/27/2023   Patient will be independent with initial HEP. Baseline:  Goal status: IN PROGRESS - 05/30/23 - clarification necessary for most exercises  2.  Patient will report at least 25% improvement in R knee pain to improve QOL. Baseline: 2/10 currently, 3-4/10 day to day, up to 7-8/10 Goal status: IN PROGRESS - 05/30/23 - pt reporting only 2% improvement  3.  Patient will demonstrate improved R knee AROM to >/= 0-120 deg to allow for normal gait and stair mechanics. Baseline: 0-113 with pain at end ROM flexion Goal status: MET - 05/30/23 - R knee 0-122   LONG TERM GOALS: Target date: 06/05/2023, extended to 07/25/2023  Patient will be independent with advanced/ongoing HEP to improve outcomes and carryover.  Baseline:  Goal status: IN PROGRESS   2.  Patient will report at least 50-75% improvement in R knee pain to improve QOL. Baseline: 2/10 currently, 3-4/10 day to day, up to 7-8/10 Goal status: IN PROGRESS  3.  Patient will demonstrate improved R knee AROM to >/= 0-130 deg to allow for normal gait and stair mechanics. Baseline: 0-113 with pain at end ROM flexion Goal status: IN PROGRESS  4.  Patient will demonstrate improved B LE strength to >/= 4+/5 for improved stability and ease of mobility. Baseline: Refer to above LE MMT table Goal status: IN PROGRESS  5. Patient will be able to ascend/descend stairs with 1 HR and reciprocal step pattern safely to access home and community.  Baseline:  Goal status: IN PROGRESS  6.  Patient will report >/= 68/80 on LEFS (MCID = 9 pts) to demonstrate improved functional ability. Baseline: 59 / 80 = 73.8 % Goal status: IN PROGRESS  7.  Patient will be able to perform a functional squat without limitation due to increased R knee pain or LOM. Baseline:  Goal status: IN PROGRESS    PLAN:  PT FREQUENCY: 1-2x/week  PT DURATION: 8 weeks  PLANNED INTERVENTIONS: 97164- PT Re-evaluation, 97110-Therapeutic  exercises, 97530- Therapeutic activity, [id number]- Neuromuscular re-education, 97535- Self Care, 16109- Manual therapy, [id number]- Gait training, [phone]- Aquatic Therapy, 97014- Electrical stimulation (unattended), [phone]- Electrical stimulation (manual), 97016- Vasopneumatic device, [id number]- Ultrasound, [id number]- Ionotophoresis 4mg /ml Dexamethasone, Patient/Family education, Balance training, Stair training, Taping, Dry Needling, Joint mobilization, Spinal mobilization, Cryotherapy, and Moist heat - Modalities including ionto, estim and vaso not covered by [employer] FOR NEXT SESSION:  L knee ROM/flexibility and L LE strengthening (VMO and proximal emphasis); trial of ionto patch (not covered by BCBS) vs chondromalacia patella kinesiotaping to R knee if patient shaves   Marry Guan, PT 06/15/2023, 2:57 PM

## 2023-06-16 ENCOUNTER — Other Ambulatory Visit (HOSPITAL_BASED_OUTPATIENT_CLINIC_OR_DEPARTMENT_OTHER): Payer: Self-pay

## 2023-06-19 ENCOUNTER — Other Ambulatory Visit (HOSPITAL_COMMUNITY): Payer: Self-pay

## 2023-06-19 ENCOUNTER — Telehealth: Payer: Self-pay | Admitting: Pharmacy Technician

## 2023-06-19 ENCOUNTER — Other Ambulatory Visit (HOSPITAL_BASED_OUTPATIENT_CLINIC_OR_DEPARTMENT_OTHER): Payer: Self-pay

## 2023-06-19 NOTE — Telephone Encounter (Signed)
 Pharmacy Patient Advocate Encounter   Received notification from CoverMyMeds that prior authorization for WEGOVY 0.25 MG is required/requested.   Insurance verification completed.   The patient is insured through [employer]  AS PRIMARY AND UNITED HEALTH MEDICARE AS SECONDARY.   Per test claim: Product/Service Not Covered- Plan/Benefit Exclusion from his Primary therefore the PA will not be submitted.

## 2023-06-21 ENCOUNTER — Encounter: Payer: Self-pay | Admitting: Family Medicine

## 2023-06-21 ENCOUNTER — Telehealth: Payer: Self-pay | Admitting: *Deleted

## 2023-06-21 ENCOUNTER — Ambulatory Visit: Admitting: Family Medicine

## 2023-06-21 VITALS — BP 158/96 | HR 88 | Temp 97.8°F | Ht 71.0 in | Wt 294.8 lb

## 2023-06-21 DIAGNOSIS — G8929 Other chronic pain: Secondary | ICD-10-CM

## 2023-06-21 DIAGNOSIS — I10 Essential (primary) hypertension: Secondary | ICD-10-CM | POA: Diagnosis not present

## 2023-06-21 DIAGNOSIS — M25561 Pain in right knee: Secondary | ICD-10-CM

## 2023-06-21 MED ORDER — TELMISARTAN 80 MG PO TABS: 80.0000 mg | ORAL_TABLET | Freq: Every day | ORAL | 3 refills | Status: DC

## 2023-06-21 NOTE — Telephone Encounter (Signed)
Appointment was scheduled for today

## 2023-06-21 NOTE — Progress Notes (Signed)
 Chief Complaint  Patient presents with   Hypertension    Patient presents today for blood pressure concerns. He reports taking 100 mg of Cozaar twice a daily. He reports headaches.    Subjective Brendan Gregory is a 43 y.o. male who presents for hypertension follow up. He does monitor home blood pressures. Blood pressures ranging from 130-150's/90's on average. He is compliant with medication- losartan 100 mg/d. Patient has these side effects of medication: none He is sometimes adhering to a healthy diet overall. Current exercise: elliptical machine No CP or SOB.    Past Medical History:  Diagnosis Date   Agatston coronary artery calcium score less than 100    Ca score 1.  No CAD on coronary CTA   Hypertension     Exam BP (!) 158/96   Pulse 88   Temp 97.8 F (36.6 C)   Ht 5\' 11"  (1.803 m)   Wt 294 lb 12.8 oz (133.7 kg)   SpO2 96%   BMI 41.12 kg/m  General:  well developed, well nourished, in no apparent distress Heart: RRR, no bruits, no LE edema Lungs: clear to auscultation, no accessory muscle use Psych: well oriented with normal range of affect and appropriate judgment/insight  Essential hypertension - Plan: telmisartan (MICARDIS) 80 MG tablet  Chronic pain of right knee - Plan: MR Knee Right Wo Contrast  Chronic, uncontrolled. Change losartan to telmisartan 80 mg/d. Monitor BP at home. Counseled on diet and exercise. Failed thiazide diuretics and CCB. Will consider BB if not controlled next mo. Reorder MRI for chronic knee pain, suspected meniscal involvement. Refer to 05/31/23 note for more detailed documentation. Pt's ins changed.  F/u in 1 mo. The patient voiced understanding and agreement to the plan.  Jilda Roche [place], DO 06/21/23  2:10 PM

## 2023-06-21 NOTE — Telephone Encounter (Signed)
 Who Is Calling Patient / Member / Family / Caregiver Call Type Triage / Clinical Relationship To Patient Self Return Phone Number [phone] (Primary) Chief Complaint Headache Reason for Call Symptomatic / Request for Health Information Initial Comment Caller states he has an appt for high BP he had last night, unsure of the reading. Stopped Reginal Lutes recently and has been having BP issues since. Thinks it may be elevated but states he can manage it, wondering if he can take another dosage before his next appt. He has a headache and flushed cheeks that's how he knows it's high. Translation No Nurse Assessment Nurse: Noelle Penner, RN, [place] Date/Time (Eastern Time): 06/21/2023 12:11:04 PM Confirm and document reason for call. If symptomatic, describe symptoms. ---Caller states has appt today for high BP. Not able to check BP now. Does the patient have any new or worsening symptoms? ---Yes Will a triage be completed? ---Yes Related visit to physician within the last 2 weeks? ---No Does the PT have any chronic conditions? (i.e. diabetes, asthma, this includes High risk factors for pregnancy, etc.) ---No Is this a behavioral health or substance abuse call? ---No Guidelines Guideline Title Affirmed Question Affirmed Notes Nurse Date/Time (Eastern Time) Blood Pressure - High [1] Taking BP medications AND [2] feels is having side effects (e.g., impotence, cough, dizzy upon standing) Noelle Penner, RN, [place] 06/21/2023 12:11:21 PM PLEASE NOTE: All timestamps contained within this report are represented as [place] Standard Time. CONFIDENTIALTY NOTICE: This fax transmission is intended only for the addressee. It contains information that is legally privileged, confidential or otherwise protected from use or disclosure. If you are not the intended recipient, you are strictly prohibited from reviewing, disclosing, copying using or disseminating any of this information or taking any action in  reliance on or regarding this information. If you have received this fax in error, please notify us immediately by telephone so that we can arrange for its return to [place]. Phone: [phone], Toll-Free: [phone], Fax: [phone] [email or web address] 02-02-1981 Page: 2 of 2 CallId: [id number] Disp. Time Lamount Cohen Time) Disposition Final User 06/21/2023 11:56:43 AM Attempt made - no message left Noelle Penner, RN, [place] 06/21/2023 12:10:23 PM Attempt made - no message left [place] Cerise 06/21/2023 12:15:34 PM See HCP within 4 Hours (or PCP triage) Radonna Ricker, RN, [place] Final Disposition 06/21/2023 12:15:34 PM See HCP within 4 Hours (or PCP triage) Yes Noelle Penner, RN, [place] Disposition Overriden: SEE PCP WITHIN 3 DAYS Override Reason: Patient's symptoms need a higher level of care Caller Disagree/Comply Comply Caller Understands Yes PreDisposition InappropriateToAsk Care Advice Given Per Guideline SEE PCP WITHIN 3 DAYS: * You need to be seen within 2 or 3 days. CALL BACK IF: * Weakness or numbness of the face, arm or leg on one side of the body occurs * Difficulty walking, difficulty talking, or severe headache occurs * Chest pain or difficulty breathing occurs * You become worse CARE ADVICE given per Blood Pressure - High (Adult) guideline. Comments User: Marco Collie, RN Date/Time Lamount Cohen Time): 06/21/2023 12:13:45 PM Upgraded triage due to pt not knowing BP. Referrals REFERRED TO PCP OFFICE

## 2023-06-21 NOTE — Patient Instructions (Signed)
Keep the diet clean and stay active.  Check your blood pressures 2-3 times per week, alternating the time of day you check it. If it is high, considering waiting 1-2 minutes and rechecking. If it gets higher, your anxiety is likely creeping up and we should avoid rechecking.   I want your blood pressure less than 140 on the top and less than 90 on the bottom consistently. Both goals must be met (ie, 150/70 is too high even though the 70 on the bottom is desirable).   Let us know if you need anything.

## 2023-06-22 ENCOUNTER — Ambulatory Visit: Payer: 59 | Admitting: Physical Therapy

## 2023-06-22 ENCOUNTER — Encounter: Payer: Self-pay | Admitting: Physical Therapy

## 2023-06-22 ENCOUNTER — Telehealth: Payer: Self-pay | Admitting: Family Medicine

## 2023-06-22 ENCOUNTER — Other Ambulatory Visit (HOSPITAL_BASED_OUTPATIENT_CLINIC_OR_DEPARTMENT_OTHER): Payer: Self-pay

## 2023-06-22 DIAGNOSIS — R600 Localized edema: Secondary | ICD-10-CM | POA: Diagnosis not present

## 2023-06-22 DIAGNOSIS — M6281 Muscle weakness (generalized): Secondary | ICD-10-CM | POA: Diagnosis not present

## 2023-06-22 DIAGNOSIS — G8929 Other chronic pain: Secondary | ICD-10-CM

## 2023-06-22 DIAGNOSIS — R2689 Other abnormalities of gait and mobility: Secondary | ICD-10-CM

## 2023-06-22 DIAGNOSIS — M25661 Stiffness of right knee, not elsewhere classified: Secondary | ICD-10-CM

## 2023-06-22 DIAGNOSIS — M25561 Pain in right knee: Secondary | ICD-10-CM | POA: Diagnosis not present

## 2023-06-22 DIAGNOSIS — I10 Essential (primary) hypertension: Secondary | ICD-10-CM

## 2023-06-22 NOTE — Telephone Encounter (Signed)
 Pt needs medication telmisartan sent to [facility] at [address] in [place]. Pt's rx was [facility] into [facility] but insurance does not cover please remove as a option per patient. Pt wants to be called or messaged when rx is changed to [facility] pharmacy.

## 2023-06-22 NOTE — Therapy (Signed)
 OUTPATIENT PHYSICAL THERAPY TREATMENT     Patient Name: Brendan Gregory MRN: [id number] DOB:[birth year], 43 y.o., male Today's Date: 06/22/2023   END OF SESSION:  PT End of Session - 06/22/23 1401     Visit Number 5    Date for PT Re-Evaluation 07/25/23    Authorization Type [employer] of [place] as of 06/03/23    PT Start Time 1401    PT Stop Time 1449    PT Time Calculation (min) 48 min    Activity Tolerance Patient tolerated treatment well    Behavior During Therapy WFL for tasks assessed/performed                 Past Medical History:  Diagnosis Date   Agatston coronary artery calcium score less than 100    Ca score 1.  No CAD on coronary CTA   Hypertension    Past Surgical History:  Procedure Laterality Date   MOLE REMOVAL     outpatient procedure   testicular     for undescenced testicle   Patient Active Problem List   Diagnosis Date Noted   Hypokalemia 03/14/2022   Acute cough 03/08/2022   Pansinusitis 03/08/2022   Weakness 03/08/2022   Panic attack 10/25/2021   Generalized anxiety disorder 10/25/2021   Solitary pulmonary nodule 05/04/2021   Agatston coronary artery calcium score less than 100 01/25/2021   Morbid obesity (HCC) 11/24/2020   Obstructive sleep apnea on CPAP 05/16/2019   Essential hypertension 05/16/2019    PCP: Sharlene Dory, DO   REFERRING PROVIDER: Sharlene Dory, DO  REFERRING DIAG: [phone] (ICD-10-CM) - Chronic pain of right knee   THERAPY DIAG:  Chronic pain of right knee  Stiffness of right knee, not elsewhere classified  Other abnormalities of gait and mobility  Muscle weakness (generalized)  Localized edema  RATIONALE FOR EVALUATION AND TREATMENT: Rehabilitation  ONSET DATE: October 2024  NEXT MD VISIT:  07/19/23   SUBJECTIVE:                                                                                                                                                                                                          SUBJECTIVE STATEMENT: Pt reports increased pain in his R knee following MD testing patellar tendon relfex at his MD visit yesterday.  He notes improving tolerance for stair negotiation and the elliptical.  PAIN: Are you having pain? Yes: NPRS scale: 0/10 at rest; with NuStep 3/10  Pain location: R medial anterior knee/peripatellar Pain description: stiff, locking up  Aggravating factors: toddler running into  knee, sitting in figure-4, walking, squatting, climbing stairs, kneeling/crawling with toddler Relieving factors: long leg distraction, "cracking" or "popping" knee, sometimes elliptical   PERTINENT HISTORY:  HTN, GAD, morbid obesity, OSA  PRECAUTIONS: None  RED FLAGS: None  WEIGHT BEARING RESTRICTIONS: No  FALLS:  Has patient fallen in last 6 months? 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 Patellar tendon normal-appearing Lateral joint line normal-appearing Medial joint line normal-appearing Anterior lateral knee no clear abnormality visible.  However this area is tender to palpation with ultrasound probe. Common fibular nerve normal-appearing nontender to ultrasound probe. IMPRESSION: Largely normal-appearing knee MSK ultrasound.  09/02/21 - DG L knee: FINDINGS: No evidence of fracture, dislocation, or joint  effusion. No evidence of arthropathy or other focal bone abnormality. Soft tissues are unremarkable. IMPRESSION: Negative.  PATIENT SURVEYS:  LEFS 59 / 80 = 73.8 %  COGNITION: Overall cognitive status: Within functional limits for tasks assessed    SENSATION: WFL  EDEMA:  Pt denies, but R knee appears larger than L today.  POSTURE:  Slight genu varus with recurvatum on R>L  PALPATION: TTP over medial plica and inferior medial patella. Decreased/delayed VMO contraction on L quad set.  MUSCLE LENGTH: Hamstrings: mild light L>R ITB: mod tight B Piriformis: WFL Hip flexors: WFL Quads: mild tight  Heelcord: NT  LOWER EXTREMITY ROM:  Active ROM Right eval Left eval R 05/30/23  Knee flexion 113 p! 134 122 p!  Knee extension 0 0 0  (Blank rows = not tested)  LOWER EXTREMITY MMT:  MMT Right eval Left eval R 05/30/23 L 05/30/23  Hip flexion 4- p! 5 4 p! 5  Hip extension 4+ 4+ 4+ 4+  Hip abduction 4 4+ 4 4+  Hip adduction 4+ 4+ 4 4+  Hip internal rotation 4+ 5 4+ p! 5  Hip external rotation 4- p! 4+ 4- p! 4+  Knee flexion 4+ 5 4+ p! 5  Knee extension 4 p! 5 4+ 5  Ankle dorsiflexion 5 5 5 5   Ankle plantarflexion      Ankle inversion      Ankle eversion       (Blank rows = not tested)   TODAY'S TREATMENT:   06/22/23 THERAPEUTIC EXERCISE: To improve strength and endurance.  Demonstration, verbal and tactile cues throughout for technique.  NuStep - L5 x 6 min  NEUROMUSCULAR RE-EDUCATION: To improve balance, coordination, kinesthesia, and proprioception. Standing R TKE isometric into ball on wall 2 x 10 Standing R blue TB TKE isometric 2 x 10 - cues to maintain neutral hip/trunk, focusing quad and glute activation Wall squat + GTB hip ABD isometric - deferred d/t increased R knee pain Resisted side-stepping with looped GTB at distal thighs - deferred d/t increased R knee pain Fwd & back monster walk with looped GTB at ankles 2 x 25 ft  MODALITIES: Game Ready  vasopneumatic compression post session to R knee x 10  min, medium compression, 34 to reduce post-exercise pain and swelling/edema   06/15/23 THERAPEUTIC EXERCISE: To improve strength, endurance, ROM, and flexibility.  Demonstration, verbal and tactile cues throughout for technique.  Rec Bike - L2 x 6 min Hooklying hip ADD ball squeeze between knees + R SAQ 2 x 10 Hooklying hip ADD ball squeeze between ankles + B SAQ 2 x 10 Hooklying hip ADD ball squeeze between knees + bridge 2 x 10 Hooklying R quad set + SLR x 10 Hooklying R quad set + SLR in ER 2 x 10 S/L R hip abduction x 10 - cues to prevent trunk rotation posteriorly and to lift LE with extension bias Seated R hip hinge HS stretch 3 x 30" Seated R lunge position hip flexor/quad stretch 3 x 30"  NEUROMUSCULAR RE-EDUCATION: To improve kinesthesia and proprioception. Standing R hip ABD isometric into ball on wall 2 x 10 Standing R hip ER isometric into ball on wall 2 x 10 Standing R TKE isometric into ball on wall 2 x 10 Mini-wall squat + hip ADD ball squeeze 2 x 10  MODALITIES: Ice pack to R knee x 10 minutes   05/30/23 - Recert THERAPEUTIC EXERCISE: To improve strength, endurance, ROM, and flexibility.  Demonstration, verbal and tactile cues throughout for technique.  Seated hip hinge HS stretch + DF for calf stretch 3 x 30" NuStep - L4 x 6 min Hooklying R SLR 2 x 10 - cues for quad set prior to initiation of lift S/L R GTB clam 2 x 10 S/L R hip abduction x 10 - cues to prevent trunk rotation posteriorly and to lift LE with extension bias Supine HS stretch with strap x 30" Supine cross body ITB stretch with strap x 30" Mild Thomas quad/hip flexor stretch with strap x 30"  THERAPEUTIC ACTIVITIES: To improve functional performance.  Demonstration, verbal and tactile cues throughout for technique. ROM & MMT Goal assessment   PATIENT EDUCATION:  Education details: HEP review and HEP update  Person educated:  Patient Education method: Explanation, Demonstration, Verbal cues, Handouts, and MedBridgeGO app access provided Education comprehension: verbalized understanding, returned demonstration, verbal cues required, and needs further education  HOME EXERCISE PROGRAM: *Pt provided MedBridge[employer] app access.  Access Code: [facility] URL: [email or web address] Date: 06/22/2023 Prepared by: Glenetta Hew  Exercises - Seated Long Arc Quad  - 1 x daily - 7 x weekly - 2 sets - 10 reps - Supine Hamstring Stretch with Strap  - 2 x daily - 7 x weekly - 2 sets - 2 reps - 30 sec hold - Supine Iliotibial Band Stretch with Strap  - 2 x daily - 7 x weekly - 3 reps - 30 sec hold - Hip Flexor Stretch with Strap on Table (Mirrored)  - 2 x daily - 7 x weekly - 3 reps - 30 sec hold - Active Straight Leg Raise with Quad Set  - 1 x daily - 7 x weekly - 2 sets - 10 reps - Sidelying Hip Abduction  - 1 x daily - 7 x weekly - 2 sets - 10 reps - Clamshell with Resistance  - 1 x daily - 7 x weekly - 2 sets - 10 reps - Standing Terminal Knee Extension at Wall with Ball  - 1 x daily - 7 x weekly - 2 sets - 10 reps - 5 sec hold - Standing Terminal Knee Extension with Resistance  - 1 x daily - 7 x weekly - 2 sets - 10 reps -  5 sec hold - Band Walks  - 1 x daily - 7 x weekly - 2 sets - 10 reps   ASSESSMENT:  CLINICAL IMPRESSION: Brendan "Brendan Gregory" reports increased R knee pain since MD performed reflex testing on his patellar tendon at office visit yesterday.  Attempted to progress strengthening in standing but limited tolerance due to ongoing irritability since MD visit but able to progress TKE and lateral hip strengthening with monster walks w/o increased pain.  HEP updated to reflect exercise progression.  Session concluded with application of vasopneumatic compression to reduce post exercise irritation and edema with patient noting good relief.  Brendan Gregory will benefit from continued skilled PT to address ongoing deficits and  impairments to improve mobility and activity tolerance with decreased pain interference.   OBJECTIVE IMPAIRMENTS: Abnormal gait, decreased activity tolerance, decreased mobility, difficulty walking, decreased ROM, decreased strength, hypomobility, increased edema, increased fascial restrictions, impaired perceived functional ability, increased muscle spasms, impaired flexibility, improper body mechanics, postural dysfunction, and pain.   ACTIVITY LIMITATIONS: bending, squatting, stairs, transfers, locomotion level, and caring for others  PARTICIPATION LIMITATIONS: shopping, community activity, and yard work  PERSONAL FACTORS: Fitness, Past/current experiences, Profession, Time since onset of injury/illness/exacerbation, and 3+ comorbidities: HTN, GAD, morbid obesity, OSA  are also affecting patient's functional outcome.   REHAB POTENTIAL: Good  CLINICAL DECISION MAKING: Stable/uncomplicated  EVALUATION COMPLEXITY: Low   GOALS: Goals reviewed with patient? Yes  SHORT TERM GOALS: Target date: 05/15/2023, extended to 06/27/2023   Patient will be independent with initial HEP. Baseline:  Goal status: IN PROGRESS - 05/30/23 - clarification necessary for most exercises  2.  Patient will report at least 25% improvement in R knee pain to improve QOL. Baseline: 2/10 currently, 3-4/10 day to day, up to 7-8/10 Goal status: IN PROGRESS - 05/30/23 - pt reporting only 2% improvement  3.  Patient will demonstrate improved R knee AROM to >/= 0-120 deg to allow for normal gait and stair mechanics. Baseline: 0-113 with pain at end ROM flexion Goal status: MET - 05/30/23 - R knee 0-122   LONG TERM GOALS: Target date: 06/05/2023, extended to 07/25/2023  Patient will be independent with advanced/ongoing HEP to improve outcomes and carryover.  Baseline:  Goal status: IN PROGRESS   2.  Patient will report at least 50-75% improvement in R knee pain to improve QOL. Baseline: 2/10 currently, 3-4/10 day to  day, up to 7-8/10 Goal status: IN PROGRESS  3.  Patient will demonstrate improved R knee AROM to >/= 0-130 deg to allow for normal gait and stair mechanics. Baseline: 0-113 with pain at end ROM flexion Goal status: IN PROGRESS  4.  Patient will demonstrate improved B LE strength to >/= 4+/5 for improved stability and ease of mobility. Baseline: Refer to above LE MMT table Goal status: IN PROGRESS  5. Patient will be able to ascend/descend stairs with 1 HR and reciprocal step pattern safely to access home and community.  Baseline:  Goal status: IN PROGRESS  6.  Patient will report >/= 68/80 on LEFS (MCID = 9 pts) to demonstrate improved functional ability. Baseline: 59 / 80 = 73.8 % Goal status: IN PROGRESS  7.  Patient will be able to perform a functional squat without limitation due to increased R knee pain or LOM. Baseline:  Goal status: IN PROGRESS    PLAN:  PT FREQUENCY: 1-2x/week  PT DURATION: 8 weeks  PLANNED INTERVENTIONS: 97164- PT Re-evaluation, 97110-Therapeutic exercises, 97530- Therapeutic activity, [id number]- Neuromuscular re-education, 97535- Self Care, [id number]-  Manual therapy, [id number]- Gait training, 29562- Aquatic Therapy, [phone]- Electrical stimulation (unattended), [phone]- Electrical stimulation (manual), [id number]- Vasopneumatic device, [id number]- Ultrasound, [id number]- Ionotophoresis 4mg /ml Dexamethasone, Patient/Family education, Balance training, Stair training, Taping, Dry Needling, Joint mobilization, Spinal mobilization, Cryotherapy, and Moist heat - Modalities including ionto, estim and vaso not covered by [employer]  PLAN FOR NEXT SESSION: STG assessment; L knee ROM/flexibility and L LE strengthening (VMO and proximal emphasis); trial of ionto patch (not covered by [employer]) vs chondromalacia patella kinesiotaping to R knee if patient shaves   Marry Guan, PT 06/22/2023, 8:12 PM

## 2023-06-23 ENCOUNTER — Telehealth: Payer: Self-pay | Admitting: Family Medicine

## 2023-06-23 ENCOUNTER — Other Ambulatory Visit: Payer: Self-pay | Admitting: Family Medicine

## 2023-06-23 DIAGNOSIS — G8929 Other chronic pain: Secondary | ICD-10-CM

## 2023-06-23 MED ORDER — TELMISARTAN 80 MG PO TABS: 80.0000 mg | ORAL_TABLET | Freq: Every day | ORAL | 3 refills | Status: DC

## 2023-06-23 NOTE — Telephone Encounter (Signed)
 Medication sent to [facility]. [facility]. Patient made aware.

## 2023-06-23 NOTE — Telephone Encounter (Signed)
 Pt.notified

## 2023-06-23 NOTE — Telephone Encounter (Signed)
 Plz let pt know I spoke w insurance and they won't pay for MRI without an X-ray. I ordered one to be done at the [facility]. Let me know if that's an issue. Does not need an appt. Ty.

## 2023-06-26 ENCOUNTER — Ambulatory Visit: Payer: 59

## 2023-06-29 ENCOUNTER — Ambulatory Visit

## 2023-06-29 ENCOUNTER — Ambulatory Visit: Payer: 59 | Admitting: Physical Therapy

## 2023-06-29 ENCOUNTER — Encounter: Payer: Self-pay | Admitting: Physical Therapy

## 2023-06-29 DIAGNOSIS — M6281 Muscle weakness (generalized): Secondary | ICD-10-CM

## 2023-06-29 DIAGNOSIS — R600 Localized edema: Secondary | ICD-10-CM

## 2023-06-29 DIAGNOSIS — R2689 Other abnormalities of gait and mobility: Secondary | ICD-10-CM | POA: Diagnosis not present

## 2023-06-29 DIAGNOSIS — G8929 Other chronic pain: Secondary | ICD-10-CM | POA: Diagnosis not present

## 2023-06-29 DIAGNOSIS — M25561 Pain in right knee: Secondary | ICD-10-CM

## 2023-06-29 DIAGNOSIS — M25461 Effusion, right knee: Secondary | ICD-10-CM | POA: Diagnosis not present

## 2023-06-29 DIAGNOSIS — M25661 Stiffness of right knee, not elsewhere classified: Secondary | ICD-10-CM
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
## 2023-07-03 ENCOUNTER — Ambulatory Visit: Payer: 59 | Admitting: Physical Therapy

## 2023-07-03 DIAGNOSIS — G8929 Other chronic pain: Secondary | ICD-10-CM

## 2023-07-03 DIAGNOSIS — R600 Localized edema: Secondary | ICD-10-CM

## 2023-07-03 DIAGNOSIS — M25661 Stiffness of right knee, not elsewhere classified: Secondary | ICD-10-CM

## 2023-07-03 DIAGNOSIS — R2689 Other abnormalities of gait and mobility: Secondary | ICD-10-CM | POA: Diagnosis not present

## 2023-07-03 DIAGNOSIS — M6281 Muscle weakness (generalized): Secondary | ICD-10-CM | POA: Diagnosis not present

## 2023-07-03 DIAGNOSIS — M25561 Pain in right knee: Secondary | ICD-10-CM | POA: Diagnosis not present

## 2023-07-03 NOTE — Therapy (Signed)
 OUTPATIENT PHYSICAL THERAPY TREATMENT     Patient Name: Brendan Gregory MRN: [id number] DOB:06/27/1980, 43 y.o., male Today's Date: 07/03/2023   END OF SESSION:  PT End of Session - 07/03/23 1447     Visit Number 7    Date for PT Re-Evaluation 07/25/23    Authorization Type [employer] of [place] as of 06/03/23    PT Start Time 1447    PT Stop Time 1525    PT Time Calculation (min) 38 min    Activity Tolerance Patient tolerated treatment well    Behavior During Therapy WFL for tasks assessed/performed                   Past Medical History:  Diagnosis Date   Agatston coronary artery calcium score less than 100    Ca score 1.  No CAD on coronary CTA   Hypertension    Past Surgical History:  Procedure Laterality Date   MOLE REMOVAL     outpatient procedure   testicular     for undescenced testicle   Patient Active Problem List   Diagnosis Date Noted   Hypokalemia 03/14/2022   Acute cough 03/08/2022   Pansinusitis 03/08/2022   Weakness 03/08/2022   Panic attack 10/25/2021   Generalized anxiety disorder 10/25/2021   Solitary pulmonary nodule 05/04/2021   Agatston coronary artery calcium score less than 100 01/25/2021   Morbid obesity (HCC) 11/24/2020   Obstructive sleep apnea on CPAP 05/16/2019   Essential hypertension 05/16/2019    PCP: Sharlene Dory, DO   REFERRING PROVIDER: Sharlene Dory, DO  REFERRING DIAG: [phone] (ICD-10-CM) - Chronic pain of right knee   THERAPY DIAG:  Chronic pain of right knee  Stiffness of right knee, not elsewhere classified  Other abnormalities of gait and mobility  Muscle weakness (generalized)  Localized edema  RATIONALE FOR EVALUATION AND TREATMENT: Rehabilitation  ONSET DATE: October 2024  NEXT MD VISIT:  07/19/23   SUBJECTIVE:                                                                                                                                                                                                          SUBJECTIVE STATEMENT: Pt reports a lot of R medial soreness. Thinks he has pin pointed how he hurts his knee -- feels it may be due to his sitting posture while at work (sits in figure 4). Reports he has a lot of anterior/medial knee pain this weekend and has not been able to go to the gym.   PAIN: Are you  having pain? Yes: NPRS scale: 4-5/10  Pain location: R medial anterior knee/peripatellar Pain description: stiff, locking up  Aggravating factors: toddler running into knee, sitting in figure-4, walking, squatting, climbing stairs, kneeling/crawling with toddler Relieving factors: long leg distraction, "cracking" or "popping" knee, sometimes elliptical   PERTINENT HISTORY:  HTN, GAD, morbid obesity, OSA  PRECAUTIONS: None  RED FLAGS: None  WEIGHT BEARING RESTRICTIONS: No  FALLS:  Has patient fallen in last 6 months? 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 Patellar tendon normal-appearing Lateral joint line normal-appearing Medial joint line normal-appearing Anterior lateral knee no clear abnormality visible.  However this area is tender to palpation with ultrasound probe. Common fibular nerve normal-appearing  nontender to ultrasound probe. IMPRESSION: Largely normal-appearing knee MSK ultrasound.  09/02/21 - DG L knee: FINDINGS: No evidence of fracture, dislocation, or joint effusion. No evidence of arthropathy or other focal bone abnormality. Soft tissues are unremarkable. IMPRESSION: Negative.  PATIENT SURVEYS:  LEFS 59 / 80 = 73.8 %  COGNITION: Overall cognitive status: Within functional limits for tasks assessed    SENSATION: WFL  EDEMA:  Pt denies, but R knee appears larger than L today.  POSTURE:  Slight genu varus with recurvatum on R>L  PALPATION: TTP over medial plica and inferior medial patella. Decreased/delayed VMO contraction on L quad set.  MUSCLE LENGTH: Hamstrings: mild light L>R ITB: mod tight B Piriformis: WFL Hip flexors: WFL Quads: mild tight  Heelcord: NT  LOWER EXTREMITY ROM:  Active ROM Right eval Left eval R 05/30/23  Knee flexion 113 p! 134 122 p!  Knee extension 0 0 0  (Blank rows = not tested)  LOWER EXTREMITY MMT:  MMT Right eval Left eval R 05/30/23 L 05/30/23  Hip flexion 4- p! 5 4 p! 5  Hip extension 4+ 4+ 4+ 4+  Hip abduction 4 4+ 4 4+  Hip adduction 4+ 4+ 4 4+  Hip internal rotation 4+ 5 4+ p! 5  Hip external rotation 4- p! 4+ 4- p! 4+  Knee flexion 4+ 5 4+ p! 5  Knee extension 4 p! 5 4+ 5  Ankle dorsiflexion 5 5 5 5   Ankle plantarflexion      Ankle inversion      Ankle eversion       (Blank rows = not tested)   TODAY'S TREATMENT:  07/03/23 THERAPEUTIC EXERCISE NuStep - L6 x 4 min - limited due to pain Prone quad stretch 2x 30" Prone quad set 2x10x3"  NEUROMUSCULAR RE-ED SLR in slight ER 2x10 Bridge + ball squeeze 2x10, knees slightly extended x10 Prone hip ext + knee flexion 2x10 Prone hip IR 2x10 SL heel raise 2x10 Glute max iso into step 2x10 Glute med iso into wall 2x10  MODALITIES: Game Ready vasopneumatic compression post session to R  knee x 10 min, high compression, 34 to reduce post-exercise pain and  swelling/edema   06/29/23 THERAPEUTIC EXERCISE: To improve strength and endurance.  Demonstration, verbal and tactile cues throughout for technique.  NuStep - L5 x 6 min  MANUAL THERAPY: To promote increased ROM, reduced pain, and improved patellar alignment  utilizing kinesiotaping.  Ktape to R knee - chondromalacia patella pattern with slight medial patellar glide on perpendicular strip  NEUROMUSCULAR RE-EDUCATION: To improve balance, coordination, kinesthesia, posture, and proprioception. Mini-wall squat + hip ADD ball squeeze 2 x 10 R 6"  lateral eccentric lowering with light heel touch to floor 2 x 10 - 1-2 pole assist for balance  R 4" fwd eccentric lowering x 5 - limited by pain R SLS + 5-way clock to colored dots x 6 cycles Fitter (1 black/1 blue) hip abduction x 20, 2 pole A for balance initially weaning to no support Fitter (1 black/1 blue) hip extension (rear foot on near edge) x 10, 2 pole A for balance - cues to prevent fwd knee from passing in front of toes TRX squat 2 x 10  MODALITIES: Game Ready vasopneumatic compression post session to R knee x 10 min, high compression, 34 to reduce post-exercise pain and swelling/edema   06/22/23 THERAPEUTIC EXERCISE: To improve strength and endurance.  Demonstration, verbal and tactile cues throughout for technique.  NuStep - L5 x 6 min  NEUROMUSCULAR RE-EDUCATION: To improve balance, coordination, kinesthesia, and proprioception. Standing R TKE isometric into ball on wall 2 x 10 Standing R blue TB TKE isometric 2 x 10 - cues to maintain neutral hip/trunk, focusing quad and glute activation Wall squat + GTB hip ABD isometric - deferred d/t increased R knee pain Resisted side-stepping with looped GTB at distal thighs - deferred d/t increased R knee pain Fwd & back monster walk with looped GTB at ankles 2 x 25 ft  MODALITIES: Game Ready vasopneumatic compression post session to R knee x 10 min, medium compression, 34 to reduce  post-exercise pain and swelling/edema   06/15/23 THERAPEUTIC EXERCISE: To improve strength, endurance, ROM, and flexibility.  Demonstration, verbal and tactile cues throughout for technique.  Rec Bike - L2 x 6 min Hooklying hip ADD ball squeeze between knees + R SAQ 2 x 10 Hooklying hip ADD ball squeeze between ankles + B SAQ 2 x 10 Hooklying hip ADD ball squeeze between knees + bridge 2 x 10 Hooklying R quad set + SLR x 10 Hooklying R quad set + SLR in ER 2 x 10 S/L R hip abduction x 10 - cues to prevent trunk rotation posteriorly and to lift LE with extension bias Seated R hip hinge HS stretch 3 x 30" Seated R lunge position hip flexor/quad stretch 3 x 30"  NEUROMUSCULAR RE-EDUCATION: To improve kinesthesia and proprioception. Standing R hip ABD isometric into ball on wall 2 x 10 Standing R hip ER isometric into ball on wall 2 x 10 Standing R TKE isometric into ball on wall 2 x 10 Mini-wall squat + hip ADD ball squeeze 2 x 10  MODALITIES: Ice pack to R knee x 10 minutes   05/30/23 - Recert THERAPEUTIC EXERCISE: To improve strength, endurance, ROM, and flexibility.  Demonstration, verbal and tactile cues throughout for technique.  Seated hip hinge HS stretch + DF for calf stretch 3 x 30" NuStep - L4 x 6 min Hooklying R SLR 2 x 10 - cues for quad set prior to initiation of lift S/L R GTB clam 2  x 10 S/L R hip abduction x 10 - cues to prevent trunk rotation posteriorly and to lift LE with extension bias Supine HS stretch with strap x 30" Supine cross body ITB stretch with strap x 30" Mild Thomas quad/hip flexor stretch with strap x 30"  THERAPEUTIC ACTIVITIES: To improve functional performance.  Demonstration, verbal and tactile cues throughout for technique. ROM & MMT Goal assessment   PATIENT EDUCATION:  Education details: continue with current HEP  Person educated: Patient Education method: Explanation Education comprehension: verbalized understanding  HOME EXERCISE  PROGRAM: *Pt provided [email or web address] app access.  Access Code: [facility] URL: [email or web address] Date: 07/03/2023 Prepared by: Vernon Prey April Kirstie Peri  Exercises - Supine Hamstring Stretch with Strap  - 2 x daily - 7 x weekly - 2 sets - 2 reps - 30 sec hold - Supine Iliotibial Band Stretch with Strap  - 2 x daily - 7 x weekly - 3 reps - 30 sec hold - Active Straight Leg Raise with Quad Set  - 1 x daily - 7 x weekly - 2 sets - 10 reps - Prone Quadriceps Stretch with Strap  - 1 x daily - 7 x weekly - 2 sets - 30 sec hold - Clamshell with Resistance  - 1 x daily - 7 x weekly - 2 sets - 10 reps - Standing Terminal Knee Extension with Resistance  - 1 x daily - 7 x weekly - 2 sets - 10 reps - 5 sec hold - Band Walks  - 1 x daily - 7 x weekly - 2 sets - 10 reps - Wall Squat Hold with Ball  - 1 x daily - 7 x weekly - 2 sets - 10 reps - Isometric Gluteus Medius at Wall  - 1 x daily - 7 x weekly - 2 sets - 10 reps - 3 sec hold - Step Up  - 1 x daily - 7 x weekly - 2 sets - 10 reps   ASSESSMENT:  CLINICAL IMPRESSION: Pt reports good response to taping. Knee is currently more aggravated from this weekend so deferred from too much standing weight bearing and squats. Worked on increasing knee stability in standing without valgus/varus force and increasing glute activation vs quad activation. Continued work on improving quad eccentric control.   OBJECTIVE IMPAIRMENTS: Abnormal gait, decreased activity tolerance, decreased mobility, difficulty walking, decreased ROM, decreased strength, hypomobility, increased edema, increased fascial restrictions, impaired perceived functional ability, increased muscle spasms, impaired flexibility, improper body mechanics, postural dysfunction, and pain.   ACTIVITY LIMITATIONS: bending, squatting, stairs, transfers, locomotion level, and caring for others  PARTICIPATION LIMITATIONS: shopping, community activity, and yard work  PERSONAL FACTORS:  Fitness, Past/current experiences, Profession, Time since onset of injury/illness/exacerbation, and 3+ comorbidities: HTN, GAD, morbid obesity, OSA  are also affecting patient's functional outcome.   REHAB POTENTIAL: Good  CLINICAL DECISION MAKING: Stable/uncomplicated  EVALUATION COMPLEXITY: Low   GOALS: Goals reviewed with patient? Yes  SHORT TERM GOALS: Target date: 05/15/2023, extended to 06/27/2023   Patient will be independent with initial HEP. Baseline:  Goal status: MET - 06/29/23   2.  Patient will report at least 25% improvement in R knee pain to improve QOL. Baseline: 2/10 currently, 3-4/10 day to day, up to 7-8/10 Goal status: IN PROGRESS - 06/29/23 - pt reporting 5-10% improvement overall  3.  Patient will demonstrate improved R knee AROM to >/= 0-120 deg to allow for normal gait and stair mechanics. Baseline: 0-113 with pain at end ROM flexion Goal status:  MET - 05/30/23 - R knee 0-122   LONG TERM GOALS: Target date: 06/05/2023, extended to 07/25/2023  Patient will be independent with advanced/ongoing HEP to improve outcomes and carryover.  Baseline:  Goal status: IN PROGRESS   2.  Patient will report at least 50-75% improvement in R knee pain to improve QOL. Baseline: 2/10 currently, 3-4/10 day to day, up to 7-8/10 Goal status: IN PROGRESS  3.  Patient will demonstrate improved R knee AROM to >/= 0-130 deg to allow for normal gait and stair mechanics. Baseline: 0-113 with pain at end ROM flexion Goal status: IN PROGRESS  4.  Patient will demonstrate improved B LE strength to >/= 4+/5 for improved stability and ease of mobility. Baseline: Refer to above LE MMT table Goal status: IN PROGRESS  5. Patient will be able to ascend/descend stairs with 1 HR and reciprocal step pattern safely to access home and community.  Baseline:  Goal status: IN PROGRESS  6.  Patient will report >/= 68/80 on LEFS (MCID = 9 pts) to demonstrate improved functional  ability. Baseline: 59 / 80 = 73.8 % Goal status: IN PROGRESS  7.  Patient will be able to perform a functional squat without limitation due to increased R knee pain or LOM. Baseline:  Goal status: IN PROGRESS    PLAN:  PT FREQUENCY: 1-2x/week  PT DURATION: 8 weeks  PLANNED INTERVENTIONS: 97164- PT Re-evaluation, 97110-Therapeutic exercises, 97530- Therapeutic activity, [id number]- Neuromuscular re-education, 97535- Self Care, 09604- Manual therapy, [phone]- Gait training, [phone]- Aquatic Therapy, 97014- Electrical stimulation (unattended), [phone]- Electrical stimulation (manual), 97016- Vasopneumatic device, [id number]- Ultrasound, [id number]- Ionotophoresis 4mg /ml Dexamethasone, Patient/Family education, Balance training, Stair training, Taping, Dry Needling, Joint mobilization, Spinal mobilization, Cryotherapy, and Moist heat - Modalities including ionto, estim and vaso not covered by [employer] FOR NEXT SESSION: Continue taping; L knee ROM/flexibility and L LE strengthening (VMO and proximal emphasis); work on knee stability with a variety of weight shifts (consider resisted walking, clock reach, etc.); glute/hip strengthening   Gurfateh Mcclain April Ma L Lizzete Gough, PT 07/03/2023, 2:47 PM

## 2023-07-06 ENCOUNTER — Encounter: Payer: Self-pay | Admitting: Physical Therapy

## 2023-07-06 ENCOUNTER — Ambulatory Visit: Attending: Family Medicine | Admitting: Physical Therapy

## 2023-07-06 DIAGNOSIS — R2689 Other abnormalities of gait and mobility: Secondary | ICD-10-CM | POA: Insufficient documentation

## 2023-07-06 DIAGNOSIS — R600 Localized edema: Secondary | ICD-10-CM | POA: Insufficient documentation

## 2023-07-06 DIAGNOSIS — M6281 Muscle weakness (generalized): Secondary | ICD-10-CM | POA: Insufficient documentation

## 2023-07-06 DIAGNOSIS — M25561 Pain in right knee: Secondary | ICD-10-CM | POA: Diagnosis not present

## 2023-07-06 DIAGNOSIS — G8929 Other chronic pain: Secondary | ICD-10-CM | POA: Insufficient documentation

## 2023-07-06 DIAGNOSIS — M25661 Stiffness of right knee, not elsewhere classified: Secondary | ICD-10-CM | POA: Insufficient documentation

## 2023-07-06 NOTE — Therapy (Signed)
 OUTPATIENT PHYSICAL THERAPY TREATMENT     Patient Name: Brendan Gregory MRN: [id number] DOB:1980-08-25, 43 y.o., male Today's Date: 07/06/2023   END OF SESSION:  PT End of Session - 07/06/23 1402     Visit Number 8    Date for PT Re-Evaluation 07/25/23    Authorization Type [employer] of [place] as of 06/03/23    PT Start Time 1402    PT Stop Time 1459    PT Time Calculation (min) 57 min    Activity Tolerance Patient tolerated treatment well    Behavior During Therapy WFL for tasks assessed/performed                    Past Medical History:  Diagnosis Date   Agatston coronary artery calcium score less than 100    Ca score 1.  No CAD on coronary CTA   Hypertension    Past Surgical History:  Procedure Laterality Date   MOLE REMOVAL     outpatient procedure   testicular     for undescenced testicle   Patient Active Problem List   Diagnosis Date Noted   Hypokalemia 03/14/2022   Acute cough 03/08/2022   Pansinusitis 03/08/2022   Weakness 03/08/2022   Panic attack 10/25/2021   Generalized anxiety disorder 10/25/2021   Solitary pulmonary nodule 05/04/2021   Agatston coronary artery calcium score less than 100 01/25/2021   Morbid obesity (HCC) 11/24/2020   Obstructive sleep apnea on CPAP 05/16/2019   Essential hypertension 05/16/2019    PCP: Sharlene Dory, DO   REFERRING PROVIDER: Sharlene Dory, DO  REFERRING DIAG: [phone] (ICD-10-CM) - Chronic pain of right knee   THERAPY DIAG:  Chronic pain of right knee  Stiffness of right knee, not elsewhere classified  Other abnormalities of gait and mobility  Muscle weakness (generalized)  Localized edema  RATIONALE FOR EVALUATION AND TREATMENT: Rehabilitation  ONSET DATE: October 2024  NEXT MD VISIT:  07/19/23   SUBJECTIVE:                                                                                                                                                                                                          SUBJECTIVE STATEMENT: Pt feels like his knee is improving with better tolerance for stair negotiation.  He notes that the kinesiotaping seemed to help and would like to reapply the tape today.  PAIN: Are you having pain? Yes: NPRS scale: 1.5/10  Pain location: R medial anterior knee/peripatellar Pain description: stiff, locking up  Aggravating factors: toddler running into knee, sitting  in figure-4, walking, squatting, climbing stairs, kneeling/crawling with toddler Relieving factors: long leg distraction, "cracking" or "popping" knee, sometimes elliptical   PERTINENT HISTORY:  HTN, GAD, morbid obesity, OSA  PRECAUTIONS: None  RED FLAGS: None  WEIGHT BEARING RESTRICTIONS: No  FALLS:  Has patient fallen in last 6 months? 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 Patellar tendon normal-appearing Lateral joint line normal-appearing Medial joint line normal-appearing Anterior lateral knee no clear abnormality visible.  However this area is tender to palpation with ultrasound probe. Common fibular nerve normal-appearing nontender to ultrasound probe. IMPRESSION: Largely normal-appearing knee MSK ultrasound.  09/02/21 - DG L  knee: FINDINGS: No evidence of fracture, dislocation, or joint effusion. No evidence of arthropathy or other focal bone abnormality. Soft tissues are unremarkable. IMPRESSION: Negative.  PATIENT SURVEYS:  LEFS 59 / 80 = 73.8 %  COGNITION: Overall cognitive status: Within functional limits for tasks assessed    SENSATION: WFL  EDEMA:  Pt denies, but R knee appears larger than L today.  POSTURE:  Slight genu varus with recurvatum on R>L  PALPATION: TTP over medial plica and inferior medial patella. Decreased/delayed VMO contraction on L quad set.  MUSCLE LENGTH: Hamstrings: mild light L>R ITB: mod tight B Piriformis: WFL Hip flexors: WFL Quads: mild tight  Heelcord: NT  LOWER EXTREMITY ROM:  Active ROM Right eval Left eval R 05/30/23  Knee flexion 113 p! 134 122 p!  Knee extension 0 0 0  (Blank rows = not tested)  LOWER EXTREMITY MMT:  MMT Right eval Left eval R 05/30/23 L 05/30/23  Hip flexion 4- p! 5 4 p! 5  Hip extension 4+ 4+ 4+ 4+  Hip abduction 4 4+ 4 4+  Hip adduction 4+ 4+ 4 4+  Hip internal rotation 4+ 5 4+ p! 5  Hip external rotation 4- p! 4+ 4- p! 4+  Knee flexion 4+ 5 4+ p! 5  Knee extension 4 p! 5 4+ 5  Ankle dorsiflexion 5 5 5 5   Ankle plantarflexion      Ankle inversion      Ankle eversion       (Blank rows = not tested)   TODAY'S TREATMENT:   07/06/23 THERAPEUTIC EXERCISE: To improve strength and endurance.  Demonstration, verbal and tactile cues throughout for technique.  TM - 2.5 mph x 6 min at 2.5 incline/elevation  MANUAL THERAPY: To promote increased ROM, reduced pain, and improved patellar alignment utilizing kinesiotaping.  Ktape to R knee - chondromalacia patella pattern with slight medial patellar glide on perpendicular strip => patient instructed in self-application  NEUROMUSCULAR RE-EDUCATION: To improve balance, coordination, kinesthesia, posture, proprioception, and reduce fall risk.  SLS + opp LE GTB 4-way SLR x 10 bil, 2 pole A  for balance Glute max iso into step 2  x 10  MODALITIES: Game Ready vasopneumatic compression post session to R knee x 10 min, high compression, 34 to reduce post-exercise pain and swelling/edema   07/03/23 THERAPEUTIC EXERCISE NuStep - L6 x 4 min - limited due to pain Prone quad stretch 2x 30" Prone quad set 2x10x3"  NEUROMUSCULAR RE-ED SLR in slight ER 2x10 Bridge + ball squeeze 2x10, knees slightly extended x10 Prone hip ext + knee flexion 2x10 Prone hip IR 2x10 SL heel raise 2x10 Glute max iso into step 2x10 Glute med iso into wall 2x10  MODALITIES: Game Ready vasopneumatic compression post session to R knee x 10 min, high compression, 34 to reduce post-exercise pain and swelling/edema   06/29/23 THERAPEUTIC EXERCISE: To improve strength and endurance.  Demonstration, verbal and tactile cues throughout for technique.  NuStep - L5 x 6 min  MANUAL THERAPY: To promote increased ROM, reduced pain, and improved patellar alignment  utilizing kinesiotaping.  Ktape to R knee - chondromalacia patella pattern with slight medial patellar glide on perpendicular strip  NEUROMUSCULAR RE-EDUCATION: To improve balance, coordination, kinesthesia, posture, and proprioception. Mini-wall squat + hip ADD ball squeeze 2 x 10 R 6"  lateral eccentric lowering with light heel touch to floor 2 x 10 - 1-2 pole assist for balance  R 4" fwd eccentric lowering x 5 - limited by pain R SLS + 5-way clock to colored dots x 6 cycles Fitter (1 black/1 blue) hip abduction x 20, 2 pole A for balance initially weaning to no support Fitter (1 black/1 blue) hip extension (rear foot on near edge) x 10, 2 pole A for balance - cues to prevent fwd knee from passing in front of toes TRX squat 2 x 10  MODALITIES: Game Ready vasopneumatic compression post session to R knee x 10 min, high compression, 34 to reduce post-exercise pain and swelling/edema   PATIENT EDUCATION:  Education details: HEP progression - SLS  + GTB 4-way hip SLR and continue with current HEP  Person educated: Patient Education method: Explanation, Demonstration, Verbal cues, and MedBridgeGO app updated  Education comprehension: verbalized understanding, returned demonstration, verbal cues required, and needs further education  HOME EXERCISE PROGRAM: *Pt provided MedBridgeGO app access.  Access Code: [facility] URL: [email or web address] Date: 07/06/2023 Prepared by: Glenetta Hew  Exercises - Supine Hamstring Stretch with Strap  - 2 x daily - 7 x weekly - 2 sets - 2 reps - 30 sec hold - Supine Iliotibial Band Stretch with Strap  - 2 x daily - 7 x weekly - 3 reps - 30 sec hold - Active Straight Leg Raise with Quad Set  - 1 x daily - 7 x weekly - 2 sets - 10 reps - Prone Quadriceps Stretch with Strap  - 1 x daily - 7 x weekly - 2 sets - 30 sec hold - Clamshell with Resistance  - 1 x daily - 7 x weekly - 2 sets - 10 reps - Standing Terminal Knee Extension with Resistance  - 1 x daily - 7 x weekly - 2 sets - 10 reps - 5 sec hold - Band Walks  - 1 x daily - 7 x weekly - 2 sets - 10 reps - Wall Squat Hold with Ball  - 1 x daily - 7 x weekly - 2 sets - 10 reps - Isometric Gluteus Medius at Wall  - 1 x daily - 7 x weekly - 2 sets - 10 reps - 3 sec hold - Step Up  - 1 x daily -  7 x weekly - 2 sets - 10 reps - Standing Hip Flexion with Anchored Resistance and Chair Support  - 1 x daily - 3 x weekly - 2 sets - 10 reps - 3 sec hold - Standing Hip Adduction with Resistance  - 1 x daily - 3 x weekly - 2 sets - 10 reps - 3 sec hold - Standing Hip Extension with Anchored Resistance  - 1 x daily - 3 x weekly - 2 sets - 10 reps - 3 sec hold - Standing Hip Abduction with Anchored Resistance  - 1 x daily - 3 x weekly - 2 sets - 10 reps - 3 sec hold   ASSESSMENT:  CLINICAL IMPRESSION: Park "Josh" reports benefit from taping and requested reapplication today - tape applied providing education to patient for self application PRN at  home.  Pain better today, with patient now reporting 20% improvement in pain since eval.  Patient able to progress standing exercises with combined hip stability and SLS focusing on avoiding locking knees in genu recurvatum to work on increasing knee stability in standing with variable weight shifts while increasing coordinated glute and quad activation.  Exercises well tolerated with instructions added to MedBridgeGO app at patient request.  Sharia Reeve will benefit from continued skilled PT to address ongoing impairment and deficits to improve mobility and activity tolerance with decreased pain interference.   OBJECTIVE IMPAIRMENTS: Abnormal gait, decreased activity tolerance, decreased mobility, difficulty walking, decreased ROM, decreased strength, hypomobility, increased edema, increased fascial restrictions, impaired perceived functional ability, increased muscle spasms, impaired flexibility, improper body mechanics, postural dysfunction, and pain.   ACTIVITY LIMITATIONS: bending, squatting, stairs, transfers, locomotion level, and caring for others  PARTICIPATION LIMITATIONS: shopping, community activity, and yard work  PERSONAL FACTORS: Fitness, Past/current experiences, Profession, Time since onset of injury/illness/exacerbation, and 3+ comorbidities: HTN, GAD, morbid obesity, OSA  are also affecting patient's functional outcome.   REHAB POTENTIAL: Good  CLINICAL DECISION MAKING: Stable/uncomplicated  EVALUATION COMPLEXITY: Low   GOALS: Goals reviewed with patient? Yes  SHORT TERM GOALS: Target date: 05/15/2023, extended to 06/27/2023   Patient will be independent with initial HEP. Baseline:  Goal status: MET - 06/29/23   2.  Patient will report at least 25% improvement in R knee pain to improve QOL. Baseline: 2/10 currently, 3-4/10 day to day, up to 7-8/10 Goal status: IN PROGRESS - 07/06/23 - pt reporting 20% improvement overall  3.  Patient will demonstrate improved R knee AROM to >/=  0-120 deg to allow for normal gait and stair mechanics. Baseline: 0-113 with pain at end ROM flexion Goal status: MET - 05/30/23 - R knee 0-122   LONG TERM GOALS: Target date: 06/05/2023, extended to 07/25/2023  Patient will be independent with advanced/ongoing HEP to improve outcomes and carryover.  Baseline:  Goal status: IN PROGRESS   2.  Patient will report at least 50-75% improvement in R knee pain to improve QOL. Baseline: 2/10 currently, 3-4/10 day to day, up to 7-8/10 Goal status: IN PROGRESS  3.  Patient will demonstrate improved R knee AROM to >/= 0-130 deg to allow for normal gait and stair mechanics. Baseline: 0-113 with pain at end ROM flexion Goal status: IN PROGRESS  4.  Patient will demonstrate improved B LE strength to >/= 4+/5 for improved stability and ease of mobility. Baseline: Refer to above LE MMT table Goal status: IN PROGRESS  5. Patient will be able to ascend/descend stairs with 1 HR and reciprocal step pattern safely to access  home and community.  Baseline:  Goal status: IN PROGRESS  6.  Patient will report >/= 68/80 on LEFS (MCID = 9 pts) to demonstrate improved functional ability. Baseline: 59 / 80 = 73.8 % Goal status: IN PROGRESS  7.  Patient will be able to perform a functional squat without limitation due to increased R knee pain or LOM. Baseline:  Goal status: IN PROGRESS    PLAN:  PT FREQUENCY: 1-2x/week  PT DURATION: 8 weeks  PLANNED INTERVENTIONS: 97164- PT Re-evaluation, 97110-Therapeutic exercises, 97530- Therapeutic activity, [id number]- Neuromuscular re-education, 97535- Self Care, 16109- Manual therapy, [phone]- Gait training, [phone]- Aquatic Therapy, 97014- Electrical stimulation (unattended), [phone]- Electrical stimulation (manual), 97016- Vasopneumatic device, [id number]- Ultrasound, 29562- Ionotophoresis 4mg /ml Dexamethasone, Patient/Family education, Balance training, Stair training, Taping, Dry Needling, Joint mobilization, Spinal  mobilization, Cryotherapy, and Moist heat - Modalities including ionto, estim and vaso not covered by [employer] FOR NEXT SESSION: Continue taping; L knee ROM/flexibility and L LE strengthening (VMO and proximal emphasis); work on knee stability with a variety of weight shifts (consider resisted walking, clock reach, etc.); glute/hip strengthening   Marry Guan, PT 07/06/2023, 6:19 PM

## 2023-07-07 ENCOUNTER — Encounter: Payer: Self-pay | Admitting: Family Medicine

## 2023-07-07 ENCOUNTER — Other Ambulatory Visit: Payer: Self-pay | Admitting: Family Medicine

## 2023-07-07 DIAGNOSIS — G8929 Other chronic pain: Secondary | ICD-10-CM

## 2023-07-10 ENCOUNTER — Ambulatory Visit

## 2023-07-10 DIAGNOSIS — G8929 Other chronic pain: Secondary | ICD-10-CM

## 2023-07-10 DIAGNOSIS — M25661 Stiffness of right knee, not elsewhere classified: Secondary | ICD-10-CM | POA: Diagnosis not present

## 2023-07-10 DIAGNOSIS — R2689 Other abnormalities of gait and mobility: Secondary | ICD-10-CM

## 2023-07-10 DIAGNOSIS — M6281 Muscle weakness (generalized): Secondary | ICD-10-CM | POA: Diagnosis not present

## 2023-07-10 DIAGNOSIS — R600 Localized edema: Secondary | ICD-10-CM

## 2023-07-10 DIAGNOSIS — M25561 Pain in right knee: Secondary | ICD-10-CM | POA: Diagnosis not present

## 2023-07-10 NOTE — Therapy (Addendum)
 OUTPATIENT PHYSICAL THERAPY TREATMENT / DISCHARGE SUMMARY    Patient Name: Brendan Gregory MRN: [id number] DOB:08-11-1980, 43 y.o., male Today's Date: 07/10/2023   END OF SESSION:  PT End of Session - 07/10/23 0956     Visit Number 9    Date for PT Re-Evaluation 07/25/23    Authorization Type [employer] [place]  as of 06/03/23    PT Start Time 0932    PT Stop Time 1028    PT Time Calculation (min) 56 min    Activity Tolerance Patient tolerated treatment well    Behavior During Therapy WFL for tasks assessed/performed                     Past Medical History:  Diagnosis Date   Agatston coronary artery calcium  score less than 100    Ca score 1.  No CAD on coronary CTA   Hypertension    Past Surgical History:  Procedure Laterality Date   MOLE REMOVAL     outpatient procedure   testicular     for undescenced testicle   Patient Active Problem List   Diagnosis Date Noted   Hypokalemia 03/14/2022   Acute cough 03/08/2022   Pansinusitis 03/08/2022   Weakness 03/08/2022   Panic attack 10/25/2021   Generalized anxiety disorder 10/25/2021   Solitary pulmonary nodule 05/04/2021   Agatston coronary artery calcium  score less than 100 01/25/2021   Morbid obesity (HCC) 11/24/2020   Obstructive sleep apnea on CPAP 05/16/2019   Essential hypertension 05/16/2019    PCP: Frann Mabel Mt, DO   REFERRING PROVIDER: Frann Mabel Mt, DO  REFERRING DIAG: [phone] (ICD-10-CM) - Chronic pain of right knee   THERAPY DIAG:  Chronic pain of right knee  Stiffness of right knee, not elsewhere classified  Other abnormalities of gait and mobility  Muscle weakness (generalized)  Localized edema  RATIONALE FOR EVALUATION AND TREATMENT: Rehabilitation  ONSET DATE: October 2024  NEXT MD VISIT:  07/19/23   SUBJECTIVE:                                                                                                                                                                                                          SUBJECTIVE STATEMENT: Pt reports he feels pretty good  PAIN: Are you having pain? Yes: NPRS scale: 1/10  Pain location: R medial anterior knee/peripatellar Pain description: stiff, locking up  Aggravating factors: toddler running into knee, sitting in figure-4, walking, squatting, climbing stairs, kneeling/crawling with toddler Relieving factors: long leg distraction, cracking or popping knee, sometimes elliptical  PERTINENT HISTORY:  HTN, GAD, morbid obesity, OSA  PRECAUTIONS: None  RED FLAGS: None  WEIGHT BEARING RESTRICTIONS: No  FALLS:  Has patient fallen in last 6 months? 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   OBJECTIVE: (objective measures completed at initial evaluation unless otherwise dated)  DIAGNOSTIC FINDINGS:  06/29/23 - DG R knee - results pending  MRI ordered for R knee but not yet scheduled  No recent imaging for R knee as of eval  09/07/21 - Diagnostic Limited MSK Ultrasound of: Left knee Quad tendon intact normal. Patellar tendon normal-appearing Lateral joint line normal-appearing Medial joint line normal-appearing Anterior lateral knee no clear abnormality visible.  However this area is tender to palpation with ultrasound probe. Common fibular nerve normal-appearing nontender to ultrasound probe. IMPRESSION: Largely normal-appearing knee MSK ultrasound.  09/02/21 - DG L knee: FINDINGS: No evidence of fracture, dislocation, or joint effusion. No evidence of arthropathy or other focal  bone abnormality. Soft tissues are unremarkable. IMPRESSION: Negative.  PATIENT SURVEYS:  LEFS 59 / 80 = 73.8 %  COGNITION: Overall cognitive status: Within functional limits for tasks assessed    SENSATION: WFL  EDEMA:  Pt denies, but R knee appears larger than L today.  POSTURE:  Slight genu varus with recurvatum on R>L  PALPATION: TTP over medial plica and inferior medial patella. Decreased/delayed VMO contraction on L quad set.  MUSCLE LENGTH: Hamstrings: mild light L>R ITB: mod tight B Piriformis: WFL Hip flexors: WFL Quads: mild tight  Heelcord: NT  LOWER EXTREMITY ROM:  Active ROM Right eval Left eval R 05/30/23  Knee flexion 113 p! 134 122 p!  Knee extension 0 0 0  (Blank rows = not tested)  LOWER EXTREMITY MMT:  MMT Right eval Left eval R 05/30/23 L 05/30/23  Hip flexion 4- p! 5 4 p! 5  Hip extension 4+ 4+ 4+ 4+  Hip abduction 4 4+ 4 4+  Hip adduction 4+ 4+ 4 4+  Hip internal rotation 4+ 5 4+ p! 5  Hip external rotation 4- p! 4+ 4- p! 4+  Knee flexion 4+ 5 4+ p! 5  Knee extension 4 p! 5 4+ 5  Ankle dorsiflexion 5 5 5 5   Ankle plantarflexion      Ankle inversion      Ankle eversion       (Blank rows = not tested)   TODAY'S TREATMENT:  07/10/23 THERAPEUTIC EXERCISE: To improve strength and endurance.  Demonstration, verbal and tactile cues throughout for technique.  TM - 2.5 mph x 6 min at 2.5 incline/elevation Supine SLR x 6 2lb RLE- irritation to knee Supine heel slides 2x10 AAROM with peanut ball   NEUROMUSCULAR RE-EDUCATION: To improve balance, coordination, kinesthesia, posture, proprioception, and reduce fall risk.  SLS + opp LE GTB 4-way SLR 2 x 10 bil, 2 pole A for balance MODALITIES: Game Ready vasopneumatic compression post session to R knee x 10 min, high compression, 34 to reduce post-exercise pain and swelling/edema 07/06/23 THERAPEUTIC EXERCISE: To improve strength and endurance.  Demonstration, verbal and tactile cues throughout for  technique.  TM - 2.5 mph x 6 min  at 2.5 incline/elevation  MANUAL THERAPY: To promote increased ROM, reduced pain, and improved patellar alignment utilizing kinesiotaping.  Ktape to R knee - chondromalacia patella pattern with slight medial patellar glide on perpendicular strip => patient instructed in self-application  NEUROMUSCULAR RE-EDUCATION: To improve balance, coordination, kinesthesia, posture, proprioception, and reduce fall risk.  SLS + opp LE GTB 4-way SLR x 10 bil, 2 pole A for balance Glute max iso into step 2 x 10  MODALITIES: Game Ready vasopneumatic compression post session to R knee x 10 min, high compression, 34 to reduce post-exercise pain and swelling/edema   07/03/23 THERAPEUTIC EXERCISE NuStep - L6 x 4 min - limited due to pain Prone quad stretch 2x 30 Prone quad set 2x10x3  NEUROMUSCULAR RE-ED SLR in slight ER 2x10 Bridge + ball squeeze 2x10, knees slightly extended x10 Prone hip ext + knee flexion 2x10 Prone hip IR 2x10 SL heel raise 2x10 Glute max iso into step 2x10 Glute med iso into wall 2x10  MODALITIES: Game Ready vasopneumatic compression post session to R knee x 10 min, high compression, 34 to reduce post-exercise pain and swelling/edema   06/29/23 THERAPEUTIC EXERCISE: To improve strength and endurance.  Demonstration, verbal and tactile cues throughout for technique.  NuStep - L5 x 6 min  MANUAL THERAPY: To promote increased ROM, reduced pain, and improved patellar alignment utilizing kinesiotaping.  Ktape to R knee - chondromalacia patella pattern with slight medial patellar glide on perpendicular strip  NEUROMUSCULAR RE-EDUCATION: To improve balance, coordination, kinesthesia, posture, and proprioception. Mini-wall squat + hip ADD ball squeeze 2 x 10 R 6  lateral eccentric lowering with light heel touch to floor 2 x 10 - 1-2 pole assist for balance  R 4 fwd eccentric lowering x 5 - limited by pain R SLS + 5-way clock to colored dots x  6 cycles Fitter (1 black/1 blue) hip abduction x 20, 2 pole A for balance initially weaning to no support Fitter (1 black/1 blue) hip extension (rear foot on near edge) x 10, 2 pole A for balance - cues to prevent fwd knee from passing in front of toes TRX squat 2 x 10  MODALITIES: Game Ready vasopneumatic compression post session to R knee x 10 min, high compression, 34 to reduce post-exercise pain and swelling/edema   PATIENT EDUCATION:  Education details: HEP progression - SLS + GTB 4-way hip SLR and continue with current HEP  Person educated: Patient Education method: Explanation, Demonstration, Verbal cues, and MedBridgeGO app updated  Education comprehension: verbalized understanding, returned demonstration, verbal cues required, and needs further education  HOME EXERCISE PROGRAM: *Pt provided MedBridgeGO app access.  Access Code: [facility] URL: [email or web address] Date: 07/06/2023 Prepared by: Elijah Hidden  Exercises - Supine Hamstring Stretch with Strap  - 2 x daily - 7 x weekly - 2 sets - 2 reps - 30 sec hold - Supine Iliotibial Band Stretch with Strap  - 2 x daily - 7 x weekly - 3 reps - 30 sec hold - Active Straight Leg Raise with Quad Set  - 1 x daily - 7 x weekly - 2 sets - 10 reps - Prone Quadriceps Stretch with Strap  - 1 x daily - 7 x weekly - 2 sets - 30 sec hold - Clamshell with Resistance  - 1 x daily - 7 x weekly - 2 sets - 10 reps - Standing Terminal Knee Extension with Resistance  - 1 x daily - 7 x weekly - 2 sets - 10 reps -  5 sec hold - Band Walks  - 1 x daily - 7 x weekly - 2 sets - 10 reps - Wall Squat Hold with Ball  - 1 x daily - 7 x weekly - 2 sets - 10 reps - Isometric Gluteus Medius at Wall  - 1 x daily - 7 x weekly - 2 sets - 10 reps - 3 sec hold - Step Up  - 1 x daily - 7 x weekly - 2 sets - 10 reps - Standing Hip Flexion with Anchored Resistance and Chair Support  - 1 x daily - 3 x weekly - 2 sets - 10 reps - 3 sec hold - Standing  Hip Adduction with Resistance  - 1 x daily - 3 x weekly - 2 sets - 10 reps - 3 sec hold - Standing Hip Extension with Anchored Resistance  - 1 x daily - 3 x weekly - 2 sets - 10 reps - 3 sec hold - Standing Hip Abduction with Anchored Resistance  - 1 x daily - 3 x weekly - 2 sets - 10 reps - 3 sec hold   ASSESSMENT:  CLINICAL IMPRESSION: Pt was flared up easily today, his R knee started feeling irritated with standing 4 way hip extension, then with SLR he noted more of this feeling. The heel slides reduced this feeling but to avoid further irritation  we ended session early and did GR device to address the pain. [name] will benefit from continued skilled PT to address ongoing impairment and deficits to improve mobility and activity tolerance with decreased pain interference.   OBJECTIVE IMPAIRMENTS: Abnormal gait, decreased activity tolerance, decreased mobility, difficulty walking, decreased ROM, decreased strength, hypomobility, increased edema, increased fascial restrictions, impaired perceived functional ability, increased muscle spasms, impaired flexibility, improper body mechanics, postural dysfunction, and pain.   ACTIVITY LIMITATIONS: bending, squatting, stairs, transfers, locomotion level, and caring for others  PARTICIPATION LIMITATIONS: shopping, community activity, and yard work  PERSONAL FACTORS: Fitness, Past/current experiences, Profession, Time since onset of injury/illness/exacerbation, and 3+ comorbidities: HTN, GAD, morbid obesity, OSA are also affecting patient's functional outcome.   REHAB POTENTIAL: Good  CLINICAL DECISION MAKING: Stable/uncomplicated  EVALUATION COMPLEXITY: Low   GOALS: Goals reviewed with patient? Yes  SHORT TERM GOALS: Target date: 05/15/2023, extended to 06/27/2023   Patient will be independent with initial HEP. Baseline:  Goal status: MET - 06/29/23   2.  Patient will report at least 25% improvement in R knee pain to improve QOL. Baseline:  2/10 currently, 3-4/10 day to day, up to 7-8/10 Goal status: IN PROGRESS - 07/06/23 - pt reporting 20% improvement overall  3.  Patient will demonstrate improved R knee AROM to >/= 0-120 deg to allow for normal gait and stair mechanics. Baseline: 0-113 with pain at end ROM flexion Goal status: MET - 05/30/23 - R knee 0-122   LONG TERM GOALS: Target date: 06/05/2023, extended to 07/25/2023  Patient will be independent with advanced/ongoing HEP to improve outcomes and carryover.  Baseline:  Goal status: IN PROGRESS   2.  Patient will report at least 50-75% improvement in R knee pain to improve QOL. Baseline: 2/10 currently, 3-4/10 day to day, up to 7-8/10 Goal status: IN PROGRESS  3.  Patient will demonstrate improved R knee AROM to >/= 0-130 deg to allow for normal gait and stair mechanics. Baseline: 0-113 with pain at end ROM flexion Goal status: IN PROGRESS  4.  Patient will demonstrate improved B LE strength to >/= 4+/5 for improved  stability and ease of mobility. Baseline: Refer to above LE MMT table Goal status: IN PROGRESS  5. Patient will be able to ascend/descend stairs with 1 HR and reciprocal step pattern safely to access home and community.  Baseline:  Goal status: IN PROGRESS  6.  Patient will report >/= 68/80 on LEFS (MCID = 9 pts) to demonstrate improved functional ability. Baseline: 59 / 80 = 73.8 % Goal status: IN PROGRESS  7.  Patient will be able to perform a functional squat without limitation due to increased R knee pain or LOM. Baseline:  Goal status: IN PROGRESS    PLAN:  PT FREQUENCY: 1-2x/week  PT DURATION: 8 weeks  PLANNED INTERVENTIONS: 97164- PT Re-evaluation, 97110-Therapeutic exercises, 97530- Therapeutic activity, [id number]- Neuromuscular re-education, 97535- Self Care, [id number]- Manual therapy, [phone]- Gait training, [phone]- Aquatic Therapy, 97014- Electrical stimulation (unattended), [phone]- Electrical stimulation (manual), 97016- Vasopneumatic device,  [id number]- Ultrasound, [id number]- Ionotophoresis 4mg /ml Dexamethasone, Patient/Family education, Balance training, Stair training, Taping, Dry Needling, Joint mobilization, Spinal mobilization, Cryotherapy, and Moist heat - Modalities including ionto, estim and vaso not covered by [employer] FOR NEXT SESSION: Continue taping; L knee ROM/flexibility and L LE strengthening (VMO and proximal emphasis); work on knee stability with a variety of weight shifts (consider resisted walking, clock reach, etc.); glute/hip strengthening   Edilson Vital L Kimberle Stanfill, PTA 07/10/2023, 10:10 AM    PHYSICAL THERAPY DISCHARGE SUMMARY  Visits from Start of Care: 9  Current functional level related to goals / functional outcomes: Refer to above clinical impression and goal assessment for status as of last visit on 07/10/2023. Patient was unable to return to PT prior to the end of his POC due to traveling for work, therefore will proceed with discharge from PT for this episode.     Remaining deficits: As above. Unable to formally assess status at discharge due to failure to return to PT.    Education / Equipment: HEP    Patient agrees to discharge. Patient goals were partially met. Patient is being discharged due to not returning since the last visit.  Elijah EMERSON Hidden, PT 10/27/2023, 12:48 PM  [facility] [address]  [address] [place], [place], [address] Phone: [phone]   Fax:  [phone]

## 2023-07-18 ENCOUNTER — Encounter: Payer: Self-pay | Admitting: Physical Therapy

## 2023-07-18 ENCOUNTER — Ambulatory Visit

## 2023-07-18 DIAGNOSIS — M25461 Effusion, right knee: Secondary | ICD-10-CM | POA: Diagnosis not present

## 2023-07-18 DIAGNOSIS — G8929 Other chronic pain: Secondary | ICD-10-CM

## 2023-07-18 DIAGNOSIS — M25561 Pain in right knee: Secondary | ICD-10-CM

## 2023-07-19 ENCOUNTER — Encounter: Payer: Self-pay | Admitting: Family Medicine

## 2023-07-19 ENCOUNTER — Ambulatory Visit: Admitting: Family Medicine

## 2023-07-19 VITALS — BP 142/82 | HR 86 | Temp 98.0°F | Resp 16 | Ht 71.0 in | Wt 284.2 lb

## 2023-07-19 DIAGNOSIS — I10 Essential (primary) hypertension: Secondary | ICD-10-CM | POA: Diagnosis not present

## 2023-07-19 MED ORDER — LOSARTAN POTASSIUM 100 MG PO TABS: 100.0000 mg | ORAL_TABLET | Freq: Every day | ORAL | 2 refills | Status: DC

## 2023-07-19 NOTE — Progress Notes (Signed)
 Chief Complaint  Patient presents with   Follow-up    Follow up    Subjective Brendan Gregory is a 43 y.o. male who presents for hypertension follow up. He does monitor home blood pressures. Blood pressures ranging from 130-160's/80-90's on average. He is compliant with medications- Micardis 80 mg/d. Patient has these side effects of medication: headaches He is sometimes adhering to a healthy diet overall. Current exercise: elliptical  No CP or SOB.    Past Medical History:  Diagnosis Date   Agatston coronary artery calcium score less than 100    Ca score 1.  No CAD on coronary CTA   Hypertension     Exam BP (!) 142/82 (BP Location: Left Arm, Cuff Size: Large)   Pulse 86   Temp 98 F (36.7 C) (Oral)   Resp 16   Ht 5\' 11"  (1.803 m)   Wt 284 lb 3.2 oz (128.9 kg)   SpO2 96%   BMI 39.64 kg/m  General:  well developed, well nourished, in no apparent distress Heart: RRR, no bruits, no LE edema Lungs: clear to auscultation, no accessory muscle use Psych: well oriented with normal range of affect and appropriate judgment/insight  Essential hypertension - Plan: Basic metabolic panel with GFR  Chronic, not controlled.  Adverse effect of chronic medication.  Stop Micardis, restart losartan 100 mg daily.  Monitor blood pressure at home.  Counseled on diet and exercise.  Send readings in 3 weeks.  Will consider carvedilol 12.5 mg twice daily if still not controlled.  He has failed amlodipine, hydrochlorothiazide/chlorthalidone, valsartan, olmesartan and telmisartan. F/u in 6 weeks. The patient voiced understanding and agreement to the plan.  Shellie Dials Newell, DO 07/19/23  1:42 PM

## 2023-07-19 NOTE — Patient Instructions (Signed)
 Give us  2-3 business days to get the results of your labs back.   Keep the diet clean and stay active.  Send me a message in 3 weeks with your blood pressure readings.   Let us  know if you need anything.

## 2023-07-20 ENCOUNTER — Encounter: Payer: Self-pay | Admitting: Family Medicine

## 2023-07-20 LAB — BASIC METABOLIC PANEL WITH GFR
BUN: 13 mg/dL (ref 6–23)
CO2: 30 meq/L (ref 19–32)
Calcium: 9.4 mg/dL (ref 8.4–10.5)
Chloride: 99 meq/L (ref 96–112)
Creatinine, Ser: 1.03 mg/dL (ref 0.40–1.50)
GFR: 90.25 mL/min (ref >60.00)
Glucose, Bld: 91 mg/dL (ref 70–99)
Potassium: 4.6 meq/L (ref 3.5–5.1)
Sodium: 139 meq/L (ref 135–145)

## 2023-07-25 ENCOUNTER — Ambulatory Visit: Admitting: Physical Therapy

## 2023-07-29 ENCOUNTER — Telehealth: Admitting: Family Medicine

## 2023-07-29 DIAGNOSIS — J069 Acute upper respiratory infection, unspecified: Secondary | ICD-10-CM

## 2023-07-29 NOTE — Progress Notes (Signed)
 Virtual Visit Consent   Mylz Stivers, you are scheduled for a virtual visit with a [facility] provider today. Just as with appointments in the office, your consent must be obtained to participate. Your consent will be active for this visit and any virtual visit you may have with one of our providers in the next 365 days. If you have a MyChart account, a copy of this consent can be sent to you electronically.  As this is a virtual visit, video technology does not allow for your provider to perform a traditional examination. This may limit your provider's ability to fully assess your condition. If your provider identifies any concerns that need to be evaluated in person or the need to arrange testing (such as labs, EKG, etc.), we will make arrangements to do so. Although advances in technology are sophisticated, we cannot ensure that it will always work on either your end or our end. If the connection with a video visit is poor, the visit may have to be switched to a telephone visit. With either a video or telephone visit, we are not always able to ensure that we have a secure connection.  By engaging in this virtual visit, you consent to the provision of healthcare and authorize for your insurance to be billed (if applicable) for the services provided during this visit. Depending on your insurance coverage, you may receive a charge related to this service.  I need to obtain your verbal consent now. Are you willing to proceed with your visit today? Brendan Gregory has provided verbal consent on 07/29/2023 for a virtual visit (video or telephone). Louvenia Roys, [place]  Date: 07/29/2023 11:20 AM   Virtual Visit via Video Note   I, Louvenia Roys, connected with  Brendan Gregory  ([id number], 1981/01/23) on 07/29/23 at 11:15 AM EDT by a video-enabled telemedicine application and verified that I am speaking with the correct person using two identifiers.  Location: Patient: Virtual Visit Location Patient:  Home Provider: Virtual Visit Location Provider: Home Office   I discussed the limitations of evaluation and management by telemedicine and the availability of in person appointments. The patient expressed understanding and agreed to proceed.    History of Present Illness: Brendan Gregory is a 43 y.o. who identifies as a male who was assigned male at birth, and is being seen today for c/o having an upper respiratory infection. Pt c/o cough and congestion. Pt states coughing up white phlegm. Pt states his daughter and wife are both currently sick with similar symptoms. Pt states he is currently in [place]  for a work trip. Pt states taking Nyquil for symptoms. Pt does not report headache, fever or chills.   HPI: HPI  Problems:  Patient Active Problem List   Diagnosis Date Noted   Hypokalemia 03/14/2022   Acute cough 03/08/2022   Pansinusitis 03/08/2022   Weakness 03/08/2022   Panic attack 10/25/2021   Generalized anxiety disorder 10/25/2021   Solitary pulmonary nodule 05/04/2021   Agatston coronary artery calcium  score less than 100 01/25/2021   Morbid obesity (HCC) 11/24/2020   Obstructive sleep apnea on CPAP 05/16/2019   Essential hypertension 05/16/2019    Allergies:  Allergies  Allergen Reactions   Amlodipine  Nausea Only    HA and nausea   Medications:  Current Outpatient Medications:    atorvastatin  (LIPITOR) 10 MG tablet, TAKE 1 TABLET BY MOUTH EVERY DAY, Disp: 90 tablet, Rfl: 3   Garlic (GARLIQUE) 400 MG TBEC, Take by mouth., Disp: , Rfl:  losartan  (COZAAR ) 100 MG tablet, Take 1 tablet (100 mg total) by mouth daily., Disp: 30 tablet, Rfl: 2   Omega-3 Fatty Acids (FISH OIL) 1000 MG CAPS, Take by mouth daily., Disp: , Rfl:    OVER THE COUNTER MEDICATION, Take 1 capsule by mouth daily at 6 (six) AM. Aged beet root, Disp: , Rfl:   Observations/Objective: Patient is well-developed, well-nourished in no acute distress.  Resting comfortably at home.  Head is normocephalic,  atraumatic.  No labored breathing.  Speech is clear and coherent with logical content.  Patient is alert and oriented at baseline.    Assessment and Plan: 1. Upper respiratory tract infection, unspecified type (Primary)  -Advised Pt to obtain over the counter Flonase , Coricidin or regular Mucinex to help with symptoms -Advised Pt since he is in [place]  and I am in [place] I cannot prescribe medications  -Advised Pt to purchase Flu and COVID test over the counter and if worsening symptoms to visit local urgent care in [place]  -Differential diagnosis includes Covid, Flu or bronchitis   Follow Up Instructions: I discussed the assessment and treatment plan with the patient. The patient was provided an opportunity to ask questions and all were answered. The patient agreed with the plan and demonstrated an understanding of the instructions.  A copy of instructions were sent to the patient via MyChart unless otherwise noted below.     The patient was advised to call back or seek an in-person evaluation if the symptoms worsen or if the condition fails to improve as anticipated.    Louvenia Roys, PA-C

## 2023-07-29 NOTE — Patient Instructions (Signed)
 Brendan Gregory, thank you for joining Louvenia Roys, PA-C for today's virtual visit.  While this provider is not your primary care provider (PCP), if your PCP is located in our provider database this encounter information will be shared with them immediately following your visit.   A [facility] MyChart account gives you access to today's visit and all your visits, tests, and labs performed at [facility] " click here if you don't have a [facility] MyChart account or go to [email or web address]  Consent: (Patient) Brendan Gregory provided verbal consent for this virtual visit at the beginning of the encounter.  Current Medications:  Current Outpatient Medications:    atorvastatin  (LIPITOR) 10 MG tablet, TAKE 1 TABLET BY MOUTH EVERY DAY, Disp: 90 tablet, Rfl: 3   Garlic (GARLIQUE) 400 MG TBEC, Take by mouth., Disp: , Rfl:    losartan  (COZAAR ) 100 MG tablet, Take 1 tablet (100 mg total) by mouth daily., Disp: 30 tablet, Rfl: 2   Omega-3 Fatty Acids (FISH OIL) 1000 MG CAPS, Take by mouth daily., Disp: , Rfl:    OVER THE COUNTER MEDICATION, Take 1 capsule by mouth daily at 6 (six) AM. Aged beet root, Disp: , Rfl:    Medications ordered in this encounter:  No orders of the defined types were placed in this encounter.    *If you need refills on other medications prior to your next appointment, please contact your pharmacy*  Follow-Up: Call back or seek an in-person evaluation if the symptoms worsen or if the condition fails to improve as anticipated.  [facility] [phone]  Other Instructions Upper Respiratory Infection, Adult An upper respiratory infection (URI) is a common viral infection of the nose, throat, and upper air passages that lead to the lungs. The most common type of URI is the common cold. URIs usually get better on their own, without medical treatment. What are the causes? A URI is caused by a virus. You may catch a virus by: Breathing in  droplets from an infected person's cough or sneeze. Touching something that has been exposed to the virus (is contaminated) and then touching your mouth, nose, or eyes. What increases the risk? You are more likely to get a URI if: You are very young or very old. You have close contact with others, such as at work, school, or a health care facility. You smoke. You have long-term (chronic) heart or lung disease. You have a weakened disease-fighting system (immune system). You have nasal allergies or asthma. You are experiencing a lot of stress. You have poor nutrition. What are the signs or symptoms? A URI usually involves some of the following symptoms: Runny or stuffy (congested) nose. Cough. Sneezing. Sore throat. Headache. Fatigue. Fever. Loss of appetite. Pain in your forehead, behind your eyes, and over your cheekbones (sinus pain). Muscle aches. Redness or irritation of the eyes. Pressure in the ears or face. How is this diagnosed? This condition may be diagnosed based on your medical history and symptoms, and a physical exam. Your health care provider may use a swab to take a mucus sample from your nose (nasal swab). This sample can be tested to determine what virus is causing the illness. How is this treated? URIs usually get better on their own within 7-10 days. Medicines cannot cure URIs, but your health care provider may recommend certain medicines to help relieve symptoms, such as: Over-the-counter cold medicines. Cough suppressants. Coughing is a type of defense against infection that helps to clear the  respiratory system, so take these medicines only as recommended by your health care provider. Fever-reducing medicines. Follow these instructions at home: Activity Rest as needed. If you have a fever, stay home from work or school until your fever is gone or until your health care provider says your URI cannot spread to other people (is no longer contagious). Your  health care provider may have you wear a face mask to prevent your infection from spreading. Relieving symptoms Gargle with a mixture of salt and water 3-4 times a day or as needed. To make salt water, completely dissolve -1 tsp (3-6 g) of salt in 1 cup (237 mL) of warm water. Use a cool-mist humidifier to add moisture to the air. This can help you breathe more easily. Eating and drinking  Drink enough fluid to keep your urine pale yellow. Eat soups and other clear broths. General instructions  Take over-the-counter and prescription medicines only as told by your health care provider. These include cold medicines, fever reducers, and cough suppressants. Do not use any products that contain nicotine or tobacco. These products include cigarettes, chewing tobacco, and vaping devices, such as e-cigarettes. If you need help quitting, ask your health care provider. Stay away from secondhand smoke. Stay up to date on all immunizations, including the yearly (annual) flu vaccine. Keep all follow-up visits. This is important. How to prevent the spread of infection to others URIs can be contagious. To prevent the infection from spreading: Wash your hands with soap and water for at least 20 seconds. If soap and water are not available, use hand sanitizer. Avoid touching your mouth, face, eyes, or nose. Cough or sneeze into a tissue or your sleeve or elbow instead of into your hand or into the air.  Contact a health care provider if: You are getting worse instead of better. You have a fever or chills. Your mucus is brown or red. You have yellow or brown discharge coming from your nose. You have pain in your face, especially when you bend forward. You have swollen neck glands. You have pain while swallowing. You have white areas in the back of your throat. Get help right away if: You have shortness of breath that gets worse. You have severe or persistent: Headache. Ear pain. Sinus pain. Chest  pain. You have chronic lung disease along with any of the following: Making high-pitched whistling sounds when you breathe, most often when you breathe out (wheezing). Prolonged cough (more than 14 days). Coughing up blood. A change in your usual mucus. You have a stiff neck. You have changes in your: Vision. Hearing. Thinking. Mood. These symptoms may be an emergency. Get help right away. Call 911. Do not wait to see if the symptoms will go away. Do not drive yourself to the hospital. Summary An upper respiratory infection (URI) is a common infection of the nose, throat, and upper air passages that lead to the lungs. A URI is caused by a virus. URIs usually get better on their own within 7-10 days. Medicines cannot cure URIs, but your health care provider may recommend certain medicines to help relieve symptoms. This information is not intended to replace advice given to you by your health care provider. Make sure you discuss any questions you have with your health care provider. Document Revised: 10/21/2020 Document Reviewed: 10/21/2020 Elsevier Patient Education  2024 Elsevier Inc.   If you have been instructed to have an in-person evaluation today at a local Urgent Care facility, please use the link  below. It will take you to a list of all of our available [facility], including address, phone number and hours of operation. Please do not delay care.  [facility]  If you or a family member do not have a primary care provider, use the link below to schedule a visit and establish care. When you choose a [facility] primary care physician or advanced practice provider, you gain a long-term partner in health. Find a Primary Care Provider  Learn more about [facility]'s in-office and virtual care options: [facility] - Get Care Now

## 2023-08-02 ENCOUNTER — Other Ambulatory Visit: Payer: Self-pay

## 2023-08-02 ENCOUNTER — Encounter: Payer: Self-pay | Admitting: Family Medicine

## 2023-08-02 ENCOUNTER — Ambulatory Visit: Admitting: Family Medicine

## 2023-08-02 ENCOUNTER — Other Ambulatory Visit (HOSPITAL_BASED_OUTPATIENT_CLINIC_OR_DEPARTMENT_OTHER): Payer: Self-pay

## 2023-08-02 ENCOUNTER — Telehealth: Payer: Self-pay

## 2023-08-02 VITALS — BP 132/80 | HR 102 | Temp 98.0°F | Resp 16 | Ht 71.0 in | Wt 290.6 lb

## 2023-08-02 DIAGNOSIS — H6592 Unspecified nonsuppurative otitis media, left ear: Secondary | ICD-10-CM | POA: Diagnosis not present

## 2023-08-02 DIAGNOSIS — J069 Acute upper respiratory infection, unspecified: Secondary | ICD-10-CM | POA: Diagnosis not present

## 2023-08-02 DIAGNOSIS — M13169 Monoarthritis, not elsewhere classified, unspecified knee: Secondary | ICD-10-CM

## 2023-08-02 MED ORDER — PREDNISONE 20 MG PO TABS: 40.0000 mg | ORAL_TABLET | Freq: Every day | ORAL | 0 refills | Status: AC

## 2023-08-02 MED ORDER — BENZONATATE 200 MG PO CAPS
200.0000 mg | ORAL_CAPSULE | Freq: Two times a day (BID) | ORAL | 0 refills | Status: DC | PRN
Start: 2023-08-02 — End: 2023-08-02
  Filled 2023-08-02: qty 20, 10d supply, fill #0

## 2023-08-02 MED ORDER — BENZONATATE 200 MG PO CAPS: 200.0000 mg | ORAL_CAPSULE | Freq: Two times a day (BID) | ORAL | 0 refills | Status: DC | PRN

## 2023-08-02 MED ORDER — PREDNISONE 20 MG PO TABS
40.0000 mg | ORAL_TABLET | Freq: Every day | ORAL | 0 refills | Status: DC
  Filled 2023-08-02: qty 10, 5d supply, fill #0

## 2023-08-02 NOTE — Progress Notes (Signed)
 Chief Complaint  Patient presents with   Cough    Cough and Ear fullness   Ear Fullness    Brendan Gregory here for URI complaints.  Duration: 1 week  Associated symptoms: sinus headache, sinus congestion, ear fullness, ear pain, wheezing, and coughing Denies: sinus pain, rhinorrhea, itchy watery eyes, ear drainage, sore throat, shortness of breath, myalgia, and fevers Treatment to date: Mucinex, Day/Nyquil.  Sick contacts: Yes- daughter  Past Medical History:  Diagnosis Date   Agatston coronary artery calcium  score less than 100    Ca score 1.  No CAD on coronary CTA   Hypertension     Objective BP 132/80 (BP Location: Left Arm, Patient Position: Sitting)   Pulse (!) 102   Temp 98 F (36.7 C) (Oral)   Resp 16   Ht 5\' 11"  (1.803 m)   Wt 290 lb 9.6 oz (131.8 kg)   SpO2 98%   BMI 40.53 kg/m  General: Awake, alert, appears stated age HEENT: AT, [place], ears patent b/l and TM neg on R, serous fluid noted on L, nares patent w/o discharge, turb edematous and pale b/l, pharynx pink and without exudates, MMM, no sinus ttp Neck: No masses or asymmetry Heart: RRR Lungs: CTAB, no accessory muscle use Psych: Age appropriate judgment and insight, normal mood and affect  Fluid level behind tympanic membrane of left ear - Plan: predniSONE  (DELTASONE ) 20 MG tablet  Viral URI with cough - Plan: benzonatate  (TESSALON ) 200 MG capsule  5 d ped burst 40 mg/d for ear. Tessalon  Perles prn. Continue to push fluids, practice good hand hygiene, cover mouth when coughing. F/u prn. If starting to experience fevers, shaking, or shortness of breath, seek immediate care. Pt voiced understanding and agreement to the plan.  Shellie Dials [place], DO 08/02/23 10:45 AM

## 2023-08-02 NOTE — Patient Instructions (Addendum)
 Continue to push fluids, practice good hand hygiene, and cover your mouth if you cough.  If you start having fevers, shaking or shortness of breath, seek immediate care.  OK to take Tylenol  1000 mg (2 extra strength tabs) or 975 mg (3 regular strength tabs) every 6 hours as needed.  To prepare a bleach bath, one-fourth to one-half cup of bleach is placed in a full bathtub (about 40 gallons) of water. Bleach baths are usually taken for 5 to 10 minutes twice per week and should be followed by application of an emollient.  Let us  know if you need anything.

## 2023-08-02 NOTE — Telephone Encounter (Signed)
 Called reading room and was advised they will make imaging report STAT.

## 2023-08-03 ENCOUNTER — Ambulatory Visit: Admitting: Physical Therapy

## 2023-08-09 ENCOUNTER — Ambulatory Visit: Admitting: Orthopaedic Surgery

## 2023-08-09 DIAGNOSIS — M12261 Villonodular synovitis (pigmented), right knee: Secondary | ICD-10-CM | POA: Insufficient documentation

## 2023-08-09 NOTE — Progress Notes (Signed)
 Office Visit Note   Patient: Brendan Gregory           Date of Birth: May 08, 1980           MRN: [id number] Visit Date: 08/09/2023              Requested by: Jobe Mulder, DO [address] [place],  [place] [address] PCP: Jobe Mulder, DO   Assessment & Plan: Visit Diagnoses:  1. Pigmented villonodular synovitis of right knee     Plan: History of Present Illness Brendan Gregory "Brendan Gregory" is a 43 year old male who presents with right knee pain and swelling. He was referred for further evaluation after an MRI showed abnormalities in the right knee.  He has experienced right knee pain and swelling since October or November of last year. The knee feels swollen and is sensitive to touch in certain areas. Soreness occurs with certain movements, and he has difficulty squatting, feeling as though something is restricted. There is no grinding sensation, but a similar feeling is noted.  Swelling and difficulty standing up or going upstairs are present, although he can use an elliptical and walk without issues. He works in Consulting civil engineer, involving prolonged sitting, and attempts to exercise on an elliptical daily, though he has not been able to do so recently.  Physical Exam MUSCULOSKELETAL: Right knee with small to medium joint effusion, stable collateral ligaments, range of motion flexion to 95 degrees with pain.  Results RADIOLOGY Right knee x-ray: Normal Right knee MRI: Inflammation, synovitis, possible pigmented villonodular synovitis (PVNS)  Assessment and Plan Pigmented Villonodular Synovitis (PVNS) of the right knee, medial plica syndrome Chronic inflammation with MRI-confirmed PVNS, causing swelling, pain, and limited movement. Condition is locally aggressive, non-metastatic, and can damage the knee joint if untreated. Arthroscopic surgery planned to excise inflammatory tissue, with recurrence risk.  - Schedule arthroscopic surgery to remove PVNS tissue after June  25th. - Provide exercise program for post-surgery rehabilitation. - Advise use of crutches for a few days post-surgery. - Coordinate with surgery scheduler to finalize surgery date.  Follow-Up Instructions: No follow-ups on file.   Orders:  No orders of the defined types were placed in this encounter.  No orders of the defined types were placed in this encounter.     Procedures: No procedures performed   Clinical Data: No additional findings.   Subjective: Chief Complaint  Patient presents with   Right Knee - Pain    HPI  Review of Systems  Constitutional: Negative.   HENT: Negative.    Eyes: Negative.   Respiratory: Negative.    Cardiovascular: Negative.   Gastrointestinal: Negative.   Endocrine: Negative.   Genitourinary: Negative.   Skin: Negative.   Allergic/Immunologic: Negative.   Neurological: Negative.   Hematological: Negative.   Psychiatric/Behavioral: Negative.    All other systems reviewed and are negative.    Objective: Vital Signs: There were no vitals taken for this visit.  Physical Exam Vitals and nursing note reviewed.  Constitutional:      Appearance: He is well-developed.  HENT:     Head: Normocephalic and atraumatic.  Eyes:     Pupils: Pupils are equal, round, and reactive to light.  Pulmonary:     Effort: Pulmonary effort is normal.  Abdominal:     Palpations: Abdomen is soft.  Musculoskeletal:        General: Normal range of motion.     Cervical back: Neck supple.  Skin:  General: Skin is warm.  Neurological:     Mental Status: He is alert and oriented to person, place, and time.  Psychiatric:        Behavior: Behavior normal.        Thought Content: Thought content normal.        Judgment: Judgment normal.     Ortho Exam  Specialty Comments:  No specialty comments available.  Imaging: No results found.   PMFS History: Patient Active Problem List   Diagnosis Date Noted   Pigmented villonodular synovitis  of right knee 08/09/2023   Hypokalemia 03/14/2022   Acute cough 03/08/2022   Pansinusitis 03/08/2022   Weakness 03/08/2022   Panic attack 10/25/2021   Generalized anxiety disorder 10/25/2021   Solitary pulmonary nodule 05/04/2021   Agatston coronary artery calcium  score less than 100 01/25/2021   Morbid obesity (HCC) 11/24/2020   Obstructive sleep apnea on CPAP 05/16/2019   Essential hypertension 05/16/2019   Past Medical History:  Diagnosis Date   Agatston coronary artery calcium  score less than 100    Ca score 1.  No CAD on coronary CTA   Hypertension     Family History  Problem Relation Age of Onset   Healthy Mother    Prostate cancer Father 33   Diabetes Father    Hypertension Father    Diabetes Brother    Hypertension Brother    Diabetes Maternal Grandfather     Past Surgical History:  Procedure Laterality Date   MOLE REMOVAL     outpatient procedure   testicular     for undescenced testicle   Social History   Occupational History   Not on file  Tobacco Use   Smoking status: Never    Passive exposure: Never   Smokeless tobacco: Never  Vaping Use   Vaping status: Former   Substances: THC  Substance and Sexual Activity   Alcohol use: Not Currently   Drug use: Not Currently    Frequency: 7.0 times per week    Types: Marijuana    Comment: daily use marijuana smoking.  Quit 2021.   Sexual activity: Not on file

## 2023-08-14 ENCOUNTER — Telehealth: Payer: Self-pay | Admitting: Orthopaedic Surgery

## 2023-08-14 NOTE — Telephone Encounter (Signed)
 Patient has chosen 10-12-23 for his surgery date.  He will be scheduled at [facility]. He would like to know the expected downtime for the surgery and possibly a note or letter to provide employer.  Patient's call back number is [phone].

## 2023-08-14 NOTE — Telephone Encounter (Signed)
 4-6 weeks recovery.

## 2023-08-15 NOTE — Telephone Encounter (Signed)
 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 How long will he be out of work for this?

## 2023-08-15 NOTE — Telephone Encounter (Signed)
 He can go back to work from home after 2 days but it depends on how he feels.  I would start with 1 week out and then re-evaluate on a weekly basis.

## 2023-08-16 NOTE — Telephone Encounter (Signed)
Called and relayed information to patient.

## 2023-08-27 ENCOUNTER — Telehealth: Admitting: Nurse Practitioner

## 2023-08-27 DIAGNOSIS — M545 Low back pain, unspecified: Secondary | ICD-10-CM

## 2023-08-27 MED ORDER — NAPROXEN 500 MG PO TABS: 500.0000 mg | ORAL_TABLET | Freq: Two times a day (BID) | ORAL | 0 refills | Status: DC

## 2023-08-27 MED ORDER — METHOCARBAMOL 750 MG PO TABS: 750.0000 mg | ORAL_TABLET | Freq: Three times a day (TID) | ORAL | 0 refills | Status: DC | PRN

## 2023-08-27 NOTE — Patient Instructions (Signed)
 Brendan Gregory, thank you for joining [name], NP for today's virtual visit.  While this provider is not your primary care provider (PCP), if your PCP is located in our provider database this encounter information will be shared with them immediately following your visit.   Brendan Gregory account gives you access to today's visit and all your visits, tests, and labs performed at [facility] " click here if you don't have Brendan [facility] Gregory account or go to [email or web address]  Consent: (Patient) Brendan Gregory provided verbal consent for this virtual visit at the beginning of the encounter.  Current Medications:  Current Outpatient Medications:    methocarbamol (ROBAXIN) 750 MG tablet, Take 1 tablet (750 mg total) by mouth every 8 (eight) hours as needed for muscle spasms., Disp: 30 tablet, Rfl: 0   naproxen (NAPROSYN) 500 MG tablet, Take 1 tablet (500 mg total) by mouth 2 (two) times daily with Brendan meal., Disp: 30 tablet, Rfl: 0   atorvastatin  (LIPITOR) 10 MG tablet, TAKE 1 TABLET BY MOUTH EVERY DAY, Disp: 90 tablet, Rfl: 3   benzonatate  (TESSALON ) 200 MG capsule, Take 1 capsule (200 mg total) by mouth 2 (two) times daily as needed for cough., Disp: 20 capsule, Rfl: 0   Garlic (GARLIQUE) 400 MG TBEC, Take by mouth., Disp: , Rfl:    losartan  (COZAAR ) 100 MG tablet, Take 1 tablet (100 mg total) by mouth daily., Disp: 30 tablet, Rfl: 2   Omega-3 Fatty Acids (FISH OIL) 1000 MG CAPS, Take by mouth daily., Disp: , Rfl:    OVER THE COUNTER MEDICATION, Take 1 capsule by mouth daily at 6 (six) AM. Aged beet root, Disp: , Rfl:    Medications ordered in this encounter:  Meds ordered this encounter  Medications   naproxen (NAPROSYN) 500 MG tablet    Sig: Take 1 tablet (500 mg total) by mouth 2 (two) times daily with Brendan meal.    Dispense:  30 tablet    Refill:  0    Supervising Provider:   LAMPTEY, PHILIP O [[id number]]   methocarbamol (ROBAXIN) 750 MG tablet    Sig:  Take 1 tablet (750 mg total) by mouth every 8 (eight) hours as needed for muscle spasms.    Dispense:  30 tablet    Refill:  0    Supervising Provider:   LAMPTEY, PHILIP O [[id number]]     *If you need refills on other medications prior to your next appointment, please contact your pharmacy*  Follow-Up: Call back or seek an in-person evaluation if the symptoms worsen or if the condition fails to improve as anticipated.  [facility] [phone]  Other Instructions May alternate with heat and ice application for pain relief. May also alternate with acetaminophen  as prescribed for back pain. Other alternatives include massage, acupuncture and water aerobics.  Try to  stay active     If you have been instructed to have an in-person evaluation today at Brendan local Urgent Care facility, please use the link below. It will take you to Brendan list of all of our available [facility], including address, phone number and hours of operation. Please do not delay care.  [facility]  If you or Brendan family member do not have Brendan primary care provider, use the link below to schedule Brendan visit and establish care. When you choose Brendan Maceo primary care physician or advanced practice provider, you gain Brendan long-term partner in health. Find  Brendan Primary Care Provider  Learn more about [facility]'s in-office and virtual care options: [facility] - Get Care Now

## 2023-08-27 NOTE — Progress Notes (Signed)
 Virtual Visit Consent   Brendan Gregory, you are scheduled for a virtual visit with a [facility] provider today. Just as with appointments in the office, your consent must be obtained to participate. Your consent will be active for this visit and any virtual visit you may have with one of our providers in the next 365 days. If you have a MyChart account, a copy of this consent can be sent to you electronically.  As this is a virtual visit, video technology does not allow for your provider to perform a traditional examination. This may limit your provider's ability to fully assess your condition. If your provider identifies any concerns that need to be evaluated in person or the need to arrange testing (such as labs, EKG, etc.), we will make arrangements to do so. Although advances in technology are sophisticated, we cannot ensure that it will always work on either your end or our end. If the connection with a video visit is poor, the visit may have to be switched to a telephone visit. With either a video or telephone visit, we are not always able to ensure that we have a secure connection.  By engaging in this virtual visit, you consent to the provision of healthcare and authorize for your insurance to be billed (if applicable) for the services provided during this visit. Depending on your insurance coverage, you may receive a charge related to this service.  I need to obtain your verbal consent now. Are you willing to proceed with your visit today? Brendan Gregory has provided verbal consent on 08/27/2023 for a virtual visit (video or telephone). Brendan Dean, NP  Date: 08/27/2023 8:17 AM   Virtual Visit via Video Note   I, Brendan Gregory, connected with  Brendan Gregory  ([id number], 18-Apr-1980) on 08/27/23 at  8:45 AM EDT by a video-enabled telemedicine application and verified that I am speaking with the correct person using two identifiers.  Location: Patient: Virtual Visit Location Patient:  Home Provider: Virtual Visit Location Provider: Home Office   I discussed the limitations of evaluation and management by telemedicine and the availability of in person appointments. The patient expressed understanding and agreed to proceed.    History of Present Illness: Brendan Gregory is a 43 y.o. who identifies as a male who was assigned male at birth, and is being seen today for low back pain.  Brendan Gregory states he fell asleep on the couch and woke up with severe back pain. Feels like his spine is misaligned every time he tries to move. He does not endorse bowel or bladder incontinence.    Problems:  Patient Active Problem List   Diagnosis Date Noted   Pigmented villonodular synovitis of right knee 08/09/2023   Hypokalemia 03/14/2022   Acute cough 03/08/2022   Pansinusitis 03/08/2022   Weakness 03/08/2022   Panic attack 10/25/2021   Generalized anxiety disorder 10/25/2021   Solitary pulmonary nodule 05/04/2021   Agatston coronary artery calcium  score less than 100 01/25/2021   Morbid obesity (HCC) 11/24/2020   Obstructive sleep apnea on CPAP 05/16/2019   Essential hypertension 05/16/2019    Allergies:  Allergies  Allergen Reactions   Amlodipine  Nausea Only    HA and nausea   Medications:  Current Outpatient Medications:    methocarbamol (ROBAXIN) 750 MG tablet, Take 1 tablet (750 mg total) by mouth every 8 (eight) hours as needed for muscle spasms., Disp: 30 tablet, Rfl: 0   naproxen (NAPROSYN) 500 MG tablet, Take 1 tablet (500  mg total) by mouth 2 (two) times daily with a meal., Disp: 30 tablet, Rfl: 0   atorvastatin  (LIPITOR) 10 MG tablet, TAKE 1 TABLET BY MOUTH EVERY DAY, Disp: 90 tablet, Rfl: 3   benzonatate  (TESSALON ) 200 MG capsule, Take 1 capsule (200 mg total) by mouth 2 (two) times daily as needed for cough., Disp: 20 capsule, Rfl: 0   Garlic (GARLIQUE) 400 MG TBEC, Take by mouth., Disp: , Rfl:    losartan  (COZAAR ) 100 MG tablet, Take 1 tablet (100 mg total) by mouth  daily., Disp: 30 tablet, Rfl: 2   Omega-3 Fatty Acids (FISH OIL) 1000 MG CAPS, Take by mouth daily., Disp: , Rfl:    OVER THE COUNTER MEDICATION, Take 1 capsule by mouth daily at 6 (six) AM. Aged beet root, Disp: , Rfl:   Observations/Objective: Patient is well-developed, well-nourished in no acute distress.  Resting comfortably  at home.  Head is normocephalic, atraumatic.  No labored breathing. Speech is clear and coherent with logical content.  Patient is alert and oriented at baseline.    Assessment and Plan: 1. Acute bilateral low back pain without sciatica (Primary) - naproxen (NAPROSYN) 500 MG tablet; Take 1 tablet (500 mg total) by mouth 2 (two) times daily with a meal.  Dispense: 30 tablet; Refill: 0 - methocarbamol (ROBAXIN) 750 MG tablet; Take 1 tablet (750 mg total) by mouth every 8 (eight) hours as needed for muscle spasms.  Dispense: 30 tablet; Refill: 0 May alternate with heat and ice application for pain relief. May also alternate with acetaminophen  as prescribed for back pain. Other alternatives include massage, acupuncture and water aerobics.  Try to  stay active    Follow Up Instructions: I discussed the assessment and treatment plan with the patient. The patient was provided an opportunity to ask questions and all were answered. The patient agreed with the plan and demonstrated an understanding of the instructions.  A copy of instructions were sent to the patient via MyChart unless otherwise noted below.    The patient was advised to call back or seek an in-person evaluation if the symptoms worsen or if the condition fails to improve as anticipated.    Aerilyn Slee W Brendan Wilhoite, NP

## 2023-08-29 ENCOUNTER — Telehealth: Payer: Self-pay

## 2023-08-29 ENCOUNTER — Other Ambulatory Visit (HOSPITAL_COMMUNITY): Payer: Self-pay

## 2023-08-29 NOTE — Telephone Encounter (Signed)
 Message sent to pt.

## 2023-08-29 NOTE — Telephone Encounter (Signed)
 Pharmacy Patient Advocate Encounter   Received notification from CoverMyMeds that prior authorization for We[employer]  1 is required/requested.   Insurance verification completed.   The patient is insured through [facility] .   Per test claim: patient has plan benefit exclusion. See previous encounter

## 2023-08-30 ENCOUNTER — Ambulatory Visit: Admitting: Family Medicine

## 2023-09-01 ENCOUNTER — Ambulatory Visit: Admitting: Family Medicine

## 2023-09-01 ENCOUNTER — Encounter: Payer: Self-pay | Admitting: Family Medicine

## 2023-09-01 VITALS — BP 138/84 | HR 92 | Temp 98.0°F | Resp 16 | Ht 71.0 in | Wt 290.0 lb

## 2023-09-01 DIAGNOSIS — I10 Essential (primary) hypertension: Secondary | ICD-10-CM | POA: Diagnosis not present

## 2023-09-01 NOTE — Patient Instructions (Signed)
 Keep the diet clean and stay active.  Let us know if you need anything.

## 2023-09-01 NOTE — Progress Notes (Signed)
 Chief Complaint  Patient presents with   Follow-up     Follow Up    Subjective Brendan Gregory is a 43 y.o. male who presents for hypertension follow up. He does monitor home blood pressures. Blood pressures ranging from 130's/80's on average. He is compliant with medication- losartan  100 mg/d. Patient has these side effects of medication: none He is usually adhering to a healthy diet overall. Current exercise: limited 2/2 knee pain No CP or SOB.    Past Medical History:  Diagnosis Date   Agatston coronary artery calcium  score less than 100    Ca score 1.  No CAD on coronary CTA   Hypertension     Exam BP 138/84 (BP Location: Left Arm, Patient Position: Sitting)   Pulse 92   Temp 98 F (36.7 C) (Oral)   Resp 16   Ht 5\' 11"  (1.803 m)   Wt 290 lb (131.5 kg)   SpO2 98%   BMI 40.45 kg/m  General:  well developed, well nourished, in no apparent distress Heart: RRR, no bruits, no LE edema Lungs: clear to auscultation, no accessory muscle use Psych: well oriented with normal range of affect and appropriate judgment/insight  Essential hypertension  Chronic, stable. Cont losartan  100 mg/d. Counseled on diet and exercise. F/u as originally scheduled for his physical. The patient voiced understanding and agreement to the plan.  Shellie Dials [place], DO 09/01/23  1:29 PM

## 2023-09-05 ENCOUNTER — Ambulatory Visit: Admitting: Family Medicine

## 2023-09-05 ENCOUNTER — Encounter: Payer: Self-pay | Admitting: Family Medicine

## 2023-09-05 VITALS — BP 138/84 | HR 68 | Temp 98.0°F | Resp 16 | Ht 71.0 in | Wt 290.0 lb

## 2023-09-05 DIAGNOSIS — M545 Low back pain, unspecified: Secondary | ICD-10-CM | POA: Diagnosis not present

## 2023-09-05 MED ORDER — PREDNISONE 20 MG PO TABS: 40.0000 mg | ORAL_TABLET | Freq: Every day | ORAL | 0 refills | Status: AC

## 2023-09-05 MED ORDER — TIZANIDINE HCL 4 MG PO TABS
4.0000 mg | ORAL_TABLET | Freq: Four times a day (QID) | ORAL | 0 refills | Status: DC | PRN
Start: 2023-09-05 — End: 2023-11-29

## 2023-09-05 NOTE — Progress Notes (Signed)
 Musculoskeletal Exam  Patient: Brendan Gregory DOB: 13-Sep-1980  DOS: 09/05/2023  SUBJECTIVE:  Chief Complaint:   Chief Complaint  Patient presents with   Back Pain    Back Pain    Brendan Gregory is a 43 y.o.  male for evaluation and treatment of back pain.   Onset:  2 weeks ago.  Was carrying out part of a desk and slept on the couch.  Location: lower mid Character:  Shooting and tightness Progression of issue:  is unchanged Associated symptoms: very difficult moving  Denies bowel/bladder incontinence, bruising, redness, swelling or weakness Treatment: to date has been prescription NSAIDS, muscle relaxers, and home exercises.   Neurovascular symptoms: no  Past Medical History:  Diagnosis Date   Agatston coronary artery calcium  score less than 100    Ca score 1.  No CAD on coronary CTA   Hypertension     Objective:  VITAL SIGNS: BP 138/84 (BP Location: Left Arm, Patient Position: Sitting)   Pulse 68   Temp 98 F (36.7 C) (Oral)   Resp 16   Ht 5\' 11"  (1.803 m)   Wt 290 lb (131.5 kg)   SpO2 96%   BMI 40.45 kg/m  Constitutional: Well formed, well developed. No acute distress. HENT: Normocephalic, atraumatic.  Thorax & Lungs:  No accessory muscle use Musculoskeletal: low back.   Tenderness to palpation: Yes, over lumbar parasp and ES group on the L Deformity: no Ecchymosis: no Straight leg test: negative for Poor hamstring flexibility b/l. Neurologic: Normal sensory function. No focal deficits noted. DTR's equal and symmetric in LE's. No clonus. Psychiatric: Normal mood. Age appropriate judgment and insight. Alert & oriented x 3.    Assessment:  Acute bilateral low back pain without sciatica - Plan: predniSONE  (DELTASONE ) 20 MG tablet, tiZANidine (ZANAFLEX) 4 MG tablet  Plan: Stretches/exercises, heat, ice, Tylenol , 5 d pred burst 40 mg/d.  Zanaflex as needed.  If he is not able to do the stretches and exercises with the help with the medication, we may have to  consider physical therapy. F/u prn. The patient voiced understanding and agreement to the plan.   Shellie Dials [place], DO 09/05/23  3:02 PM

## 2023-09-05 NOTE — Patient Instructions (Addendum)
 Heat (pad or rice pillow in microwave) over affected area, 10-15 minutes twice daily.   Ice/cold pack over area for 10-15 min twice daily.  OK to take Tylenol  1000 mg (2 extra strength tabs or 975 mg (3 regular strength tabs) every 6 hours as needed.  Take Zanaflex (tizanidine) 1-2 hours before planned bedtime. If it makes you drowsy, do not take during the day. You can try half a tab the following night.  Let us  know if you need anything.

## 2023-09-28 ENCOUNTER — Telehealth: Payer: Self-pay | Admitting: Orthopaedic Surgery

## 2023-09-28 NOTE — Telephone Encounter (Signed)
 Patient called and ask if he could get a note for work and it needs to say the surgery date and the recovery for him being out. CB#[phone]

## 2023-09-29 ENCOUNTER — Encounter: Payer: BC Managed Care – PPO | Admitting: Family Medicine

## 2023-09-29 ENCOUNTER — Telehealth: Payer: Self-pay | Admitting: Orthopaedic Surgery

## 2023-09-29 NOTE — Telephone Encounter (Signed)
 Patient is scheduled for right knee scope synovectomy on 10/12/23 and in need of a note for his employer detailing surgery date and down time.  Also patient has court this Monday June 30th and in need of a note TODAY providing the same information as employer (surgery date and down time). Please send via my chart and/or email.    If any questions regarding this request-- please call patient directly [phone]

## 2023-09-29 NOTE — Telephone Encounter (Signed)
 I would say out of work x 6 weeks post-op

## 2023-09-29 NOTE — Telephone Encounter (Signed)
 Out of work 2 to 6 weeks

## 2023-09-29 NOTE — Telephone Encounter (Signed)
 Note entered and emailed to [email or web address] per patient request.

## 2023-10-02 ENCOUNTER — Encounter: Payer: BC Managed Care – PPO | Admitting: Family Medicine

## 2023-10-03 ENCOUNTER — Encounter: Payer: BC Managed Care – PPO | Admitting: Family Medicine

## 2023-10-05 ENCOUNTER — Other Ambulatory Visit (HOSPITAL_BASED_OUTPATIENT_CLINIC_OR_DEPARTMENT_OTHER): Payer: Self-pay

## 2023-10-05 ENCOUNTER — Other Ambulatory Visit: Payer: Self-pay | Admitting: Physician Assistant

## 2023-10-05 MED ORDER — ONDANSETRON HCL 4 MG PO TABS
4.0000 mg | ORAL_TABLET | Freq: Three times a day (TID) | ORAL | 0 refills | Status: DC | PRN
  Filled 2023-10-05: qty 40, 14d supply, fill #0

## 2023-10-05 MED ORDER — HYDROCODONE-ACETAMINOPHEN 5-325 MG PO TABS
1.0000 | ORAL_TABLET | Freq: Three times a day (TID) | ORAL | 0 refills | Status: DC | PRN
  Filled 2023-10-05: qty 21, 7d supply, fill #0

## 2023-10-12 ENCOUNTER — Encounter: Payer: Self-pay | Admitting: Orthopaedic Surgery

## 2023-10-12 ENCOUNTER — Other Ambulatory Visit: Payer: Self-pay

## 2023-10-12 DIAGNOSIS — D48116 Desmoid tumor of lower extremity and pelvic girdle: Secondary | ICD-10-CM | POA: Diagnosis not present

## 2023-10-12 DIAGNOSIS — M6751 Plica syndrome, right knee: Secondary | ICD-10-CM | POA: Diagnosis not present

## 2023-10-12 DIAGNOSIS — M94261 Chondromalacia, right knee: Secondary | ICD-10-CM | POA: Diagnosis not present

## 2023-10-12 DIAGNOSIS — M67861 Other specified disorders of synovium, right knee: Secondary | ICD-10-CM | POA: Diagnosis not present

## 2023-10-12 DIAGNOSIS — M12261 Villonodular synovitis (pigmented), right knee: Secondary | ICD-10-CM | POA: Diagnosis not present

## 2023-10-16 LAB — SURGICAL PATHOLOGY

## 2023-10-19 ENCOUNTER — Encounter: Admitting: Physician Assistant

## 2023-10-20 ENCOUNTER — Ambulatory Visit: Admitting: Orthopaedic Surgery

## 2023-10-20 DIAGNOSIS — D480 Neoplasm of uncertain behavior of bone and articular cartilage: Secondary | ICD-10-CM

## 2023-10-20 DIAGNOSIS — M12261 Villonodular synovitis (pigmented), right knee: Secondary | ICD-10-CM

## 2023-10-20 NOTE — Progress Notes (Signed)
 Post-Op Visit Note   Patient: Brendan Gregory           Date of Birth: 1980/09/03           MRN: [id number] Visit Date: 10/20/2023 PCP: Frann Mabel Mt, DO   Assessment & Plan:  Chief Complaint:  Chief Complaint  Patient presents with   Right Knee - Follow-up    Right knee scope 10/12/2023   Visit Diagnoses:  1. Pigmented villonodular synovitis of right knee   2. Synovial chondromatosis     Plan: History of Present Illness Brendan Gregory is a 43 year old male 1 week postop from right knee scope.  He experiences significant soreness and swelling in the knee, particularly around the patella, leading to a sensation of imbalance. He uses a walker due to difficulty bearing weight on the affected leg, although he can stand in one place. There is tightness and a sensation of effusion in the knee. He describes a sensation of the patella being displaced upwards, which he attributes to swelling.  He has stopped taking hydrocodone  due to adverse reactions and is currently managing symptoms with Aleve , which he finds helpful for inflammation. He describes his knee as feeling 'rickety' when standing, attributing this to post-surgical changes and quadriceps weakness.  He has a history of synovial chondromatosis, which was addressed during surgery by removing loose cartilage bodies. A thickened plica was also trimmed back. He recalls previous physical therapy for hip strength and is interested in resuming therapy to aid in his recovery.  Physical Exam MUSCULOSKELETAL: Knee effusion present.  Incisions healed.  No signs of infection.  ROM progressing.  Assessment and Plan S/p right knee scope for PVNS, synovial chondromatosis, medial plica Majority of symptoms due to effusion.  Expected to resolve. - Recommend Aleve  for inflammation management. - Suggest compression knee brace for stability. - Advise continuation of physical therapy exercises to strengthen the  knee.   Follow-Up Instructions: Return in about 4 weeks (around 11/17/2023) for with lindsey.   Orders:  Orders Placed This Encounter  Procedures   Ambulatory referral to Physical Therapy   No orders of the defined types were placed in this encounter.   Imaging: No results found.  PMFS History: Patient Active Problem List   Diagnosis Date Noted   Synovial chondromatosis 10/20/2023   Pigmented villonodular synovitis of right knee 08/09/2023   Hypokalemia 03/14/2022   Acute cough 03/08/2022   Pansinusitis 03/08/2022   Weakness 03/08/2022   Panic attack 10/25/2021   Generalized anxiety disorder 10/25/2021   Solitary pulmonary nodule 05/04/2021   Agatston coronary artery calcium  score less than 100 01/25/2021   Morbid obesity (HCC) 11/24/2020   Obstructive sleep apnea on CPAP 05/16/2019   Essential hypertension 05/16/2019   Past Medical History:  Diagnosis Date   Agatston coronary artery calcium  score less than 100    Ca score 1.  No CAD on coronary CTA   Hypertension     Family History  Problem Relation Age of Onset   Healthy Mother    Prostate cancer Father 35   Diabetes Father    Hypertension Father    Diabetes Brother    Hypertension Brother    Diabetes Maternal Grandfather     Past Surgical History:  Procedure Laterality Date   MOLE REMOVAL     outpatient procedure   testicular     for undescenced testicle   Social History   Occupational History   Not on file  Tobacco Use  Smoking status: Never    Passive exposure: Never   Smokeless tobacco: Never  Vaping Use   Vaping status: Former   Substances: THC  Substance and Sexual Activity   Alcohol use: Not Currently   Drug use: Not Currently    Frequency: 7.0 times per week    Types: Marijuana    Comment: daily use marijuana smoking.  Quit 2021.   Sexual activity: Not on file

## 2023-10-23 ENCOUNTER — Other Ambulatory Visit: Payer: Self-pay | Admitting: Family Medicine

## 2023-10-27 NOTE — Therapy (Signed)
 OUTPATIENT PHYSICAL THERAPY LOWER EXTREMITY EVALUATION   Patient Name: Fountain Derusha MRN: [id number] DOB:[birth year], 43 y.o., male Today's Date: 10/31/2023  END OF SESSION:  PT End of Session - 10/31/23 0848     Visit Number 1    Date for PT Re-Evaluation 12/26/23    Authorization Type [employer] PPO    Authorization - Visit Number 7   6 visits used earlier this year   Authorization - Number of Visits 35    PT Start Time 0848    PT Stop Time 0941    PT Time Calculation (min) 53 min    Activity Tolerance Patient tolerated treatment well    Behavior During Therapy WFL for tasks assessed/performed          Past Medical History:  Diagnosis Date   Agatston coronary artery calcium  score less than 100    Ca score 1.  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   Pigmented villonodular synovitis of right knee 08/09/2023   Hypokalemia 03/14/2022   Acute cough 03/08/2022   Pansinusitis 03/08/2022   Weakness 03/08/2022   Panic attack 10/25/2021   Generalized anxiety disorder 10/25/2021   Solitary pulmonary nodule 05/04/2021   Agatston coronary artery calcium  score less than 100 01/25/2021   Morbid obesity (HCC) 11/24/2020   Obstructive sleep apnea on CPAP 05/16/2019   Essential hypertension 05/16/2019    PCP: Frann Mabel Mt, DO   REFERRING PROVIDER: Jerri Kay HERO, MD   REFERRING DIAG:  [id number] (ICD-10-CM) - Pigmented villonodular synovitis of right knee  D48.0 (ICD-10-CM) - Synovial chondromatosis    THERAPY DIAG:  Acute pain of right knee  Stiffness of right knee, not elsewhere classified  Other abnormalities of gait and mobility  Muscle weakness (generalized)  Rationale for Evaluation and Treatment: Rehabilitation  ONSET DATE:  scope performed 10/12/2023  NEXT MD VISIT: 11/17/2023   SUBJECTIVE:   SUBJECTIVE STATEMENT:  Pt is having to use a walker d/t feeling like he has impaired balance. R knee has been swollen but, is better now. He feels less stable on the R leg when trying to place full weight on it. Feels like his knee gives out on him, weakness, and feels like he lost ROM.   PERTINENT HISTORY: H/o synovial chondromatosis, GAD, HTN, OSA, morbid obesity  PAIN:  Are you having pain? Yes: NPRS scale: 1/10 currently Pain location: Anterior knee, patellar area  Pain description: shooting, grinding feeling Aggravating factors: walking, turning in bed (5-6/10), stairs, bending knee, standing up from chair, extending the knee Relieving factors: sitting, elevating it when agitated, icing post-op    PRECAUTIONS: None  RED FLAGS: None   WEIGHT BEARING RESTRICTIONS: No  FALLS:  Has patient fallen in last 6 months? 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  DIAGNOSTIC FINDINGS:  07/18/2023-MRI OF THE RIGHT KNEE WITHOUT CONTRAST  IMPRESSION: 1. Thickening of the medial patellar plica with high-grade fissuring at the medial patellar facet articular cartilage, which can be seen in the setting of medial patellar plica syndrome. 2. Large joint effusion with regions of villous like synovial proliferation within the suprapatellar, posterior, and anterior joint  space, which could reflect synovitis/reactive synovial hypertrophy versus possible lipoma arborescens.  PATIENT SURVEYS:  LEFS: 14 / 80 = 17.5 % (Severe functional limitations)    COGNITION: Overall cognitive status: Within functional limits for tasks assessed     SENSATION: WFL  EDEMA:  Minimal Swelling at superior knee cap  R knee slightly inc'd temp   MUSCLE LENGTH: To be assessed at future visit  Hamstrings: Right  deg; Left  deg Debby test: Right  deg; Left  deg  POSTURE: rounded shoulders, forward head, flexed trunk , and d/t 2WW being short pt was fwd flexed but, improved w/ walker height adjusted   PALPATION: Mild TTP: joint line, quad tendon- reports rod feeling but not super painful   LOWER EXTREMITY ROM:  Active ROM Right eval Left eval  Knee flexion 99 134  Knee extension Supported ext: 8  Open: 18  Supported: 0 Open: 2     LOWER EXTREMITY MMT:  MMT Right eval Left eval  Hip flexion 4- 5  Hip extension 4+ 4+  Hip abduction 4-, p! At knee  5  Hip adduction 4+ 5  Hip internal rotation 4- 4+  Hip external rotation 4- 4+  Knee flexion 4- 5  Knee extension 3+; v p! 5  Ankle dorsiflexion 5 5  Ankle plantarflexion    Ankle inversion    Ankle eversion     (Blank rows = not tested)  LOWER EXTREMITY SPECIAL TESTS:  Performed R SLR: pt able to perform has pain with initial quad activation   FUNCTIONAL TESTS:  5 times sit to stand: 14.3 Noted: pt performs single leg STS using only LLE to avoid weight into RLE. Pt also used B UE support onto table to perform a few reps.   GAIT: Distance walked: clinic distance  Assistive device utilized: Environmental consultant - 2 wheeled Level of assistance: Complete Independence Comments: pt ambulated into clinic with 2WW, antalgic gait noted with inc'd Wbing on the LLE > RLE, dec'd stance time pt is limbing to offload RLE as much as possible                                                                                                                                  TREATMENT DATE:    10/31/2023 SELF CARE:  Reviewed eval findings and role of PT in addressing identified deficits as well as instruction in initial HEP (see below).  MODALITIES: Game Ready vasopneumatic compression post session to R knee  x 10 min, low compression, 34 to reduce post-exercise pain and swelling/edema     PATIENT EDUCATION:  Education details: PT eval findings, anticipated POC, and initial HEP  Person educated: Patient Education method: Explanation, Demonstration, Verbal cues, Tactile cues, and [employer] app access provided Education comprehension: verbalized understanding, returned demonstration, verbal cues required, and tactile cues required   HOME EXERCISE PROGRAM: Access Code: [email or web address] URL: [email or web address] Date: 10/31/2023 Prepared by: Eusebio Saba   Exercises - Seated Heel Slide  - 1 x daily - 7 x weekly - 3 sets - 10 reps - 5s hold - Quad Setting and Stretching  - 1 x daily - 7 x weekly - 3 sets - 10 reps - Active Straight Leg Raise with Quad Set  - 1 x daily - 7 x weekly - 1 sets - 10 reps   ASSESSMENT:  CLINICAL IMPRESSION: Zebulin Siegel is a 43 y/o M seen today for evaluation and treatment of R knee pain ~2 wks after knee scope was performed. Patient reports that since the procedure there is some swelling, which has improved over time. Pt states that he is having trouble with painful movement of the knee, RLE weakness, and balance issues that he attributes to feeling less stable requiring him to use his brother's 2WW to ambulate currently. Today's evaluation revealed deficits in knee AROM with significant losses in R knee flexion and extension limited by pain, RLE weakness (as shown in chart above), presence of mild tenderness and edema at the R knee. Upon observation, pt has an antalgic gait w/ dec'd swing phase ROM on the R, significantly dec'd WBing through the RLE. During pt's 5xSTS test  today, he stated that to perform a STS he has been completely using the LLE along to perform more of a single leg STS to avoid weight into the RLE. Kolston's pain in the R knee seems to contribute greatly to his current deficits and limitations in activities such as independent walking, squatting, sleeping (pt reported that his pain sometime interrupts his sleep). Marcel scored a 14/80=17.5% which represents presence of severe functional limitations in activities such as standing, sitting, walking, squatting, and performing light housework. Pt's walker was also adjusted at the conclusion of the eval today as it was not appropriate height (too short) and pt was able to correct his fwd trunk flx afterwards. Pt used Gameready/ice for 10 mins after eval as knee was irritated, Josh will benefit from skilled PT to address his current deficits to improve his mobility, activity tolerance with decreased pain interference.   OBJECTIVE IMPAIRMENTS: Abnormal gait, decreased activity tolerance, decreased balance, decreased coordination, decreased endurance, decreased knowledge of condition, decreased knowledge of use of DME, decreased mobility, difficulty walking, decreased ROM, decreased strength, increased edema, impaired perceived functional ability, impaired flexibility, improper body mechanics, postural dysfunction, and pain.   ACTIVITY LIMITATIONS: carrying, lifting, bending, sitting, standing, squatting, sleeping, stairs, transfers, bed mobility, locomotion level, and caring for others  PARTICIPATION LIMITATIONS: cleaning, laundry, driving, shopping, community activity, and occupation  PERSONAL FACTORS: Age, Past/current experiences, Profession, Time since onset of injury/illness/exacerbation, and 3+ comorbidities: synovial chondromatosis, GAD, HTN, OSA, morbid obesity are also affecting patient's functional outcome.   REHAB POTENTIAL: Good  CLINICAL DECISION MAKING: Evolving/moderate complexity  EVALUATION  COMPLEXITY: Moderate   GOALS: Goals reviewed with patient? Yes  SHORT TERM GOALS: Target date: 11/28/2023    Patient will be independent with initial HEP provided.  Baseline: Goal status: INITIAL  2.  Patient will increase R knee flexion  to 120 to improve patients function in walking, squatting, and stair climbing.  Baseline: R knee flx: 99 Goal status: INITIAL  3.  Patient will increase R knee extension to 0 to increase ROM, activity tolerance, and function.  Baseline: Supported extension: 8, Open Chain LAQ: 18 Goal status: INITIAL  4.  Patient will be able to ambulate clinic distances without 2WW for balance/stability.  Baseline: Pt is using 2WW for all ambulation  Goal status: INITIAL   LONG TERM GOALS: Target date: 11/28/2023    Patient will be independent to advanced HEP.  Baseline:  Goal status: INITIAL  2.  Patient will demonstrate independent ambulation with evenly distributed weight into the RLE.  Baseline: Pt avoids putting weight into RLE, uses 2WW  Goal status: INITIAL  3.  Patient will improve his LEFS score to at least 61/80 to promote minimal disability to normal function in ADLs.  Baseline: 14/80=17.5% Goal status: INITIAL  4.  Patient will improve R knee flexion ROM to >/= 125  to promote and maintain patients ability to perform stair negotiation at home.  Baseline: R knee flx: 99 Goal status: INITIAL   PLAN:  PT FREQUENCY: 2x/week  PT DURATION: 8 weeks  PLANNED INTERVENTIONS: 02835- PT Re-evaluation, 97750- Physical Performance Testing, 97110-Therapeutic exercises, 97530- Therapeutic activity, 97112- Neuromuscular re-education, 97535- Self Care, 02859- Manual therapy, [phone]- Gait training, [phone]- Aquatic Therapy, [phone]- Electrical stimulation (unattended), [phone]- Electrical stimulation (manual), [id number]- Ultrasound, [id number]- Ionotophoresis 4mg /ml Dexamethasone, 79439 (1-2 muscles), [id number] (3+ muscles)- Dry Needling, Patient/Family education, Balance  training, Stair training, Taping, Joint mobilization, Scar mobilization, DME instructions, Cryotherapy, and Moist heat  PLAN FOR NEXT SESSION: Review/ Update initial HEP as needed; continue ROM exercises to address ext/flx limitations; address tendencies to avoid weight shifting into RLE during gait and in stance (to address antalgic gait); introduce pt to gait w/ use of cane as tolerated    Clayson Riling, Student-PT 10/31/2023, 12:52 PM

## 2023-10-31 ENCOUNTER — Other Ambulatory Visit: Payer: Self-pay

## 2023-10-31 ENCOUNTER — Ambulatory Visit: Attending: Orthopaedic Surgery | Admitting: Physical Therapy

## 2023-10-31 ENCOUNTER — Encounter: Payer: Self-pay | Admitting: Physical Therapy

## 2023-10-31 DIAGNOSIS — R2689 Other abnormalities of gait and mobility: Secondary | ICD-10-CM | POA: Diagnosis not present

## 2023-10-31 DIAGNOSIS — M25561 Pain in right knee: Secondary | ICD-10-CM | POA: Diagnosis not present

## 2023-10-31 DIAGNOSIS — M6281 Muscle weakness (generalized): Secondary | ICD-10-CM | POA: Diagnosis not present

## 2023-10-31 DIAGNOSIS — D480 Neoplasm of uncertain behavior of bone and articular cartilage: Secondary | ICD-10-CM | POA: Diagnosis not present

## 2023-10-31 DIAGNOSIS — M12261 Villonodular synovitis (pigmented), right knee: Secondary | ICD-10-CM | POA: Diagnosis not present

## 2023-10-31 DIAGNOSIS — M25661 Stiffness of right knee, not elsewhere classified: Secondary | ICD-10-CM | POA: Diagnosis not present

## 2023-11-08 ENCOUNTER — Ambulatory Visit: Attending: Orthopaedic Surgery

## 2023-11-08 DIAGNOSIS — M25661 Stiffness of right knee, not elsewhere classified: Secondary | ICD-10-CM | POA: Insufficient documentation

## 2023-11-08 DIAGNOSIS — R2689 Other abnormalities of gait and mobility: Secondary | ICD-10-CM | POA: Insufficient documentation

## 2023-11-08 DIAGNOSIS — G8929 Other chronic pain: Secondary | ICD-10-CM | POA: Insufficient documentation

## 2023-11-08 DIAGNOSIS — R600 Localized edema: Secondary | ICD-10-CM | POA: Insufficient documentation

## 2023-11-08 DIAGNOSIS — M6281 Muscle weakness (generalized): Secondary | ICD-10-CM | POA: Insufficient documentation

## 2023-11-08 DIAGNOSIS — M25561 Pain in right knee: Secondary | ICD-10-CM | POA: Insufficient documentation

## 2023-11-10 ENCOUNTER — Ambulatory Visit: Admitting: Physical Therapy

## 2023-11-10 ENCOUNTER — Encounter: Payer: Self-pay | Admitting: Physical Therapy

## 2023-11-10 DIAGNOSIS — M6281 Muscle weakness (generalized): Secondary | ICD-10-CM

## 2023-11-10 DIAGNOSIS — M25661 Stiffness of right knee, not elsewhere classified: Secondary | ICD-10-CM | POA: Diagnosis not present

## 2023-11-10 DIAGNOSIS — G8929 Other chronic pain: Secondary | ICD-10-CM | POA: Diagnosis not present

## 2023-11-10 DIAGNOSIS — R600 Localized edema: Secondary | ICD-10-CM | POA: Diagnosis not present

## 2023-11-10 DIAGNOSIS — R2689 Other abnormalities of gait and mobility: Secondary | ICD-10-CM

## 2023-11-10 DIAGNOSIS — M25561 Pain in right knee: Secondary | ICD-10-CM | POA: Diagnosis not present

## 2023-11-10 NOTE — Therapy (Signed)
 OUTPATIENT PHYSICAL THERAPY TREATMENT    Patient Name: Brendan Gregory MRN: [id number] DOB:[birth year], 43 y.o., male Today's Date: 11/10/2023  END OF SESSION:  PT End of Session - 11/10/23 0931     Visit Number 2    Date for PT Re-Evaluation 12/26/23    Authorization Type [employer] PPO    Authorization - Visit Number 8    Authorization - Number of Visits 35    PT Start Time [phone]    PT Stop Time 1015    PT Time Calculation (min) 44 min    Activity Tolerance Patient tolerated treatment well    Behavior During Therapy WFL for tasks assessed/performed          Past Medical History:  Diagnosis Date   Agatston coronary artery calcium  score less than 100    Ca score 1.  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   Pigmented villonodular synovitis of right knee 08/09/2023   Hypokalemia 03/14/2022   Acute cough 03/08/2022   Pansinusitis 03/08/2022   Weakness 03/08/2022   Panic attack 10/25/2021   Generalized anxiety disorder 10/25/2021   Solitary pulmonary nodule 05/04/2021   Agatston coronary artery calcium  score less than 100 01/25/2021   Morbid obesity (HCC) 11/24/2020   Obstructive sleep apnea on CPAP 05/16/2019   Essential hypertension 05/16/2019    PCP: Frann Mabel Mt, DO   REFERRING PROVIDER: Jerri Kay HERO, MD   REFERRING DIAG:  [id number] (ICD-10-CM) - Pigmented villonodular synovitis of right knee  D48.0 (ICD-10-CM) - Synovial chondromatosis   THERAPY DIAG:  Acute pain of right knee  Stiffness of right knee, not elsewhere classified  Other abnormalities of gait and mobility  Muscle weakness (generalized)  RATIONALE FOR EVALUATION AND TREATMENT: Rehabilitation  ONSET DATE: scope performed 10/12/2023  NEXT MD VISIT:  11/17/2023   SUBJECTIVE:   SUBJECTIVE STATEMENT: Pt reports he has been able to walk w/o the RW for ~1 week now.  Still feels like he needs to work on balance and strength.  Reports he has completed the HEP 6 out of 8 days  PERTINENT HISTORY: H/o synovial chondromatosis, GAD, HTN, OSA, morbid obesity  PAIN:  Are you having pain? Yes: NPRS scale: 1-2/10  Pain location: Anterior knee, patellar area  Pain description: shooting, grinding feeling Aggravating factors: walking, turning in bed (5-6/10), stairs, bending knee, standing up from chair, extending the knee Relieving factors: sitting, elevating it when agitated, icing post-op    PRECAUTIONS: None  RED FLAGS: None   WEIGHT BEARING RESTRICTIONS: No  FALLS:  Has patient fallen in last 6 months? 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  DIAGNOSTIC FINDINGS:  07/18/2023-MRI OF THE RIGHT  KNEE WITHOUT CONTRAST  IMPRESSION: 1. Thickening of the medial patellar plica with high-grade fissuring at the medial patellar facet articular cartilage, which can be seen in the setting of medial patellar plica syndrome. 2. Large joint effusion with regions of villous like synovial proliferation within the suprapatellar, posterior, and anterior joint space, which could reflect synovitis/reactive synovial hypertrophy versus possible lipoma arborescens.  PATIENT SURVEYS:  LEFS: 14 / 80 =  17.5 % (Severe functional limitations)    COGNITION: Overall cognitive status: Within functional limits for tasks assessed     SENSATION: WFL  EDEMA:  Minimal Swelling at superior knee cap  R knee slightly inc'd temp   MUSCLE LENGTH: To be assessed at future visit  Hamstrings: Right  deg; Left  deg Debby test: Right  deg; Left  deg  POSTURE: rounded shoulders, forward head, flexed trunk , and d/t 2WW being short pt was fwd flexed but, improved w/ walker height adjusted   PALPATION: Mild TTP: joint line, quad tendon- reports rod feeling but not super painful   LOWER EXTREMITY ROM:  Active ROM Right eval Left eval R 11/10/23  Knee flexion 99 134 119  Knee extension Supported ext: 8  Open: 18  Supportedext: 0 Open: 2 Supported ext: 5  Open: 12    LOWER EXTREMITY MMT:  MMT Right eval Left eval  Hip flexion 4- 5  Hip extension 4+ 4+  Hip abduction 4-, p! At knee  5  Hip adduction 4+ 5  Hip internal rotation 4- 4+  Hip external rotation 4- 4+  Knee flexion 4- 5  Knee extension 3+; v p! 5  Ankle dorsiflexion 5 5  Ankle plantarflexion    Ankle inversion    Ankle eversion     (Blank rows = not tested)  LOWER EXTREMITY SPECIAL TESTS:  Performed R SLR: pt able to perform has pain with initial quad activation   FUNCTIONAL TESTS:  5 times sit to stand: 14.3 Noted: pt performs single leg STS using only LLE to avoid weight into RLE. Pt also used B UE support onto table to perform a few reps.   GAIT: Distance walked: clinic distance  Assistive device utilized: Environmental consultant - 2 wheeled Level of assistance: Complete Independence Comments: pt ambulated into clinic with 2WW, antalgic gait noted with inc'd Wbing on the LLE > RLE, dec'd stance time pt is limbing to offload RLE as much as possible                                                                                                                                TREATMENT DATE:    11/10/2023 THERAPEUTIC EXERCISE: To improve  strength, endurance, and ROM.  Demonstration, verbal and tactile cues throughout for technique.  Rec Bike - L1 x 6 min Supine R quad set + SLR 2# x 10 Hooklying R SAQ 2# 2 x 10 Seated R GTB HS curl 2 x 10  NEUROMUSCULAR RE-EDUCATION: To  improve balance, coordination, kinesthesia, and proprioception. Standing with UE support on wall ladder for balance:  Quad set + SLR 2# x 10 bil Alt hip ABD 2# x 10 Alt hip ext 2# x 10 Mini-squat x 6, then x 4 after seated rest break- cues for posterior weight shift, avoiding knees forward of toes R GTB TKE 2 x 10   10/31/2023 SELF CARE:  Reviewed eval findings and role of PT in addressing identified deficits as well as instruction in initial HEP (see below).  MODALITIES: Game Ready vasopneumatic compression post session to R knee x 10 min, low compression, 34 to reduce post-exercise pain and swelling/edema     PATIENT EDUCATION:  Education details: progress with PT, HEP review, and HEP update  Person educated: Patient Education method: Explanation, Demonstration, Verbal cues, Handouts, and [email or web address] app updated Education comprehension: verbalized understanding, returned demonstration, and verbal cues required   HOME EXERCISE PROGRAM: Access Code: [email or web address] URL: [email or web address] Date: 11/10/2023 Prepared by: Elijah Hidden  Exercises - Seated Heel Slide  - 1 x daily - 7 x weekly - 3 sets - 10 reps - 5s hold - Quad Setting and Stretching  - 1 x daily - 7 x weekly - 3 sets - 10 reps - Active Straight Leg Raise with Quad Set  - 1 x daily - 7 x weekly - 1 sets - 10 reps - Seated Hamstring Curl with Anchored Resistance  - 1 x daily - 3 x weekly - 2 sets - 10 reps - 3 sec hold - Standing Terminal Knee Extension with Resistance  - 1 x daily - 3 x weekly - 2 sets - 10 reps - 5 sec hold - Standing Hip Flexion with Resistance Loop  - 1 x daily - 3 x weekly - 2 sets - 10 reps - 3 sec hold - Hip Abduction with Resistance Loop  - 1 x  daily - 3 x weekly - 2 sets - 10 reps - 3 sec hold - Hip Extension with Resistance Loop  - 1 x daily - 3 x weekly - 2 sets - 10 reps - 3 sec hold   ASSESSMENT:  CLINICAL IMPRESSION: Brendan Gregory arrives to PT w/o the RW today and demonstrates good step pattern w/o significant favoring of R LE.  R knee ROM has improved since eval with AROM now 12-119 and supported extension improved to 5.  He reports no issues with the initial HEP, completing the exercises 6 out of 8 days since the eval.  He reports attempting to add TB (not sure of color) resistance to SLR at home but when attempted to add 2# during session, he noted increased R hip flexor strain.  Modified SLR with weights to standing with better tolerance but more difficulty noted when attempting L SLR in R SLS - cueing needed to avoid trying to lock R knee into hyperextension.  HEP updated at patient request to reflect some of exercise progression.  Comings will benefit from continued skilled PT to address ongoing ROM, strength and balance deficits to improve mobility and activity tolerance with decreased pain interference.   EVAL: Brendan Gregory is a 43 y/o M seen today for evaluation and treatment of R knee pain ~2 wks after knee scope was performed. Patient reports that since the procedure there is some swelling, which has improved over time. Pt states that he is having trouble with painful movement of the knee, RLE weakness, and balance issues that he attributes to feeling less stable requiring  him to use his brother's 2WW to ambulate currently. Today's evaluation revealed deficits in knee AROM with significant losses in R knee flexion and extension limited by pain, RLE weakness (as shown in chart above), presence of mild tenderness and edema at the R knee. Upon observation, pt has an antalgic gait w/ dec'd swing phase ROM on the R, significantly dec'd WBing through the RLE. During pt's 5xSTS test today, he stated that to perform a STS he has  been completely using the LLE along to perform more of a single leg STS to avoid weight into the RLE. Kailyn's pain in the R knee seems to contribute greatly to his current deficits and limitations in activities such as independent walking, squatting, sleeping (pt reported that his pain sometime interrupts his sleep). Jerrick scored a 14/80=17.5% which represents presence of severe functional limitations in activities such as standing, sitting, walking, squatting, and performing light housework. Pt's walker was also adjusted at the conclusion of the eval today as it was not appropriate height (too short) and pt was able to correct his fwd trunk flx afterwards. Pt used Gameready/ice for 10 mins after eval as knee was irritated, Josh will benefit from skilled PT to address his current deficits to improve his mobility, activity tolerance with decreased pain interference.   OBJECTIVE IMPAIRMENTS: Abnormal gait, decreased activity tolerance, decreased balance, decreased coordination, decreased endurance, decreased knowledge of condition, decreased knowledge of use of DME, decreased mobility, difficulty walking, decreased ROM, decreased strength, increased edema, impaired perceived functional ability, impaired flexibility, improper body mechanics, postural dysfunction, and pain.   ACTIVITY LIMITATIONS: carrying, lifting, bending, sitting, standing, squatting, sleeping, stairs, transfers, bed mobility, locomotion level, and caring for others  PARTICIPATION LIMITATIONS: cleaning, laundry, driving, shopping, community activity, and occupation  PERSONAL FACTORS: Age, Past/current experiences, Profession, Time since onset of injury/illness/exacerbation, and 3+ comorbidities: synovial chondromatosis, GAD, HTN, OSA, morbid obesity are also affecting patient's functional outcome.   REHAB POTENTIAL: Good  CLINICAL DECISION MAKING: Evolving/moderate complexity  EVALUATION COMPLEXITY: Moderate   GOALS: Goals  reviewed with patient? Yes  SHORT TERM GOALS: Target date: 11/28/2023    Patient will be independent with initial HEP provided.  Baseline: Goal status: IN PROGRESS - 11/10/23   2.  Patient will increase R knee flexion to 120 to improve patients function in walking, squatting, and stair climbing.  Baseline: R knee flx: 99 Goal status: IN PROGRESS - 11/10/23 - R knee flexion AROM 119  3.  Patient will increase R knee extension to 0 to increase ROM, activity tolerance, and function.  Baseline: Supported extension: 8, Open Chain LAQ: 18 Goal status: IN PROGRESS - 11/10/23 - R knee extension AROM 12 open chain, supported extension 5  4.  Patient will be able to ambulate clinic distances without 2WW for balance/stability.  Baseline: Pt is using 2WW for all ambulation  Goal status: MET - 11/10/23   LONG TERM GOALS: Target date: 11/28/2023    Patient will be independent to advanced HEP.  Baseline:  Goal status: INITIAL  2.  Patient will demonstrate independent ambulation with evenly distributed weight into the RLE.  Baseline: Pt avoids putting weight into RLE, uses 2WW  Goal status: INITIAL  3.  Patient will improve his LEFS score to at least 61/80 to promote minimal disability to normal function in ADLs.  Baseline: 14/80=17.5% Goal status: INITIAL  4.  Patient will improve R knee flexion ROM to >/= 125  to promote and maintain patients ability to perform stair negotiation at home.  Baseline: R knee flx: 99 Goal status: INITIAL   PLAN:  PT FREQUENCY: 2x/week  PT DURATION: 8 weeks  PLANNED INTERVENTIONS: [id number]- PT Re-evaluation, 97750- Physical Performance Testing, 97110-Therapeutic exercises, 97530- Therapeutic activity, 97112- Neuromuscular re-education, 97535- Self Care, [id number]- Manual therapy, [phone]- Gait training, [phone]- Aquatic Therapy, [phone]- Electrical stimulation (unattended), [phone]- Electrical stimulation (manual), [id number]- Ultrasound, [id number]- Ionotophoresis 4mg /ml Dexamethasone,  [id number] (1-2 muscles), [id number] (3+ muscles)- Dry Needling, Patient/Family education, Balance training, Stair training, Taping, Joint mobilization, Scar mobilization, DME instructions, Cryotherapy, and Moist heat  PLAN FOR NEXT SESSION: Continue ROM exercises to address ext/flx limitations; progress strengthening esp in standing to promote improved proprioception and balance; review/update HEP PRN  Elijah CHRISTELLA Hidden, PT 11/10/2023, 10:33 AM

## 2023-11-15 ENCOUNTER — Encounter: Payer: Self-pay | Admitting: Physical Therapy

## 2023-11-15 ENCOUNTER — Ambulatory Visit: Admitting: Physical Therapy

## 2023-11-15 DIAGNOSIS — M25661 Stiffness of right knee, not elsewhere classified: Secondary | ICD-10-CM

## 2023-11-15 DIAGNOSIS — M6281 Muscle weakness (generalized): Secondary | ICD-10-CM | POA: Diagnosis not present

## 2023-11-15 DIAGNOSIS — R600 Localized edema: Secondary | ICD-10-CM | POA: Diagnosis not present

## 2023-11-15 DIAGNOSIS — G8929 Other chronic pain: Secondary | ICD-10-CM | POA: Diagnosis not present

## 2023-11-15 DIAGNOSIS — R2689 Other abnormalities of gait and mobility: Secondary | ICD-10-CM | POA: Diagnosis not present

## 2023-11-15 DIAGNOSIS — M25561 Pain in right knee: Secondary | ICD-10-CM | POA: Diagnosis not present

## 2023-11-15 NOTE — Therapy (Signed)
 OUTPATIENT PHYSICAL THERAPY TREATMENT  Progress Note  Reporting Period 10/31/2023 to 11/15/2023  See note below for Objective Data and Assessment of Progress/Goals.     Patient Name: Brendan Gregory MRN: [id number] DOB:11/22/1980, 43 y.o., male Today's Date: 11/15/2023  END OF SESSION:  PT End of Session - 11/15/23 1151     Visit Number 3    Date for PT Re-Evaluation 12/26/23    Authorization Type [employer] PPO    Authorization - Visit Number 9    Authorization - Number of Visits 35    PT Start Time 1151   Pt arrived late   Activity Tolerance Patient tolerated treatment well    Behavior During Therapy [facility] for tasks assessed/performed           Past Medical History:  Diagnosis Date   Agatston coronary artery calcium  score less than 100    Ca score 1.  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   Pigmented villonodular synovitis of right knee 08/09/2023   Hypokalemia 03/14/2022   Acute cough 03/08/2022   Pansinusitis 03/08/2022   Weakness 03/08/2022   Panic attack 10/25/2021   Generalized anxiety disorder 10/25/2021   Solitary pulmonary nodule 05/04/2021   Agatston coronary artery calcium  score less than 100 01/25/2021   Morbid obesity (HCC) 11/24/2020   Obstructive sleep apnea on CPAP 05/16/2019   Essential hypertension 05/16/2019    PCP: Frann Mabel Mt, DO   REFERRING PROVIDER: Jerri Kay HERO, MD   REFERRING DIAG:  [phone] (ICD-10-CM) - Pigmented villonodular synovitis of right knee  D48.0 (ICD-10-CM) - Synovial chondromatosis   THERAPY DIAG:  Acute pain of right knee  Stiffness of right knee, not elsewhere classified  Other abnormalities of gait and mobility  Muscle weakness (generalized)  RATIONALE FOR EVALUATION AND  TREATMENT: Rehabilitation  ONSET DATE: scope performed 10/12/2023  NEXT MD VISIT: 11/17/2023   SUBJECTIVE:   SUBJECTIVE STATEMENT: Pt reports was in the pool over then weekend and notes his knee felt weird like the water was compressing his knee.  He states he attempted to come up the stairs to PT today - some increased pain noted.  PERTINENT HISTORY: H/o synovial chondromatosis, GAD, HTN, OSA, morbid obesity  PAIN:  Are you having pain? Yes: NPRS scale: 3/10  Pain location: Anterior knee, patellar area  Pain description: shooting, grinding feeling Aggravating factors: walking, turning in bed (5-6/10), stairs, bending knee, standing up from chair, extending the knee Relieving factors: sitting, elevating it when agitated, icing post-op    PRECAUTIONS: None  RED FLAGS: None   WEIGHT BEARING RESTRICTIONS: No  FALLS:  Has patient fallen in last 6 months? 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  DIAGNOSTIC FINDINGS:  07/18/2023-MRI OF THE RIGHT KNEE WITHOUT CONTRAST  IMPRESSION: 1. Thickening of the medial patellar plica with high-grade fissuring at the medial patellar facet articular cartilage, which can be seen in the setting of medial patellar plica syndrome. 2. Large joint effusion with regions of villous like synovial proliferation within the suprapatellar, posterior, and anterior joint space, which could reflect  synovitis/reactive synovial hypertrophy versus possible lipoma arborescens.  PATIENT SURVEYS:  LEFS: 14 / 80 = 17.5 % (Severe functional limitations)    COGNITION: Overall cognitive status: Within functional limits for tasks assessed     SENSATION: WFL  EDEMA:  Minimal Swelling at superior knee cap  R knee slightly inc'd temp   MUSCLE LENGTH: (11/15/23) Hamstrings: mod tight R ITB: mod/severe R  Piriformis: WFL Hip flexors: mild/mod tight R Quads: mod tight R Heelcord: mild tight R   POSTURE: rounded shoulders, forward head, flexed trunk , and d/t 2WW being short pt was fwd flexed but, improved w/ walker height adjusted   PALPATION: Mild TTP: joint line, quad tendon- reports rod feeling but not super painful   LOWER EXTREMITY ROM:  Active ROM Right eval Left eval R 11/10/23 R 11/15/23  Knee flexion 99 134 119 125  Knee extension Supported ext: 8  Open: 18  Supportedext: 0 Open: 2 Supported ext: 5  Open: 12 Supported ext: 0  Open: 6    LOWER EXTREMITY MMT:  MMT Right eval Left eval  Hip flexion 4- 5  Hip extension 4+ 4+  Hip abduction 4-, p! At knee  5  Hip adduction 4+ 5  Hip internal rotation 4- 4+  Hip external rotation 4- 4+  Knee flexion 4- 5  Knee extension 3+; v p! 5  Ankle dorsiflexion 5 5  Ankle plantarflexion    Ankle inversion    Ankle eversion     (Blank rows = not tested)  LOWER EXTREMITY SPECIAL TESTS:  Performed R SLR: pt able to perform has pain with initial quad activation   FUNCTIONAL TESTS:  5 times sit to stand: 14.3 Noted: pt performs single leg STS using only LLE to avoid weight into RLE. Pt also used B UE support onto table to perform a few reps.   GAIT: Distance walked: clinic distance  Assistive device utilized: Environmental consultant - 2 wheeled Level of assistance: Complete Independence Comments: pt ambulated into clinic with 2WW, antalgic gait noted with inc'd Wbing on the LLE > RLE, dec'd stance time pt is limbing to offload RLE as much as  possible                                                                                                                                TREATMENT DATE:    11/15/2023 THERAPEUTIC EXERCISE: To improve strength, endurance, ROM, and flexibility.  Demonstration, verbal and tactile cues throughout for technique.  Rec Bike - L2 x 6 min Hooklying R HS stretch +  DF for calf stretch with strap 2 x 30 Supine R ITB stretch with strap 2 x 30 Prone quad/RF stretch with strap and towel roll under distal thigh 2 x 30 Mod thomas quad/hip flexor stretch 2 x 30  MANUAL THERAPY: To promote normalized muscle tension, improved flexibility, improved joint mobility, increased ROM, reduced pain, and reduced edema utilizing joint mobilization, connective tissue massage, and scar mobilization. Scar massage to R knee portal incisions - increased TTP over lateral portal XFM to R patellar tendon R knee medial patellar glides  NEUROMUSCULAR RE-EDUCATION: To improve coordination and kinesthesia. B SAQ over 8 FR + ADD ball squeeze btw ankles to increase VMO activation 2 x 10   11/10/2023 THERAPEUTIC EXERCISE: To improve strength, endurance, and ROM.  Demonstration, verbal and tactile cues throughout for technique.  Rec Bike - L1 x 6 min Supine R quad set + SLR 2# x 10 Hooklying R SAQ 2# 2 x 10 Seated R GTB HS curl 2 x 10  NEUROMUSCULAR RE-EDUCATION: To improve balance, coordination, kinesthesia, and proprioception. Standing with UE support on wall ladder for balance:  Quad set + SLR 2# x 10 bil Alt hip ABD 2# x 10 Alt hip ext 2# x 10 Mini-squat x 6, then x 4 after seated rest break- cues for posterior weight shift, avoiding knees forward of toes R GTB TKE 2 x 10   10/31/2023 SELF CARE:  Reviewed eval findings and role of PT in addressing identified deficits as well as instruction in initial HEP (see below).  MODALITIES: Game Ready vasopneumatic compression post session to R knee x 10 min, low compression,  34 to reduce post-exercise pain and swelling/edema     PATIENT EDUCATION:  Education details: progress with PT and HEP update  Person educated: Patient Education method: Explanation, Demonstration, Verbal cues, Handouts, and MedBridgeGO app updated Education comprehension: verbalized understanding, returned demonstration, and verbal cues required   HOME EXERCISE PROGRAM: Access Code: [email or web address] URL: [email or web address] Date: 11/15/2023 Prepared by: Elijah Hidden  Exercises - Seated Heel Slide  - 1 x daily - 7 x weekly - 3 sets - 10 reps - 5s hold - Quad Setting and Stretching  - 1 x daily - 7 x weekly - 3 sets - 10 reps - Active Straight Leg Raise with Quad Set  - 1 x daily - 7 x weekly - 1 sets - 10 reps - Seated Hamstring Curl with Anchored Resistance  - 1 x daily - 3 x weekly - 2 sets - 10 reps - 3 sec hold - Standing Terminal Knee Extension with Resistance  - 1 x daily - 3 x weekly - 2 sets - 10 reps - 5 sec hold - Standing Hip Flexion with Resistance Loop  - 1 x daily - 3 x weekly - 2 sets - 10 reps - 3 sec hold - Hip Abduction with Resistance Loop  - 1 x daily - 3 x weekly - 2 sets - 10 reps - 3 sec hold - Hip Extension with Resistance Loop  - 1 x daily - 3 x weekly - 2 sets - 10 reps - 3 sec hold - Supine Hamstring Stretch with Strap  - 2-3 x daily - 7 x weekly - 3 reps - 30 sec hold - Supine Iliotibial Band Stretch with Strap  - 2-3 x daily - 7 x weekly - 3 reps - 30 sec hold - Prone Quad Stretch with Towel Roll and Strap  - 2-3 x daily - 7 x weekly -  3 reps - 30 sec hold - Anterior Knee Scar Massage  - 2-3 x daily - 7 x weekly - 1-2 minute hold - Short Arc Quad with [employer]  - 1 x daily - 7 x weekly - 2 sets - 10 reps - 3 sec hold   ASSESSMENT:  CLINICAL IMPRESSION: Aydrien Froman reports continued limitation with stair negotiation due to increased pain.  He notes sensation of tightness across anterior knee at level on portal incisions, therefore provided  instruction in scar massage and cross-friction massage for patellar tendon.  Increased tightness noted in lateral knee muscle groups with decreased VMO activation, therefore focused therapeutic exercises on stretches for lateral structures while focusing on VMO activation during quad strengthening to improve patellar tracking.  Comings is making good progress with R knee ROM with AROM now 6-125 and supported extension improved to 0.  Majority of STGs now met and Josh will benefit from continued skilled PT to address ongoing ROM, strength and balance deficits to improve mobility and activity tolerance with decreased pain interference.   EVAL: Jeremaine Maraj is a 43 y/o M seen today for evaluation and treatment of R knee pain ~2 wks after knee scope was performed. Patient reports that since the procedure there is some swelling, which has improved over time. Pt states that he is having trouble with painful movement of the knee, RLE weakness, and balance issues that he attributes to feeling less stable requiring him to use his brother's 2WW to ambulate currently. Today's evaluation revealed deficits in knee AROM with significant losses in R knee flexion and extension limited by pain, RLE weakness (as shown in chart above), presence of mild tenderness and edema at the R knee. Upon observation, pt has an antalgic gait w/ dec'd swing phase ROM on the R, significantly dec'd WBing through the RLE. During pt's 5xSTS test today, he stated that to perform a STS he has been completely using the LLE along to perform more of a single leg STS to avoid weight into the RLE. Yohan's pain in the R knee seems to contribute greatly to his current deficits and limitations in activities such as independent walking, squatting, sleeping (pt reported that his pain sometime interrupts his sleep). Keanu scored a 14/80=17.5% which represents presence of severe functional limitations in activities such as standing, sitting, walking,  squatting, and performing light housework. Pt's walker was also adjusted at the conclusion of the eval today as it was not appropriate height (too short) and pt was able to correct his fwd trunk flx afterwards. Pt used Gameready/ice for 10 mins after eval as knee was irritated, Josh will benefit from skilled PT to address his current deficits to improve his mobility, activity tolerance with decreased pain interference.   OBJECTIVE IMPAIRMENTS: Abnormal gait, decreased activity tolerance, decreased balance, decreased coordination, decreased endurance, decreased knowledge of condition, decreased knowledge of use of DME, decreased mobility, difficulty walking, decreased ROM, decreased strength, increased edema, impaired perceived functional ability, impaired flexibility, improper body mechanics, postural dysfunction, and pain.   ACTIVITY LIMITATIONS: carrying, lifting, bending, sitting, standing, squatting, sleeping, stairs, transfers, bed mobility, locomotion level, and caring for others  PARTICIPATION LIMITATIONS: cleaning, laundry, driving, shopping, community activity, and occupation  PERSONAL FACTORS: Age, Past/current experiences, Profession, Time since onset of injury/illness/exacerbation, and 3+ comorbidities: synovial chondromatosis, GAD, HTN, OSA, morbid obesity are also affecting patient's functional outcome.   REHAB POTENTIAL: Good  CLINICAL DECISION MAKING: Evolving/moderate complexity  EVALUATION COMPLEXITY: Moderate   GOALS: Goals reviewed with  patient? Yes  SHORT TERM GOALS: Target date: 11/28/2023    Patient will be independent with initial HEP provided.  Baseline: Goal status: MET - 11/15/23   2.  Patient will increase R knee flexion to 120 to improve patients function in walking, squatting, and stair climbing.  Baseline: R knee flx: 99 11/10/23 - R knee flexion AROM 119 Goal status: MET - 11/15/23 - R knee flexion AROM 125  3.  Patient will increase R knee extension to 0  to increase ROM, activity tolerance, and function.  Baseline: Supported extension: 8, Open Chain LAQ: 18 11/10/23 - R knee extension AROM 12 open chain, supported extension 5 Goal status: PARTIALLY MET - 11/15/23 - R knee extension AROM 6 open chain, supported extension to 0  4.  Patient will be able to ambulate clinic distances without 2WW for balance/stability.  Baseline: Pt is using 2WW for all ambulation  Goal status: MET - 11/10/23   LONG TERM GOALS: Target date: 11/28/2023    Patient will be independent to advanced HEP.  Baseline:  Goal status: INITIAL  2.  Patient will demonstrate independent ambulation with evenly distributed weight into the RLE.  Baseline: Pt avoids putting weight into RLE, uses 2WW  Goal status: INITIAL  3.  Patient will improve his LEFS score to at least 61/80 to promote minimal disability to normal function in ADLs.  Baseline: 14/80=17.5% Goal status: INITIAL  4.  Patient will improve R knee flexion ROM to >/= 125  to promote and maintain patients ability to perform stair negotiation at home.  Baseline: R knee flx: 99 Goal status: INITIAL   PLAN:  PT FREQUENCY: 2x/week  PT DURATION: 8 weeks  PLANNED INTERVENTIONS: [id number]- PT Re-evaluation, 97750- Physical Performance Testing, 97110-Therapeutic exercises, 97530- Therapeutic activity, 97112- Neuromuscular re-education, 97535- Self Care, [id number]- Manual therapy, [phone]- Gait training, [phone]- Aquatic Therapy, [phone]- Electrical stimulation (unattended), [phone]- Electrical stimulation (manual), [id number]- Ultrasound, [id number]- Ionotophoresis 4mg /ml Dexamethasone, [id number] (1-2 muscles), 20561 (3+ muscles)- Dry Needling, Patient/Family education, Balance training, Stair training, Taping, Joint mobilization, Scar mobilization, DME instructions, Cryotherapy, and Moist heat  PLAN FOR NEXT SESSION: Continue ROM exercises to address ext/flx limitations; progress strengthening esp VMO in standing to promote improved proprioception  and balance; review/update HEP PRN  Elijah CHRISTELLA Hidden, PT 11/15/2023, 12:46 PM

## 2023-11-17 ENCOUNTER — Encounter: Admitting: Family Medicine

## 2023-11-17 ENCOUNTER — Ambulatory Visit (INDEPENDENT_AMBULATORY_CARE_PROVIDER_SITE_OTHER): Admitting: Orthopaedic Surgery

## 2023-11-17 ENCOUNTER — Encounter: Payer: Self-pay | Admitting: Physical Therapy

## 2023-11-17 ENCOUNTER — Ambulatory Visit: Admitting: Physical Therapy

## 2023-11-17 DIAGNOSIS — M25661 Stiffness of right knee, not elsewhere classified: Secondary | ICD-10-CM | POA: Diagnosis not present

## 2023-11-17 DIAGNOSIS — M25561 Pain in right knee: Secondary | ICD-10-CM | POA: Diagnosis not present

## 2023-11-17 DIAGNOSIS — D480 Neoplasm of uncertain behavior of bone and articular cartilage: Secondary | ICD-10-CM

## 2023-11-17 DIAGNOSIS — M6281 Muscle weakness (generalized): Secondary | ICD-10-CM | POA: Diagnosis not present

## 2023-11-17 DIAGNOSIS — R2689 Other abnormalities of gait and mobility: Secondary | ICD-10-CM

## 2023-11-17 DIAGNOSIS — M12261 Villonodular synovitis (pigmented), right knee: Secondary | ICD-10-CM

## 2023-11-17 DIAGNOSIS — G8929 Other chronic pain: Secondary | ICD-10-CM | POA: Diagnosis not present

## 2023-11-17 DIAGNOSIS — R600 Localized edema: Secondary | ICD-10-CM | POA: Diagnosis not present

## 2023-11-17 NOTE — Therapy (Signed)
 OUTPATIENT PHYSICAL THERAPY TREATMENT     Patient Name: Brendan Gregory MRN: [id number] DOB:Jul 06, 1980, 43 y.o., male Today's Date: 11/17/2023  END OF SESSION:  PT End of Session - 11/17/23 0845     Visit Number 4    Date for PT Re-Evaluation 12/26/23    Authorization Type [employer] COMM PPO    Authorization - Visit Number 10    Authorization - Number of Visits 35    PT Start Time [phone]    PT Stop Time 0929    PT Time Calculation (min) 43 min    Activity Tolerance Patient tolerated treatment well    Behavior During Therapy WFL for tasks assessed/performed           Past Medical History:  Diagnosis Date   Agatston coronary artery calcium  score less than 100    Ca score 1.  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   Pigmented villonodular synovitis of right knee 08/09/2023   Hypokalemia 03/14/2022   Acute cough 03/08/2022   Pansinusitis 03/08/2022   Weakness 03/08/2022   Panic attack 10/25/2021   Generalized anxiety disorder 10/25/2021   Solitary pulmonary nodule 05/04/2021   Agatston coronary artery calcium  score less than 100 01/25/2021   Morbid obesity (HCC) 11/24/2020   Obstructive sleep apnea on CPAP 05/16/2019   Essential hypertension 05/16/2019    PCP: Frann Mabel Mt, DO   REFERRING PROVIDER: Jerri Kay HERO, MD   REFERRING DIAG:  [phone] (ICD-10-CM) - Pigmented villonodular synovitis of right knee  D48.0 (ICD-10-CM) - Synovial chondromatosis   THERAPY DIAG:  Acute pain of right knee  Stiffness of right knee, not elsewhere classified  Other abnormalities of gait and mobility  Muscle weakness (generalized)  RATIONALE FOR EVALUATION AND TREATMENT: Rehabilitation  ONSET DATE: scope performed 10/12/2023  NEXT MD VISIT:  11/17/2023   SUBJECTIVE:   SUBJECTIVE STATEMENT: Pt says he is doing alright. He did not sleep well last night, pt rolled over onto this knee and it hurt very much. Pt says exercises at home are going good. Still feels that his skin/scar tissue is stretching with exercises, scar massage has been helping. Also had to run to catch his daughter in a busy parking lot this morning as well.   PERTINENT HISTORY: H/o synovial chondromatosis, GAD, HTN, OSA, morbid obesity  PAIN:  Are you having pain? Yes: NPRS scale: 4/10  Pain location: Anterior knee, patellar area  Pain description: shooting, grinding feeling Aggravating factors: walking, turning in bed (5-6/10), stairs, bending knee, standing up from chair, extending the knee Relieving factors: sitting, elevating it when agitated, icing post-op    PRECAUTIONS: None  RED FLAGS: None   WEIGHT BEARING RESTRICTIONS: No  FALLS:  Has patient fallen in last 6 months? 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  DIAGNOSTIC FINDINGS:  07/18/2023-MRI OF THE RIGHT KNEE WITHOUT CONTRAST  IMPRESSION: 1. Thickening of the medial patellar plica with high-grade fissuring at the medial patellar facet articular cartilage, which can be seen in the setting of medial patellar plica syndrome. 2. Large joint effusion with regions of villous like synovial proliferation within the suprapatellar, posterior,  and anterior joint space, which could reflect synovitis/reactive synovial hypertrophy versus possible lipoma arborescens.  PATIENT SURVEYS:  LEFS: 14 / 80 = 17.5 % (Severe functional limitations)    COGNITION: Overall cognitive status: Within functional limits for tasks assessed     SENSATION: WFL  EDEMA:  Minimal Swelling at superior knee cap  R knee slightly inc'd temp   MUSCLE LENGTH: (11/15/23) Hamstrings: mod tight R ITB: mod/severe R  Piriformis: WFL Hip flexors: mild/mod tight R Quads: mod tight R Heelcord: mild tight R   POSTURE: rounded shoulders, forward head, flexed trunk , and d/t 2WW being short pt was fwd flexed but, improved w/ walker height adjusted   PALPATION: Mild TTP: joint line, quad tendon- reports rod feeling but not super painful   LOWER EXTREMITY ROM:  Active ROM Right eval Left eval R 11/10/23 R 11/15/23  Knee flexion 99 134 119 125  Knee extension Supported ext: 8  Open: 18  Supportedext: 0 Open: 2 Supported ext: 5  Open: 12 Supported ext: 0  Open: 6    LOWER EXTREMITY MMT:  MMT Right eval Left eval  Hip flexion 4- 5  Hip extension 4+ 4+  Hip abduction 4-, p! At knee  5  Hip adduction 4+ 5  Hip internal rotation 4- 4+  Hip external rotation 4- 4+  Knee flexion 4- 5  Knee extension 3+; v p! 5  Ankle dorsiflexion 5 5  Ankle plantarflexion    Ankle inversion    Ankle eversion     (Blank rows = not tested)  LOWER EXTREMITY SPECIAL TESTS:  Performed R SLR: pt able to perform has pain with initial quad activation   FUNCTIONAL TESTS:  5 times sit to stand: 14.3 Noted: pt performs single leg STS using only LLE to avoid weight into RLE. Pt also used B UE support onto table to perform a few reps.   GAIT: Distance walked: clinic distance  Assistive device utilized: Environmental consultant - 2 wheeled Level of assistance: Complete Independence Comments: pt ambulated into clinic with 2WW, antalgic gait noted with inc'd Wbing on the LLE > RLE, dec'd stance  time pt is limbing to offload RLE as much as possible                                                                                                                                TREATMENT DATE:    11/17/23 THERAPEUTIC EXERCISE: To improve strength, endurance, ROM, and flexibility.  Demonstration, verbal and  tactile cues throughout for technique. Rec Bike L3x22min  Supine HS Stretch + DF with strap RLE 2x30  Prone quad stretch w/ stretch RLE 2x30- reports feels like skin is ripping Supine SLR w/ 2# RLE 2x10  Standing hip abd 2# x15 B (RLE with 2# only)  Standing hip ext 2# x15 B (RLE w/ 2# only)  Standing HS curls x10 B (RLE 2#)  Standing hip add x10 B (RLE 2#) w/ wall support   NEUROMUSCULAR RE-EDUCATION: To improve coordination and kinesthesia. Wall Mini Squat x10 Mini Squat + Ball Squeeze- pt reported p! So discontinued after 5 reps, pt displayed excessive fwd lean d/t putting weight into his toes instead of his heels   MANUAL THERAPY: To promote normalized muscle tension, improved flexibility, improved joint mobility, increased ROM, reduced pain, and reduced edema utilizing connective tissue massage, and scar mobilization Scar Tissue Massage to R portal scars- note inc'd scar tissue and TTP on R scar area lateral to knee cap    11/15/2023 THERAPEUTIC EXERCISE: To improve strength, endurance, ROM, and flexibility.  Demonstration, verbal and tactile cues throughout for technique.  Rec Bike - L2 x 6 min Hooklying R HS stretch + DF for calf stretch with strap 2 x 30 Supine R ITB stretch with strap 2 x 30 Prone quad/RF stretch with strap and towel roll under distal thigh 2 x 30 Mod thomas quad/hip flexor stretch 2 x 30  MANUAL THERAPY: To promote normalized muscle tension, improved flexibility, improved joint mobility, increased ROM, reduced pain, and reduced edema utilizing joint mobilization, connective tissue massage, and scar mobilization. Scar massage to R knee portal  incisions - increased TTP over lateral portal XFM to R patellar tendon R knee medial patellar glides  NEUROMUSCULAR RE-EDUCATION: To improve coordination and kinesthesia. B SAQ over 8 FR + ADD ball squeeze btw ankles to increase VMO activation 2 x 10   11/10/2023 THERAPEUTIC EXERCISE: To improve strength, endurance, and ROM.  Demonstration, verbal and tactile cues throughout for technique.  Rec Bike - L1 x 6 min Supine R quad set + SLR 2# x 10 Hooklying R SAQ 2# 2 x 10 Seated R GTB HS curl 2 x 10  NEUROMUSCULAR RE-EDUCATION: To improve balance, coordination, kinesthesia, and proprioception. Standing with UE support on wall ladder for balance:  Quad set + SLR 2# x 10 bil Alt hip ABD 2# x 10 Alt hip ext 2# x 10 Mini-squat x 6, then x 4 after seated rest break- cues for posterior weight shift, avoiding knees forward of toes R GTB TKE 2 x 10   10/31/2023 SELF CARE:  Reviewed eval findings and role of PT in addressing identified deficits as well as instruction in initial HEP (see below).  MODALITIES: Game Ready vasopneumatic compression post session to R knee x 10 min, low compression, 34 to reduce post-exercise pain and swelling/edema     PATIENT EDUCATION:  Education details: progress with PT and HEP update  Person educated: Patient Education method: Explanation, Demonstration, Verbal cues, Handouts, and MedBridgeGO app updated Education comprehension: verbalized understanding, returned demonstration, and verbal cues required   HOME EXERCISE PROGRAM: Access Code: [email or web address] URL: [email or web address] Date: 11/15/2023 Prepared by: Elijah Hidden  Exercises - Seated Heel Slide  - 1 x daily - 7 x weekly - 3 sets - 10 reps - 5s hold - Quad Setting and Stretching  - 1 x daily - 7 x weekly - 3 sets - 10 reps - Active Straight Leg Raise with Quad Set  -  1 x daily - 7 x weekly - 1 sets - 10 reps - Seated Hamstring Curl with Anchored Resistance  - 1 x daily - 3 x weekly -  2 sets - 10 reps - 3 sec hold - Standing Terminal Knee Extension with Resistance  - 1 x daily - 3 x weekly - 2 sets - 10 reps - 5 sec hold - Standing Hip Flexion with Resistance Loop  - 1 x daily - 3 x weekly - 2 sets - 10 reps - 3 sec hold - Hip Abduction with Resistance Loop  - 1 x daily - 3 x weekly - 2 sets - 10 reps - 3 sec hold - Hip Extension with Resistance Loop  - 1 x daily - 3 x weekly - 2 sets - 10 reps - 3 sec hold - Supine Hamstring Stretch with Strap  - 2-3 x daily - 7 x weekly - 3 reps - 30 sec hold - Supine Iliotibial Band Stretch with Strap  - 2-3 x daily - 7 x weekly - 3 reps - 30 sec hold - Prone Quad Stretch with Towel Roll and Strap  - 2-3 x daily - 7 x weekly - 3 reps - 30 sec hold - Anterior Knee Scar Massage  - 2-3 x daily - 7 x weekly - 1-2 minute hold - Short Arc Quad with Ball Squeeze  - 1 x daily - 7 x weekly - 2 sets - 10 reps - 3 sec hold   ASSESSMENT:  CLINICAL IMPRESSION: Josh reports that his R knee has been doing fairly well. He states that at times when bending his knee, it feels like the tissue beneath his skin on the R knee is stretching or tearing. Localizes this sensation to the R incision area at most times. Provided scar tissue massage to the R portal scar site and the patellar tendon. Noted inc'd scar tissue around the R incision site, palpable lump pt's wife noticed as well. Pt exhibits difficulty with lateral R knee muscles as there is still dec'd VMO activation seeming to be d/t pain and discomfort felt with activation. Continued LE and hip strengthening today, gently introducing SLS activities w/ UE support. He struggled with performing unsupported mini squats as he tends to place his weight more anteriorly into his toes causing a fwd lean and placing the knees forward of the toes increasing anterior knee pressure. When bending his knee, he states that the pain is at inferior and lateral R knee cap. [name] will benefit from skilled PT intervention to  address deficits in ROM, strength, and balance to improve his mobility and activity tolerance w/ dec'd pain interference.    EVAL: Khyri Hinzman is a 43 y/o M seen today for evaluation and treatment of R knee pain ~2 wks after knee scope was performed. Patient reports that since the procedure there is some swelling, which has improved over time. Pt states that he is having trouble with painful movement of the knee, RLE weakness, and balance issues that he attributes to feeling less stable requiring him to use his brother's 2WW to ambulate currently. Today's evaluation revealed deficits in knee AROM with significant losses in R knee flexion and extension limited by pain, RLE weakness (as shown in chart above), presence of mild tenderness and edema at the R knee. Upon observation, pt has an antalgic gait w/ dec'd swing phase ROM on the R, significantly dec'd WBing through the RLE. During pt's 5xSTS test today, he stated that  to perform a STS he has been completely using the LLE along to perform more of a single leg STS to avoid weight into the RLE. Bennie's pain in the R knee seems to contribute greatly to his current deficits and limitations in activities such as independent walking, squatting, sleeping (pt reported that his pain sometime interrupts his sleep). Jamorian scored a 14/80=17.5% which represents presence of severe functional limitations in activities such as standing, sitting, walking, squatting, and performing light housework. Pt's walker was also adjusted at the conclusion of the eval today as it was not appropriate height (too short) and pt was able to correct his fwd trunk flx afterwards. Pt used Gameready/ice for 10 mins after eval as knee was irritated, Josh will benefit from skilled PT to address his current deficits to improve his mobility, activity tolerance with decreased pain interference.   OBJECTIVE IMPAIRMENTS: Abnormal gait, decreased activity tolerance, decreased balance,  decreased coordination, decreased endurance, decreased knowledge of condition, decreased knowledge of use of DME, decreased mobility, difficulty walking, decreased ROM, decreased strength, increased edema, impaired perceived functional ability, impaired flexibility, improper body mechanics, postural dysfunction, and pain.   ACTIVITY LIMITATIONS: carrying, lifting, bending, sitting, standing, squatting, sleeping, stairs, transfers, bed mobility, locomotion level, and caring for others  PARTICIPATION LIMITATIONS: cleaning, laundry, driving, shopping, community activity, and occupation  PERSONAL FACTORS: Age, Past/current experiences, Profession, Time since onset of injury/illness/exacerbation, and 3+ comorbidities: synovial chondromatosis, GAD, HTN, OSA, morbid obesity are also affecting patient's functional outcome.   REHAB POTENTIAL: Good  CLINICAL DECISION MAKING: Evolving/moderate complexity  EVALUATION COMPLEXITY: Moderate   GOALS: Goals reviewed with patient? Yes  SHORT TERM GOALS: Target date: 11/28/2023    Patient will be independent with initial HEP provided.  Baseline: Goal status: MET - 11/15/23   2.  Patient will increase R knee flexion to 120 to improve patients function in walking, squatting, and stair climbing.  Baseline: R knee flx: 99 11/10/23 - R knee flexion AROM 119 Goal status: MET - 11/15/23 - R knee flexion AROM 125  3.  Patient will increase R knee extension to 0 to increase ROM, activity tolerance, and function.  Baseline: Supported extension: 8, Open Chain LAQ: 18 11/10/23 - R knee extension AROM 12 open chain, supported extension 5 Goal status: PARTIALLY MET - 11/15/23 - R knee extension AROM 6 open chain, supported extension to 0  4.  Patient will be able to ambulate clinic distances without 2WW for balance/stability.  Baseline: Pt is using 2WW for all ambulation  Goal status: MET - 11/10/23   LONG TERM GOALS: Target date: 11/28/2023    Patient will be  independent to advanced HEP.  Baseline:  Goal status: INITIAL  2.  Patient will demonstrate independent ambulation with evenly distributed weight into the RLE.  Baseline: Pt avoids putting weight into RLE, uses 2WW  Goal status: INITIAL  3.  Patient will improve his LEFS score to at least 61/80 to promote minimal disability to normal function in ADLs.  Baseline: 14/80=17.5% Goal status: INITIAL  4.  Patient will improve R knee flexion ROM to >/= 125  to promote and maintain patients ability to perform stair negotiation at home.  Baseline: R knee flx: 99 Goal status: INITIAL   PLAN:  PT FREQUENCY: 2x/week  PT DURATION: 8 weeks  PLANNED INTERVENTIONS: 97164- PT Re-evaluation, 97750- Physical Performance Testing, 97110-Therapeutic exercises, 97530- Therapeutic activity, [id number]- Neuromuscular re-education, 97535- Self Care, 02859- Manual therapy, [id number]- Gait training, [phone]- Aquatic Therapy, H9716- Electrical stimulation (unattended), [phone]-  Electrical stimulation (manual), 02964- Ultrasound, [id number]- Ionotophoresis 4mg /ml Dexamethasone, 20560 (1-2 muscles), 20561 (3+ muscles)- Dry Needling, Patient/Family education, Balance training, Stair training, Taping, Joint mobilization, Scar mobilization, DME instructions, Cryotherapy, and Moist heat  PLAN FOR NEXT SESSION: Continue ROM exercises to address ext/flx limitations; progress strengthening esp VMO in standing to promote improved proprioception and balance; SLS activities, light stepping exercises; review/update HEP PRN  Kassidee Narciso, Student-PT 11/17/2023, 10:07 AM

## 2023-11-17 NOTE — Progress Notes (Signed)
 Post-Op Visit Note   Patient: Brendan Gregory           Date of Birth: 10-Aug-1980           MRN: [id number] Visit Date: 11/17/2023 PCP: Frann Mabel Mt, DO   Assessment & Plan:  Chief Complaint:  Chief Complaint  Patient presents with   Right Knee - Follow-up    Right knee scope 10/12/2023   Visit Diagnoses:  1. Pigmented villonodular synovitis of right knee   2. Synovial chondromatosis     Plan: History of Present Illness Brendan Gregory is a 43 year old male with a history of pigmented villonodular synovitis (PVNS) who presents for follow-up after knee surgery.  He is walking without a walker and regaining balance. He experiences tightness and a sensation like 'skin ripping' during knee flexion, similar to 'pulling like a scab.' Physical therapy shows significant improvement, with an eight-degree increase in range of motion.  He notes a 'little pooch' at the knee with fullness and a tearing sensation in the front. In the pool, he feels the water 'squeezing and pushing' his knee back into place.  He cannot lay on his stomach or put pressure on the knee. His surgery was approximately five weeks ago, on July 10th.  He inquires about the possibility of developing PVNS in the other knee.  Exam of the right knee shows healed surgical scars.  Range of motion is excellent.  Assessment and Plan Postoperative pain and stiffness following right knee arthroscopy Five weeks post-arthroscopy with ongoing pain and stiffness. Likely due to inflamed anterior knee tissue. Eight-degree improvement in range of motion with physical therapy. Discomfort when prone expected to resolve. Discussed potential genetic predisposition to PBNS. - Continue physical therapy. - Perform knee massages. - No further follow-up unless issues arise.  Follow-Up Instructions: No follow-ups on file.   Orders:  No orders of the defined types were placed in this encounter.  No orders of the defined  types were placed in this encounter.   Imaging: No results found.  PMFS History: Patient Active Problem List   Diagnosis Date Noted   Synovial chondromatosis 10/20/2023   Pigmented villonodular synovitis of right knee 08/09/2023   Hypokalemia 03/14/2022   Acute cough 03/08/2022   Pansinusitis 03/08/2022   Weakness 03/08/2022   Panic attack 10/25/2021   Generalized anxiety disorder 10/25/2021   Solitary pulmonary nodule 05/04/2021   Agatston coronary artery calcium  score less than 100 01/25/2021   Morbid obesity (HCC) 11/24/2020   Obstructive sleep apnea on CPAP 05/16/2019   Essential hypertension 05/16/2019   Past Medical History:  Diagnosis Date   Agatston coronary artery calcium  score less than 100    Ca score 1.  No CAD on coronary CTA   Hypertension     Family History  Problem Relation Age of Onset   Healthy Mother    Prostate cancer Father 31   Diabetes Father    Hypertension Father    Diabetes Brother    Hypertension Brother    Diabetes Maternal Grandfather     Past Surgical History:  Procedure Laterality Date   MOLE REMOVAL     outpatient procedure   testicular     for undescenced testicle   Social History   Occupational History   Not on file  Tobacco Use   Smoking status: Never    Passive exposure: Never   Smokeless tobacco: Never  Vaping Use   Vaping status: Former   Substances: THC  Substance and  Sexual Activity   Alcohol use: Not Currently   Drug use: Not Currently    Frequency: 7.0 times per week    Types: Marijuana    Comment: daily use marijuana smoking.  Quit 2021.   Sexual activity: Not on file

## 2023-11-22 ENCOUNTER — Ambulatory Visit

## 2023-11-22 DIAGNOSIS — R600 Localized edema: Secondary | ICD-10-CM

## 2023-11-22 DIAGNOSIS — M25661 Stiffness of right knee, not elsewhere classified: Secondary | ICD-10-CM

## 2023-11-22 DIAGNOSIS — G8929 Other chronic pain: Secondary | ICD-10-CM

## 2023-11-22 DIAGNOSIS — R2689 Other abnormalities of gait and mobility: Secondary | ICD-10-CM | POA: Diagnosis not present

## 2023-11-22 DIAGNOSIS — M25561 Pain in right knee: Secondary | ICD-10-CM | POA: Diagnosis not present

## 2023-11-22 DIAGNOSIS — M6281 Muscle weakness (generalized): Secondary | ICD-10-CM

## 2023-11-22 NOTE — Therapy (Signed)
 OUTPATIENT PHYSICAL THERAPY TREATMENT     Patient Name: Brendan Gregory MRN: [id number] DOB:29-Jun-1980, 43 y.o., male Today's Date: 11/22/2023  END OF SESSION:  PT End of Session - 11/22/23 1150     Visit Number 5    Date for PT Re-Evaluation 12/26/23    Authorization Type [employer] COMM PPO    Authorization - Number of Visits 35    PT Start Time 1148    PT Stop Time 1230    PT Time Calculation (min) 42 min    Activity Tolerance Patient tolerated treatment well    Behavior During Therapy WFL for tasks assessed/performed           Past Medical History:  Diagnosis Date   Agatston coronary artery calcium  score less than 100    Ca score 1.  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   Pigmented villonodular synovitis of right knee 08/09/2023   Hypokalemia 03/14/2022   Acute cough 03/08/2022   Pansinusitis 03/08/2022   Weakness 03/08/2022   Panic attack 10/25/2021   Generalized anxiety disorder 10/25/2021   Solitary pulmonary nodule 05/04/2021   Agatston coronary artery calcium  score less than 100 01/25/2021   Morbid obesity (HCC) 11/24/2020   Obstructive sleep apnea on CPAP 05/16/2019   Essential hypertension 05/16/2019    PCP: Frann Mabel Mt, DO   REFERRING PROVIDER: Jerri Kay HERO, MD   REFERRING DIAG:  [phone] (ICD-10-CM) - Pigmented villonodular synovitis of right knee  D48.0 (ICD-10-CM) - Synovial chondromatosis   THERAPY DIAG:  Acute pain of right knee  Stiffness of right knee, not elsewhere classified  Other abnormalities of gait and mobility  Muscle weakness (generalized)  Chronic pain of right knee  Localized edema  RATIONALE FOR EVALUATION AND TREATMENT: Rehabilitation  ONSET DATE: scope performed 10/12/2023  NEXT  MD VISIT: 11/17/2023   SUBJECTIVE:   SUBJECTIVE STATEMENT: Pt says he is doing alright. He did not sleep well last night, pt rolled over onto this knee and it hurt very much. Pt says exercises at home are going good. Still feels that his skin/scar tissue is stretching with exercises, scar massage has been helping. Also had to run to catch his daughter in a busy parking lot this morning as well.   PERTINENT HISTORY: H/o synovial chondromatosis, GAD, HTN, OSA, morbid obesity  PAIN:  Are you having pain? Yes: NPRS scale: 4/10  Pain location: Anterior knee, patellar area  Pain description: shooting, grinding feeling Aggravating factors: walking, turning in bed (5-6/10), stairs, bending knee, standing up from chair, extending the knee Relieving factors: sitting, elevating it when agitated, icing post-op    PRECAUTIONS: None  RED FLAGS: None   WEIGHT BEARING RESTRICTIONS: No  FALLS:  Has patient fallen in last 6 months? 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  DIAGNOSTIC FINDINGS:  07/18/2023-MRI OF THE RIGHT KNEE WITHOUT CONTRAST  IMPRESSION: 1. Thickening of the medial patellar plica with high-grade fissuring at the medial patellar facet articular cartilage, which can be seen in the setting of medial patellar plica syndrome. 2. Large joint effusion with regions of villous like synovial proliferation within the suprapatellar,  posterior, and anterior joint space, which could reflect synovitis/reactive synovial hypertrophy versus possible lipoma arborescens.  PATIENT SURVEYS:  LEFS: 14 / 80 = 17.5 % (Severe functional limitations)    COGNITION: Overall cognitive status: Within functional limits for tasks assessed     SENSATION: WFL  EDEMA:  Minimal Swelling at superior knee cap  R knee slightly inc'd temp   MUSCLE LENGTH: (11/15/23) Hamstrings: mod tight R ITB: mod/severe R  Piriformis: WFL Hip flexors: mild/mod tight R Quads: mod tight R Heelcord: mild tight R   POSTURE: rounded shoulders, forward head, flexed trunk , and d/t 2WW being short pt was fwd flexed but, improved w/ walker height adjusted   PALPATION: Mild TTP: joint line, quad tendon- reports rod feeling but not super painful   LOWER EXTREMITY ROM:  Active ROM Right eval Left eval R 11/10/23 R 11/15/23 R 11/22/23   Knee flexion 99 134 119 125 125  Knee extension Supported ext: 8  Open: 18  Supportedext: 0 Open: 2 Supported ext: 5  Open: 12 Supported ext: 0  Open: 6 4    LOWER EXTREMITY MMT:  MMT Right eval Left eval  Hip flexion 4- 5  Hip extension 4+ 4+  Hip abduction 4-, p! At knee  5  Hip adduction 4+ 5  Hip internal rotation 4- 4+  Hip external rotation 4- 4+  Knee flexion 4- 5  Knee extension 3+; v p! 5  Ankle dorsiflexion 5 5  Ankle plantarflexion    Ankle inversion    Ankle eversion     (Blank rows = not tested)  LOWER EXTREMITY SPECIAL TESTS:  Performed R SLR: pt able to perform has pain with initial quad activation   FUNCTIONAL TESTS:  5 times sit to stand: 14.3 Noted: pt performs single leg STS using only LLE to avoid weight into RLE. Pt also used B UE support onto table to perform a few reps.   GAIT: Distance walked: clinic distance  Assistive device utilized: Environmental consultant - 2 wheeled Level of assistance: Complete Independence Comments: pt ambulated into clinic with 2WW, antalgic gait noted with inc'd Wbing on  the LLE > RLE, dec'd stance time pt is limbing to offload RLE as much as possible                                                                                                                                TREATMENT DATE:   11/22/23 THERAPEUTIC EXERCISE: To improve strength, endurance, ROM, and  flexibility.  Demonstration, verbal and tactile cues throughout for technique. Rec Bike L3x61min  Supine Hip ADD 10x5 Supine SAQ + ball squeeze 2x5 RLE Supine Thomas stretch R side x 30 to address cramp in hip flexor SLR + ER RLE 2x10 Sit to stand with ball squeeze x 10 LAQ+ ball squeeze x 10  MANUAL THERAPY: To promote normalized muscle tension, improved flexibility, improved joint mobility, increased ROM, reduced pain, and reduced edema utilizing connective tissue massage, and scar mobilization Scar Tissue Massage to R portal scars, STM to R hip ADD, pes anserine, ITB insertion  11/17/23 THERAPEUTIC EXERCISE: To improve strength, endurance, ROM, and flexibility.  Demonstration, verbal and tactile cues throughout for technique. Rec Bike L3x8min  Supine HS Stretch + DF with strap RLE 2x30  Prone quad stretch w/ stretch RLE 2x30- reports feels like skin is ripping Supine SLR w/ 2# RLE 2x10  Standing hip abd 2# x15 B (RLE with 2# only)  Standing hip ext 2# x15 B (RLE w/ 2# only)  Standing HS curls x10 B (RLE 2#)  Standing hip add x10 B (RLE 2#) w/ wall support   NEUROMUSCULAR RE-EDUCATION: To improve coordination and kinesthesia. Wall Mini Squat x10 Mini Squat + Ball Squeeze- pt reported p! So discontinued after 5 reps, pt displayed excessive fwd lean d/t putting weight into his toes instead of his heels   MANUAL THERAPY: To promote normalized muscle tension, improved flexibility, improved joint mobility, increased ROM, reduced pain, and reduced edema utilizing connective tissue massage, and scar mobilization Scar Tissue Massage to R portal scars- note inc'd scar tissue and TTP on R scar  area lateral to knee cap    11/15/2023 THERAPEUTIC EXERCISE: To improve strength, endurance, ROM, and flexibility.  Demonstration, verbal and tactile cues throughout for technique.  Rec Bike - L2 x 6 min Hooklying R HS stretch + DF for calf stretch with strap 2 x 30 Supine R ITB stretch with strap 2 x 30 Prone quad/RF stretch with strap and towel roll under distal thigh 2 x 30 Mod thomas quad/hip flexor stretch 2 x 30  MANUAL THERAPY: To promote normalized muscle tension, improved flexibility, improved joint mobility, increased ROM, reduced pain, and reduced edema utilizing joint mobilization, connective tissue massage, and scar mobilization. Scar massage to R knee portal incisions - increased TTP over lateral portal XFM to R patellar tendon R knee medial patellar glides  NEUROMUSCULAR RE-EDUCATION: To improve coordination and kinesthesia. B SAQ over 8 FR + ADD ball squeeze btw ankles to increase VMO activation 2 x 10   11/10/2023 THERAPEUTIC EXERCISE: To improve strength, endurance, and ROM.  Demonstration, verbal and tactile cues throughout for technique.  Rec Bike - L1 x 6 min Supine R quad set + SLR 2# x 10 Hooklying R SAQ 2# 2 x 10 Seated R GTB HS curl 2 x 10  NEUROMUSCULAR RE-EDUCATION: To improve balance, coordination, kinesthesia, and proprioception. Standing with UE support on wall ladder for balance:  Quad set + SLR 2# x 10 bil Alt hip ABD 2# x 10 Alt hip ext 2# x 10 Mini-squat x 6, then x 4 after seated rest break- cues for posterior weight shift, avoiding knees forward of toes R GTB TKE 2 x 10   10/31/2023 SELF CARE:  Reviewed eval findings and role of PT in addressing identified deficits as well as instruction in initial HEP (see below).  MODALITIES: Game Ready vasopneumatic compression post session to R knee x 10 min, low compression, 34 to reduce  post-exercise pain and swelling/edema     PATIENT EDUCATION:  Education details: progress with PT and HEP  update  Person educated: Patient Education method: Explanation, Demonstration, Verbal cues, Handouts, and MedBridgeGO app updated Education comprehension: verbalized understanding, returned demonstration, and verbal cues required   HOME EXERCISE PROGRAM: Access Code: [email or web address] URL: [email or web address] Date: 11/22/2023 Prepared by: Arfa Lamarca  Exercises - Seated Heel Slide  - 1 x daily - 7 x weekly - 3 sets - 10 reps - 5s hold - Quad Setting and Stretching  - 1 x daily - 7 x weekly - 3 sets - 10 reps - Active Straight Leg Raise with Quad Set  - 1 x daily - 7 x weekly - 1 sets - 10 reps - Seated Hamstring Curl with Anchored Resistance  - 1 x daily - 3 x weekly - 2 sets - 10 reps - 3 sec hold - Standing Terminal Knee Extension with Resistance  - 1 x daily - 3 x weekly - 2 sets - 10 reps - 5 sec hold - Standing Hip Flexion with Resistance Loop  - 1 x daily - 3 x weekly - 2 sets - 10 reps - 3 sec hold - Hip Abduction with Resistance Loop  - 1 x daily - 3 x weekly - 2 sets - 10 reps - 3 sec hold - Hip Extension with Resistance Loop  - 1 x daily - 3 x weekly - 2 sets - 10 reps - 3 sec hold - Supine Hamstring Stretch with Strap  - 2-3 x daily - 7 x weekly - 3 reps - 30 sec hold - Supine Iliotibial Band Stretch with Strap  - 2-3 x daily - 7 x weekly - 3 reps - 30 sec hold - Prone Quad Stretch with Towel Roll and Strap  - 2-3 x daily - 7 x weekly - 3 reps - 30 sec hold - Anterior Knee Scar Massage  - 2-3 x daily - 7 x weekly - 1-2 minute hold - Short Arc Quad with [employer]  - 1 x daily - 7 x weekly - 2 sets - 10 reps - 3 sec hold - Straight Leg Raise with External Rotation  - 1 x daily - 7 x weekly - 2 sets - 10 reps - Sit to Stand  - 1 x daily - 7 x weekly - 2 sets - 10 reps   ASSESSMENT:  CLINICAL IMPRESSION: Pt noted more pain today but noting increase in activity as well. Good response to MT, he did well with the exercises  but with seated LAQ + ball squeeze noted more  pain. He has pain with transitioning from knee flexion to extension with crepitus. Continued with VMO focused exercises. Weakness shown today. [name] will benefit from skilled PT intervention to address deficits in ROM, strength, and balance to improve his mobility and activity tolerance w/ dec'd pain interference.    EVAL: Alexius Hangartner is a 43 y/o M seen today for evaluation and treatment of R knee pain ~2 wks after knee scope was performed. Patient reports that since the procedure there is some swelling, which has improved over time. Pt states that he is having trouble with painful movement of the knee, RLE weakness, and balance issues that he attributes to feeling less stable requiring him to use his brother's 2WW to ambulate currently. Today's evaluation revealed deficits in knee AROM with significant losses in R knee flexion and extension limited by pain, RLE weakness (as shown in  chart above), presence of mild tenderness and edema at the R knee. Upon observation, pt has an antalgic gait w/ dec'd swing phase ROM on the R, significantly dec'd WBing through the RLE. During pt's 5xSTS test today, he stated that to perform a STS he has been completely using the LLE along to perform more of a single leg STS to avoid weight into the RLE. Link's pain in the R knee seems to contribute greatly to his current deficits and limitations in activities such as independent walking, squatting, sleeping (pt reported that his pain sometime interrupts his sleep). Danney scored a 14/80=17.5% which represents presence of severe functional limitations in activities such as standing, sitting, walking, squatting, and performing light housework. Pt's walker was also adjusted at the conclusion of the eval today as it was not appropriate height (too short) and pt was able to correct his fwd trunk flx afterwards. Pt used Gameready/ice for 10 mins after eval as knee was irritated, Josh will benefit from skilled PT to address his  current deficits to improve his mobility, activity tolerance with decreased pain interference.   OBJECTIVE IMPAIRMENTS: Abnormal gait, decreased activity tolerance, decreased balance, decreased coordination, decreased endurance, decreased knowledge of condition, decreased knowledge of use of DME, decreased mobility, difficulty walking, decreased ROM, decreased strength, increased edema, impaired perceived functional ability, impaired flexibility, improper body mechanics, postural dysfunction, and pain.   ACTIVITY LIMITATIONS: carrying, lifting, bending, sitting, standing, squatting, sleeping, stairs, transfers, bed mobility, locomotion level, and caring for others  PARTICIPATION LIMITATIONS: cleaning, laundry, driving, shopping, community activity, and occupation  PERSONAL FACTORS: Age, Past/current experiences, Profession, Time since onset of injury/illness/exacerbation, and 3+ comorbidities: synovial chondromatosis, GAD, HTN, OSA, morbid obesity are also affecting patient's functional outcome.   REHAB POTENTIAL: Good  CLINICAL DECISION MAKING: Evolving/moderate complexity  EVALUATION COMPLEXITY: Moderate   GOALS: Goals reviewed with patient? Yes  SHORT TERM GOALS: Target date: 11/28/2023    Patient will be independent with initial HEP provided.  Baseline: Goal status: MET - 11/15/23   2.  Patient will increase R knee flexion to 120 to improve patients function in walking, squatting, and stair climbing.  Baseline: R knee flx: 99 11/10/23 - R knee flexion AROM 119 Goal status: MET - 11/15/23 - R knee flexion AROM 125  3.  Patient will increase R knee extension to 0 to increase ROM, activity tolerance, and function.  Baseline: Supported extension: 8, Open Chain LAQ: 18 11/10/23 - R knee extension AROM 12 open chain, supported extension 5 Goal status: PARTIALLY MET - 11/15/23 - R knee extension AROM 6 open chain, supported extension to 0  4.  Patient will be able to ambulate clinic  distances without 2WW for balance/stability.  Baseline: Pt is using 2WW for all ambulation  Goal status: MET - 11/10/23   LONG TERM GOALS: Target date: 11/28/2023    Patient will be independent to advanced HEP.  Baseline:  Goal status: INITIAL  2.  Patient will demonstrate independent ambulation with evenly distributed weight into the RLE.  Baseline: Pt avoids putting weight into RLE, uses 2WW  Goal status: INITIAL  3.  Patient will improve his LEFS score to at least 61/80 to promote minimal disability to normal function in ADLs.  Baseline: 14/80=17.5% Goal status: INITIAL  4.  Patient will improve R knee flexion ROM to >/= 125  to promote and maintain patients ability to perform stair negotiation at home.  Baseline: R knee flx: 99 Goal status: INITIAL   PLAN:  PT FREQUENCY:  2x/week  PT DURATION: 8 weeks  PLANNED INTERVENTIONS: 97164- PT Re-evaluation, 97750- Physical Performance Testing, 97110-Therapeutic exercises, 97530- Therapeutic activity, 97112- Neuromuscular re-education, [phone]- Self Care, 97140- Manual therapy, [phone]- Gait training, [phone]- Aquatic Therapy, [phone]- Electrical stimulation (unattended), [phone]- Electrical stimulation (manual), [id number]- Ultrasound, [id number]- Ionotophoresis 4mg /ml Dexamethasone, 20560 (1-2 muscles), 20561 (3+ muscles)- Dry Needling, Patient/Family education, Balance training, Stair training, Taping, Joint mobilization, Scar mobilization, DME instructions, Cryotherapy, and Moist heat  PLAN FOR NEXT SESSION: Continue ROM exercises to address ext/flx limitations; progress strengthening esp VMO in standing to promote improved proprioception and balance; SLS activities, light stepping exercises; review/update HEP PRN  Sol LITTIE Gaskins, PTA 11/22/2023, 1:13 PM

## 2023-11-24 ENCOUNTER — Encounter

## 2023-11-29 ENCOUNTER — Ambulatory Visit (INDEPENDENT_AMBULATORY_CARE_PROVIDER_SITE_OTHER): Admitting: Family Medicine

## 2023-11-29 ENCOUNTER — Ambulatory Visit: Admitting: Physical Therapy

## 2023-11-29 ENCOUNTER — Ambulatory Visit: Payer: Self-pay | Admitting: Family Medicine

## 2023-11-29 ENCOUNTER — Encounter: Payer: Self-pay | Admitting: Physical Therapy

## 2023-11-29 ENCOUNTER — Encounter: Payer: Self-pay | Admitting: Family Medicine

## 2023-11-29 VITALS — BP 148/92 | HR 100 | Temp 98.0°F | Resp 16 | Ht 71.0 in | Wt 308.0 lb

## 2023-11-29 DIAGNOSIS — M6281 Muscle weakness (generalized): Secondary | ICD-10-CM

## 2023-11-29 DIAGNOSIS — Z0000 Encounter for general adult medical examination without abnormal findings: Secondary | ICD-10-CM | POA: Diagnosis not present

## 2023-11-29 DIAGNOSIS — M25661 Stiffness of right knee, not elsewhere classified: Secondary | ICD-10-CM

## 2023-11-29 DIAGNOSIS — I10 Essential (primary) hypertension: Secondary | ICD-10-CM

## 2023-11-29 DIAGNOSIS — Z13220 Encounter for screening for lipoid disorders: Secondary | ICD-10-CM

## 2023-11-29 DIAGNOSIS — R2689 Other abnormalities of gait and mobility: Secondary | ICD-10-CM

## 2023-11-29 DIAGNOSIS — M25561 Pain in right knee: Secondary | ICD-10-CM | POA: Diagnosis not present

## 2023-11-29 DIAGNOSIS — R600 Localized edema: Secondary | ICD-10-CM | POA: Diagnosis not present

## 2023-11-29 DIAGNOSIS — Z125 Encounter for screening for malignant neoplasm of prostate: Secondary | ICD-10-CM

## 2023-11-29 DIAGNOSIS — G8929 Other chronic pain: Secondary | ICD-10-CM | POA: Diagnosis not present

## 2023-11-29 LAB — CBC
HCT: 43.8 % (ref 39.0–52.0)
Hemoglobin: 14.7 g/dL (ref 13.0–17.0)
MCHC: 33.6 g/dL (ref 30.0–36.0)
MCV: 89.3 fl (ref 78.0–100.0)
Platelets: 289 K/uL (ref 150.0–400.0)
RBC: 4.9 Mil/uL (ref 4.22–5.81)
RDW: 13.2 % (ref 11.5–15.5)
WBC: 8.2 K/uL (ref 4.0–10.5)

## 2023-11-29 LAB — LIPID PANEL
Cholesterol: 129 mg/dL (ref 0–200)
HDL: 44.6 mg/dL (ref >39.00)
LDL Cholesterol: 65 mg/dL (ref 0–99)
NonHDL: 84.67
Total CHOL/HDL Ratio: 3
Triglycerides: 100 mg/dL (ref 0.0–149.0)
VLDL: 20 mg/dL (ref 0.0–40.0)

## 2023-11-29 LAB — COMPREHENSIVE METABOLIC PANEL WITH GFR
ALT: 48 U/L (ref 0–53)
AST: 27 U/L (ref 0–37)
Albumin: 4.5 g/dL (ref 3.5–5.2)
Alkaline Phosphatase: 103 U/L (ref 39–117)
BUN: 17 mg/dL (ref 6–23)
CO2: 28 meq/L (ref 19–32)
Calcium: 9.1 mg/dL (ref 8.4–10.5)
Chloride: 101 meq/L (ref 96–112)
Creatinine, Ser: 0.88 mg/dL (ref 0.40–1.50)
GFR: 106.56 mL/min (ref >60.00)
Glucose, Bld: 82 mg/dL (ref 70–99)
Potassium: 4 meq/L (ref 3.5–5.1)
Sodium: 138 meq/L (ref 135–145)
Total Bilirubin: 0.6 mg/dL (ref 0.2–1.2)
Total Protein: 7.5 g/dL (ref 6.0–8.3)

## 2023-11-29 LAB — PSA: PSA: 0.29 ng/mL (ref 0.10–4.00)

## 2023-11-29 MED ORDER — METOPROLOL SUCCINATE ER 50 MG PO TB24: 50.0000 mg | ORAL_TABLET | Freq: Every day | ORAL | 3 refills | Status: DC

## 2023-11-29 NOTE — Therapy (Addendum)
 OUTPATIENT PHYSICAL THERAPY TREATMENT / DISCHARGE SUMMARY     Patient Name: Brendan Gregory MRN: [id number] DOB:1981/03/22, 43 y.o., male Today's Date: 11/29/2023  END OF SESSION:  PT End of Session - 11/29/23 1146     Visit Number 6    Date for PT Re-Evaluation 12/26/23    Authorization Type [employer]BS COMM PPO    Authorization - Visit Number 12    Authorization - Number of Visits 35    PT Start Time 1146    PT Stop Time 1234    PT Time Calculation (min) 48 min    Activity Tolerance Patient tolerated treatment well    Behavior During Therapy WFL for tasks assessed/performed            Past Medical History:  Diagnosis Date   Agatston coronary artery calcium  score less than 100    Ca score 1.  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   Pigmented villonodular synovitis of right knee 08/09/2023   Hypokalemia 03/14/2022   Acute cough 03/08/2022   Pansinusitis 03/08/2022   Weakness 03/08/2022   Panic attack 10/25/2021   Generalized anxiety disorder 10/25/2021   Solitary pulmonary nodule 05/04/2021   Agatston coronary artery calcium  score less than 100 01/25/2021   Morbid obesity (HCC) 11/24/2020   Obstructive sleep apnea on CPAP 05/16/2019   Essential hypertension 05/16/2019    PCP: Frann Mabel Mt, DO   REFERRING PROVIDER: Jerri Kay HERO, MD   REFERRING DIAG:  [phone] (ICD-10-CM) - Pigmented villonodular synovitis of right knee  D48.0 (ICD-10-CM) - Synovial chondromatosis   THERAPY DIAG:  Acute pain of right knee  Stiffness of right knee, not elsewhere classified  Other abnormalities of gait and mobility  Muscle weakness (generalized)  RATIONALE FOR EVALUATION AND TREATMENT: Rehabilitation  ONSET DATE: scope performed  10/12/2023  NEXT MD VISIT: None scheduled   SUBJECTIVE:   SUBJECTIVE STATEMENT: Pt says he is doing alright. Sometimes his knee feels loose - not in a good way (lateral rock in knee joint).  No pain today.  PERTINENT HISTORY: H/o synovial chondromatosis, GAD, HTN, OSA, morbid obesity  PAIN:  Are you having pain? No and Yes: NPRS scale: 0/10  Pain location: Anterior knee, patellar area  Pain description: shooting, grinding feeling  Aggravating factors: bending knee, squats  Relieving factors: sitting, elevating it when agitated, icing    PRECAUTIONS: None  RED FLAGS: None   WEIGHT BEARING RESTRICTIONS: No  FALLS:  Has patient fallen in last 6 months? 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  DIAGNOSTIC FINDINGS:  07/18/2023-MRI OF THE RIGHT KNEE WITHOUT CONTRAST  IMPRESSION: 1. Thickening of the medial patellar plica with high-grade fissuring  at the medial patellar facet articular cartilage, which can be seen in the setting of medial patellar plica syndrome. 2. Large joint effusion with regions of villous like synovial proliferation within the suprapatellar, posterior, and anterior joint space, which could reflect synovitis/reactive synovial hypertrophy versus possible lipoma arborescens.  PATIENT SURVEYS:  LEFS: 14 / 80 = 17.5 % (Severe functional limitations)    COGNITION: Overall cognitive status: Within  functional limits for tasks assessed     SENSATION: WFL  EDEMA:  Minimal Swelling at superior knee cap  R knee slightly inc'd temp   MUSCLE LENGTH: (11/15/23) Hamstrings: mod tight R ITB: mod/severe R  Piriformis: WFL Hip flexors: mild/mod tight R Quads: mod tight R Heelcord: mild tight R   POSTURE: rounded shoulders, forward head, flexed trunk , and d/t 2WW being short pt was fwd flexed but, improved w/ walker height adjusted   PALPATION: Mild TTP: joint line, quad tendon- reports rod feeling but not super painful   LOWER EXTREMITY ROM:  Active ROM Right eval Left eval R 11/10/23 R 11/15/23 R 11/22/23  R 11/29/23  Knee flexion 99 134 119 125 125 133 p!  Knee extension Supported ext: 8  Open: 18  Supportedext: 0 Open: 2 Supported ext: 5  Open: 12 Supported ext: 0  Open: 6 4 Supported ext: 0  Open: 3    LOWER EXTREMITY MMT:  MMT Right eval Left eval R 11/29/23 L 11/29/23  Hip flexion 4- 5 4+ 5  Hip extension 4+ 4+ 4+ 5  Hip abduction 4-, p! At knee  5 4 5   Hip adduction 4+ 5 4+ 5  Hip internal rotation 4- 4+ 4+ 5  Hip external rotation 4- 4+ 4+ 5  Knee flexion 4- 5 4+ 5  Knee extension 3+; v p! 5 4+ (p! 2/10 at patella) 5  Ankle dorsiflexion 5 5    Ankle plantarflexion      Ankle inversion      Ankle eversion       (Blank rows = not tested)  LOWER EXTREMITY SPECIAL TESTS:  Performed R SLR: pt able to perform has pain with initial quad activation   FUNCTIONAL TESTS:  5 times sit to stand: 14.3 Noted: pt performs single leg STS using only LLE to avoid weight into RLE. Pt also used B UE support onto table to perform a few reps.   GAIT: Distance walked: clinic distance  Assistive device utilized: Environmental Consultant - 2 wheeled Level of assistance: Complete Independence Comments: pt ambulated into clinic with 2WW, antalgic gait noted with inc'd Wbing on the LLE > RLE, dec'd stance time pt is limbing to offload RLE as much as possible                                                                                                                                 TREATMENT DATE:    11/29/23  THERAPEUTIC EXERCISE: To improve strength, endurance, ROM, and  flexibility.  Demonstration, verbal and tactile cues throughout for technique.  Elliptical - L1.0 x 6 min Standing R lateral lean ITB stretch with counter support - pt reporting he feels unbalanced Supine R cross-body ITB stretch with strap 2 x 30  MANUAL THERAPY: To promote improved flexibility, improved joint mobility, pain modulation, and reduced pain utilizing joint mobilization.  R knee patellar mobs - inf/sup/medial - instructions provided for home performance  THERAPEUTIC ACTIVITIES: To improve functional performance.  Demonstration, verbal and tactile cues throughout for technique. LE MMT R knee ROM assessment - better ROM with less pain following patella mobs Supported/counter squat 2 x 10 - cues to avoid knees fwd of toes   11/22/23 THERAPEUTIC EXERCISE: To improve strength, endurance, ROM, and flexibility.  Demonstration, verbal and tactile cues throughout for technique. Rec Bike L3x69min  Supine Hip ADD 10x5 Supine SAQ + ball squeeze 2x5 RLE Supine Thomas stretch R side x 30 to address cramp in hip flexor SLR + ER RLE 2x10 Sit to stand with ball squeeze x 10 LAQ+ ball squeeze x 10  MANUAL THERAPY: To promote normalized muscle tension, improved flexibility, improved joint mobility, increased ROM, reduced pain, and reduced edema utilizing connective tissue massage, and scar mobilization Scar Tissue Massage to R portal scars, STM to R hip ADD, pes anserine, ITB insertion   11/17/23 THERAPEUTIC EXERCISE: To improve strength, endurance, ROM, and flexibility.  Demonstration, verbal and tactile cues throughout for technique. Rec Bike L3x73min  Supine HS Stretch + DF with strap RLE 2x30  Prone quad stretch w/ stretch RLE 2x30- reports feels like skin is ripping Supine SLR w/ 2# RLE 2x10  Standing hip abd 2#  x15 B (RLE with 2# only)  Standing hip ext 2# x15 B (RLE w/ 2# only)  Standing HS curls x10 B (RLE 2#)  Standing hip add x10 B (RLE 2#) w/ wall support   NEUROMUSCULAR RE-EDUCATION: To improve coordination and kinesthesia. Wall Mini Squat x10 Mini Squat + Ball Squeeze- pt reported p! So discontinued after 5 reps, pt displayed excessive fwd lean d/t putting weight into his toes instead of his heels   MANUAL THERAPY: To promote normalized muscle tension, improved flexibility, improved joint mobility, increased ROM, reduced pain, and reduced edema utilizing connective tissue massage, and scar mobilization Scar Tissue Massage to R portal scars- note inc'd scar tissue and TTP on R scar area lateral to knee cap    11/15/2023 THERAPEUTIC EXERCISE: To improve strength, endurance, ROM, and flexibility.  Demonstration, verbal and tactile cues throughout for technique.  Rec Bike - L2 x 6 min Hooklying R HS stretch + DF for calf stretch with strap 2 x 30 Supine R ITB stretch with strap 2 x 30 Prone quad/RF stretch with strap and towel roll under distal thigh 2 x 30 Mod thomas quad/hip flexor stretch 2 x 30  MANUAL THERAPY: To promote normalized muscle tension, improved flexibility, improved joint mobility, increased ROM, reduced pain, and reduced edema utilizing joint mobilization, connective tissue massage, and scar mobilization. Scar massage to R knee portal incisions - increased TTP over lateral portal XFM to R patellar tendon R knee medial patellar glides  NEUROMUSCULAR RE-EDUCATION: To improve coordination and kinesthesia. B SAQ over 8 FR + ADD ball squeeze btw ankles to increase VMO activation 2 x 10   11/10/2023 THERAPEUTIC EXERCISE: To improve strength, endurance, and ROM.  Demonstration, verbal and tactile cues throughout for technique.  Rec Bike - L1 x 6 min Supine R quad set + SLR  2# x 10 Hooklying R SAQ 2# 2 x 10 Seated R GTB HS curl 2 x 10  NEUROMUSCULAR RE-EDUCATION: To  improve balance, coordination, kinesthesia, and proprioception. Standing with UE support on wall ladder for balance:  Quad set + SLR 2# x 10 bil Alt hip ABD 2# x 10 Alt hip ext 2# x 10 Mini-squat x 6, then x 4 after seated rest break- cues for posterior weight shift, avoiding knees forward of toes R GTB TKE 2 x 10   10/31/2023 SELF CARE:  Reviewed eval findings and role of PT in addressing identified deficits as well as instruction in initial HEP (see below).  MODALITIES: Game Ready vasopneumatic compression post session to R knee x 10 min, low compression, 34 to reduce post-exercise pain and swelling/edema     PATIENT EDUCATION:  Education details: HEP update  Person educated: Patient Education method: Explanation, Demonstration, Verbal cues, Handouts, and [email or web address] app updated Education comprehension: verbalized understanding, returned demonstration, and verbal cues required   HOME EXERCISE PROGRAM: Access Code: [email or web address] URL: [email or web address] Date: 11/29/2023 Prepared by: Elijah Hidden  Exercises - Seated Heel Slide  - 1 x daily - 7 x weekly - 3 sets - 10 reps - 5s hold - Quad Setting and Stretching  - 1 x daily - 7 x weekly - 3 sets - 10 reps - Active Straight Leg Raise with Quad Set  - 1 x daily - 7 x weekly - 1 sets - 10 reps - Seated Hamstring Curl with Anchored Resistance  - 1 x daily - 3 x weekly - 2 sets - 10 reps - 3 sec hold - Standing Terminal Knee Extension with Resistance  - 1 x daily - 3 x weekly - 2 sets - 10 reps - 5 sec hold - Standing Hip Flexion with Resistance Loop  - 1 x daily - 3 x weekly - 2 sets - 10 reps - 3 sec hold - Hip Abduction with Resistance Loop  - 1 x daily - 3 x weekly - 2 sets - 10 reps - 3 sec hold - Hip Extension with Resistance Loop  - 1 x daily - 3 x weekly - 2 sets - 10 reps - 3 sec hold - Supine Hamstring Stretch with Strap  - 2-3 x daily - 7 x weekly - 3 reps - 30 sec hold - Supine Iliotibial Band Stretch with Strap   - 2-3 x daily - 7 x weekly - 3 reps - 30 sec hold - Prone Quad Stretch with Towel Roll and Strap  - 2-3 x daily - 7 x weekly - 3 reps - 30 sec hold - Anterior Knee Scar Massage  - 2-3 x daily - 7 x weekly - 1-2 minute hold - Short Arc Quad with [employer]  - 1 x daily - 7 x weekly - 2 sets - 10 reps - 3 sec hold - Straight Leg Raise with External Rotation  - 1 x daily - 7 x weekly - 2 sets - 10 reps - Sit to Stand  - 1 x daily - 7 x weekly - 2 sets - 10 reps - Long Sitting Inferior Patellar Glide  - 1 x daily - 7 x weekly - 2 sets - 10 reps - Long Sitting Medial Patellar Glide  - 1 x daily - 7 x weekly - 2 sets - 10 reps - Long Sitting Superior Patellar Glide  - 1 x daily - 7 x weekly - 2 sets - 10 reps -  Squat with Counter Support  - 1 x daily - 7 x weekly - 2 sets - 10 reps - 3 sec hold - Supine Iliotibial Band Stretch with Strap  - 2 x daily - 7 x weekly - 3 reps - 30 sec hold   ASSESSMENT:  CLINICAL IMPRESSION: Caige Almeda continues to note feeling of medial/lateral instability in his R knee, with locking sensation during squats and anterior knee pain with end ROM flexion even in supine.  Limited R patellar mobility noted with possible lateral tracking (difficult to assess through pt's jeans), therefore worked on patellar glides/mobilization and provided instruction for home performance.  Pt able to demonstrate increased R knee flexion ROM with less pain following patellar mobs.  Pt continues to be focused on improving tolerance for squatting and has been focusing on wall squats at home so introduced counter supported squats emphasizing sitting back during squat while avoiding knees forward of toes.  Comings is progressing with his PT goals and will benefit from continued skilled PT to address ongoing ROM, flexibility and strength deficits to improve mobility and activity tolerance with decreased pain interference.   EVAL: Karell Tukes is a 43 y/o M seen today for evaluation and  treatment of R knee pain ~2 wks after knee scope was performed. Patient reports that since the procedure there is some swelling, which has improved over time. Pt states that he is having trouble with painful movement of the knee, RLE weakness, and balance issues that he attributes to feeling less stable requiring him to use his brother's 2WW to ambulate currently. Today's evaluation revealed deficits in knee AROM with significant losses in R knee flexion and extension limited by pain, RLE weakness (as shown in chart above), presence of mild tenderness and edema at the R knee. Upon observation, pt has an antalgic gait w/ dec'd swing phase ROM on the R, significantly dec'd WBing through the RLE. During pt's 5xSTS test today, he stated that to perform a STS he has been completely using the LLE along to perform more of a single leg STS to avoid weight into the RLE. Darrio's pain in the R knee seems to contribute greatly to his current deficits and limitations in activities such as independent walking, squatting, sleeping (pt reported that his pain sometime interrupts his sleep). Arish scored a 14/80=17.5% which represents presence of severe functional limitations in activities such as standing, sitting, walking, squatting, and performing light housework. Pt's walker was also adjusted at the conclusion of the eval today as it was not appropriate height (too short) and pt was able to correct his fwd trunk flx afterwards. Pt used Gameready/ice for 10 mins after eval as knee was irritated, Josh will benefit from skilled PT to address his current deficits to improve his mobility, activity tolerance with decreased pain interference.   OBJECTIVE IMPAIRMENTS: Abnormal gait, decreased activity tolerance, decreased balance, decreased coordination, decreased endurance, decreased knowledge of condition, decreased knowledge of use of DME, decreased mobility, difficulty walking, decreased ROM, decreased strength, increased edema,  impaired perceived functional ability, impaired flexibility, improper body mechanics, postural dysfunction, and pain.   ACTIVITY LIMITATIONS: carrying, lifting, bending, sitting, standing, squatting, sleeping, stairs, transfers, bed mobility, locomotion level, and caring for others  PARTICIPATION LIMITATIONS: cleaning, laundry, driving, shopping, community activity, and occupation  PERSONAL FACTORS: Age, Past/current experiences, Profession, Time since onset of injury/illness/exacerbation, and 3+ comorbidities: synovial chondromatosis, GAD, HTN, OSA, morbid obesity are also affecting patient's functional outcome.   REHAB POTENTIAL: Good  CLINICAL DECISION  MAKING: Evolving/moderate complexity  EVALUATION COMPLEXITY: Moderate   GOALS: Goals reviewed with patient? Yes  SHORT TERM GOALS: Target date: 11/28/2023    Patient will be independent with initial HEP provided.  Baseline: Goal status: MET - 11/15/23   2.  Patient will increase R knee flexion to 120 to improve patients function in walking, squatting, and stair climbing.  Baseline: R knee flx: 99 11/10/23 - R knee flexion AROM 119 Goal status: MET - 11/15/23 - R knee flexion AROM 125  3.  Patient will increase R knee extension to 0 to increase ROM, activity tolerance, and function.  Baseline: Supported extension: 8, Open Chain LAQ: 18 11/10/23 - R knee extension AROM 12 open chain, supported extension 5 11/15/23 - R knee extension AROM 6 open chain, supported extension to 0 11/22/23 - R knee extension AROM 4 open chain Goal status: PARTIALLY MET - 11/29/23 - R knee extension AROM 3 open chain, supported extension to 0   4.  Patient will be able to ambulate clinic distances without 2WW for balance/stability.  Baseline: Pt is using 2WW for all ambulation  Goal status: MET - 11/10/23   LONG TERM GOALS: Target date: 11/28/2023    Patient will be independent to advanced HEP.  Baseline:  Goal status: IN PROGRESS - 11/29/23 - HEP  updated today  2.  Patient will demonstrate independent ambulation with evenly distributed weight into the RLE.  Baseline: Pt avoids putting weight into RLE, uses 2WW  Goal status: INITIAL  3.  Patient will improve his LEFS score to at least 61/80 to promote minimal disability to normal function in ADLs.  Baseline: 14/80=17.5% Goal status: INITIAL  4.  Patient will improve R knee flexion ROM to >/= 125  to promote and maintain patients ability to perform stair negotiation at home.  Baseline: R knee flx: 99 Goal status: MET - 11/29/23 -  R knee flexion AROM 133   PLAN:  PT FREQUENCY: 2x/week  PT DURATION: 8 weeks  PLANNED INTERVENTIONS: 02835- PT Re-evaluation, 97750- Physical Performance Testing, 97110-Therapeutic exercises, 97530- Therapeutic activity, 97112- Neuromuscular re-education, 97535- Self Care, 02859- Manual therapy, [phone]- Gait training, [phone]- Aquatic Therapy, [phone]- Electrical stimulation (unattended), [phone]- Electrical stimulation (manual), [id number]- Ultrasound, [id number]- Ionotophoresis 4mg /ml Dexamethasone, 79439 (1-2 muscles), [id number] (3+ muscles)- Dry Needling, Patient/Family education, Balance training, Stair training, Taping, Joint mobilization, Scar mobilization, DME instructions, Cryotherapy, and Moist heat  PLAN FOR NEXT SESSION: Continue MT and ROM exercises to address ext/flx limitations; progress strengthening esp VMO in standing to promote improved proprioception and balance; SLS activities, light stepping exercises; review/update HEP PRN  Elijah CHRISTELLA Hidden, PT 11/29/2023, 1:06 PM    PHYSICAL THERAPY DISCHARGE SUMMARY  Visits from Start of Care: 6  Current functional level related to goals / functional outcomes: Refer to above clinical impression and goal assessment for status as of last visit on 11/29/2023. Patient cancelled his last scheduled visit and failed to schedule any more visits.  He has not returned in >30 days, therefore will proceed with discharge from PT  for this episode.     Remaining deficits: As above. Unable to formally assess status at discharge due to failure to return to PT.    Education / Equipment: HEP   Patient agrees to discharge. Patient goals were partially met. Patient is being discharged due to not returning since the last visit.   Elijah EMERSON Hidden, PT 03/12/2024, 3:22 PM  [facility] [address]  [phone]  [place], [place], [address] Phone: [phone]   Fax:  [phone]

## 2023-11-29 NOTE — Progress Notes (Addendum)
 Chief Complaint  Patient presents with   Annual Exam    CPE    Well Male Brendan Gregory is here for a complete physical.   His last physical was >1 year ago.  Current diet: in general, diet could be better.    Current exercise: active in pool Weight trend: increased Fatigue out of ordinary? No. Seat belt? Yes.   Advanced directive? No  Health maintenance Tetanus- Yes HIV- Yes Hep C- Yes  Hypertension Patient presents for hypertension follow up. He does not monitor home blood pressures. Gets HTN headaches at 140-150's SBP, has been having that over the past mo.  He is compliant with medication- losartan  100 mg/d. Patient has these side effects of medication: none Diet/exercise as above. No CP or SOB.  Past Medical History:  Diagnosis Date   Agatston coronary artery calcium  score less than 100    Ca score 1.  No CAD on coronary CTA   Hypertension      Past Surgical History:  Procedure Laterality Date   MOLE REMOVAL     outpatient procedure   testicular     for undescenced testicle    Medications  Current Outpatient Medications on File Prior to Visit  Medication Sig Dispense Refill   atorvastatin  (LIPITOR) 10 MG tablet TAKE 1 TABLET BY MOUTH EVERY DAY 90 tablet 3   Garlic (GARLIQUE) 400 MG TBEC Take by mouth.     losartan  (COZAAR ) 100 MG tablet Take 1 tablet (100 mg total) by mouth daily. 90 tablet 0   OVER THE COUNTER MEDICATION Take 1 capsule by mouth daily at 6 (six) AM. Aged beet root      Allergies Allergies  Allergen Reactions   Amlodipine  Nausea Only    HA and nausea    Family History Family History  Problem Relation Age of Onset   Healthy Mother    Prostate cancer Father 78   Diabetes Father    Hypertension Father    Diabetes Brother    Hypertension Brother    Diabetes Maternal Grandfather     Review of Systems: Constitutional: no fevers or chills Eye:  no recent significant change in vision Ear/Nose/Mouth/Throat:  Ears:  no hearing  loss Nose/Mouth/Throat:  no complaints of nasal congestion, no sore throat Cardiovascular:  no chest pain Respiratory:  no shortness of breath Gastrointestinal:  no abdominal pain, no change in bowel habits GU:  Male: negative for dysuria, frequency, and incontinence Musculoskeletal/Extremities:  no new pain of the joints Integumentary (Skin/Breast):  no abnormal skin lesions reported Neurologic:  + headaches Endocrine: No unexpected weight changes Hematologic/Lymphatic:  no night sweats  Exam BP (!) 145/86 (BP Location: Left Arm, Patient Position: Sitting)   Pulse 100   Temp 98 F (36.7 C) (Oral)   Resp 16   Ht 5' 11 (1.803 m)   Wt (!) 308 lb (139.7 kg)   SpO2 96%   BMI 42.96 kg/m  General:  well developed, well nourished, in no apparent distress Skin:  no significant moles, warts, or growths Head:  no masses, lesions, or tenderness Eyes:  pupils equal and round, sclera anicteric without injection Ears:  canals without lesions, TMs shiny without retraction, no obvious effusion, no erythema Nose:  nares patent, mucosa normal Throat/Pharynx:  lips and gingiva without lesion; tongue and uvula midline; non-inflamed pharynx; no exudates or postnasal drainage Neck: neck supple without adenopathy, thyromegaly, or masses Lungs:  clear to auscultation, breath sounds equal bilaterally, no respiratory distress Cardio:  regular rate and  rhythm, no bruits, no LE edema Abdomen:  abdomen soft, nontender; bowel sounds normal; no masses or organomegaly Rectal: Deferred Musculoskeletal:  symmetrical muscle groups noted without atrophy or deformity Extremities:  no clubbing, cyanosis, or edema, no deformities, no skin discoloration Neuro:  gait normal; deep tendon reflexes normal and symmetric Psych: well oriented with normal range of affect and appropriate judgment/insight  Assessment and Plan  Well adult exam - Plan: CBC, Comprehensive metabolic panel with GFR, Lipid panel  Screening for  prostate cancer - Plan: PSA  Essential hypertension - Plan: metoprolol  succinate (TOPROL -XL) 50 MG 24 hr tablet   Well 43 y.o. male. Counseled on diet and exercise. Counseled on risks and benefits of prostate cancer screening with PSA. The patient agrees to undergo screening.  HTN: Chronic, not controlled. 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 Improve diet. Monitor BP at home. Cont losartan  100 mg/d. Add Toprol  XL 50 mg/d for a few mo.  Other orders as above. Follow up in 6 mo pending the above workup. The patient voiced understanding and agreement to the plan.  Brendan [place], DO 11/29/23 10:09 AM

## 2023-11-29 NOTE — Patient Instructions (Signed)
 Keep the diet clean and stay active.  Give us  2-3 business days to get the results of your labs back.   Check your blood pressures 2-3 times per week, alternating the time of day you check it. If it is high, considering waiting 1-2 minutes and rechecking. If it gets higher, your anxiety is likely creeping up and we should avoid rechecking.   As it comes down, see how things go off of the metoprolol  and let me know if it does or does not come down.   I recommend getting the flu shot in mid October. This suggestion would change if the CDC comes out with a different recommendation.   Please get me a copy of your advanced directive form at your convenience.   Let us  know if you need anything.

## 2023-12-01 ENCOUNTER — Ambulatory Visit: Admitting: Physical Therapy

## 2023-12-19 ENCOUNTER — Telehealth: Admitting: Physician Assistant

## 2023-12-19 DIAGNOSIS — H1031 Unspecified acute conjunctivitis, right eye: Secondary | ICD-10-CM | POA: Diagnosis not present

## 2023-12-19 MED ORDER — POLYMYXIN B-TRIMETHOPRIM 10000-0.1 UNIT/ML-% OP SOLN: OPHTHALMIC | 0 refills | Status: DC

## 2023-12-19 NOTE — Progress Notes (Signed)

## 2023-12-19 NOTE — Progress Notes (Signed)
 I have spent 5 minutes in review of e-visit questionnaire, review and updating patient chart, medical decision making and response to patient.   Elsie Velma Lunger, PA-C

## 2024-01-29 ENCOUNTER — Other Ambulatory Visit: Payer: Self-pay | Admitting: Family Medicine

## 2024-02-03 ENCOUNTER — Encounter: Payer: Self-pay | Admitting: Family Medicine

## 2024-02-05 ENCOUNTER — Encounter: Payer: Self-pay | Admitting: Radiology

## 2024-02-06 ENCOUNTER — Ambulatory Visit: Payer: Self-pay

## 2024-02-06 ENCOUNTER — Ambulatory Visit: Admitting: Family Medicine

## 2024-02-06 ENCOUNTER — Encounter: Payer: Self-pay | Admitting: Family Medicine

## 2024-02-06 VITALS — BP 147/80 | HR 93 | Temp 100.1°F | Resp 18 | Wt 299.8 lb

## 2024-02-06 DIAGNOSIS — J101 Influenza due to other identified influenza virus with other respiratory manifestations: Secondary | ICD-10-CM

## 2024-02-06 DIAGNOSIS — I10 Essential (primary) hypertension: Secondary | ICD-10-CM

## 2024-02-06 LAB — POCT INFLUENZA A/B
Influenza A, POC: POSITIVE — AB
Influenza B, POC: NEGATIVE

## 2024-02-06 LAB — POC COVID19 BINAXNOW: SARS Coronavirus 2 Ag: NEGATIVE

## 2024-02-06 MED ORDER — OSELTAMIVIR PHOSPHATE 75 MG PO CAPS: 75.0000 mg | ORAL_CAPSULE | Freq: Two times a day (BID) | ORAL | 0 refills | Status: AC

## 2024-02-06 MED ORDER — NAPROXEN 500 MG PO TABS: 500.0000 mg | ORAL_TABLET | Freq: Two times a day (BID) | ORAL | 0 refills | Status: AC

## 2024-02-06 MED ORDER — PROMETHAZINE-DM 6.25-15 MG/5ML PO SYRP: 5.0000 mL | ORAL_SOLUTION | Freq: Four times a day (QID) | ORAL | 0 refills | Status: AC | PRN

## 2024-02-06 NOTE — Telephone Encounter (Signed)
 FYI Only or Action Required?: FYI only for provider: appointment scheduled on 02/06/24.  Patient was last seen in primary care on 11/29/2023 by Frann Mabel Mt, DO.  Called Nurse Triage reporting Cough.  Symptoms began several days ago.  Interventions attempted: OTC medications: NyQuil.  Symptoms are: gradually worsening.  Triage Disposition: See Physician Within 24 Hours  Patient/caregiver understands and will follow disposition?: Yes                            Copied from CRM #[id number]. Topic: Clinical - Red Word Triage >> Feb 06, 2024  8:36 AM Rea ORN wrote: Red Word that prompted transfer to Nurse Triage: chest congestion, wet cough, lack of appetite, chills. Mold was found in the pt apt this week. He is unsure if the sx are related to that or if he picked up something from his child who is sick. Reason for Disposition  SEVERE coughing spells (e.g., whooping sound after coughing, vomiting after coughing)  Answer Assessment - Initial Assessment Questions 1. ONSET: When did the cough begin?      Sunday  2. SEVERITY: How bad is the cough today?      Moderate-severe- today has been more 3. SPUTUM: Describe the color of your sputum (e.g., none, dry cough; clear, white, yellow, green)     Unsure, states anything that comes up is white/green 4. HEMOPTYSIS: Are you coughing up any blood? If Yes, ask: How much? (e.g., flecks, streaks, tablespoons, etc.)     Denies 5. DIFFICULTY BREATHING: Are you having difficulty breathing? If Yes, ask: How bad is it? (e.g., mild, moderate, severe)      Denies, reports chest congestion  6. FEVER: Do you have a fever? If Yes, ask: What is your temperature, how was it measured, and when did it start?     97 .9 while on phone with this RN, wife reports patient feels super hot 7. CARDIAC HISTORY: Do you have any history of heart disease? (e.g., heart attack, congestive heart failure)      Patient reports  hypertension, but states he has not measured his BP recently  8. LUNG HISTORY: Do you have any history of lung disease?  (e.g., pulmonary embolus, asthma, emphysema)     Denies 9. PE RISK FACTORS: Do you have a history of blood clots? (or: recent major surgery, recent prolonged travel, bedridden)     Denies 10. OTHER SYMPTOMS: Do you have any other symptoms? (e.g., runny nose, wheezing, chest pain)     Generalized fatigue and weakness, headache, dry throat, night sweats, diarrhea on Sunday, denies wheezing, denies earache, denies blurred vision, denies chest pain 11. PREGNANCY: Is there any chance you are pregnant? When was your last menstrual period?     N/A 12. TRAVEL: Have you traveled out of the country in the last month? (e.g., travel history, exposures)     N/A    Patient requested to be seen in office today. No availability with PCP office today. Patient scheduled for same day appointment at alternate office within region.  Protocols used: Cough - Acute Productive-A-AH

## 2024-02-06 NOTE — Telephone Encounter (Signed)
 Appt scheduled

## 2024-02-06 NOTE — Progress Notes (Signed)
 " Assessment & Plan   Assessment/Plan:     Assessment & Plan Influenza with respiratory and systemic symptoms Acute influenza infection confirmed by positive test, presenting with fever, chills, cough, congestion, fatigue, loss of appetite, and diarrhea. Symptoms began on Sunday, with current day being Tuesday. Vital signs include low-grade fever (100.68F), elevated heart rate (93 bpm), and blood pressure (147/80 mmHg). Weight loss of 9 pounds likely due to fluid loss from diarrhea and poor oral intake. No wheezing on examination, but good air movement. Symptoms consistent with influenza, not related to black mold exposure. - Prescribed Tamiflu  75 mg twice daily for 5 days to shorten duration and severity of symptoms. - Prescribed naproxen  500 mg twice daily as needed for fever, chills, and aches. - Recommended promethazine  DM 5 mL every 4 hours as needed for cough, especially at night. - Advised increased fluid and electrolyte intake, such as Pedialyte or water with saltines. - Provided documentation for work absence if needed.  Hypertension Blood pressure readings elevated, likely due to influenza and associated symptoms. Current medications include metoprolol  and losartan . Blood pressure fluctuations expected during illness. No immediate changes to antihypertensive regimen recommended. - Continue current antihypertensive medications (metoprolol  and losartan ). - Monitor blood pressure; if elevated for more than a week post-illness, reassess management.      Medications Discontinued During This Encounter  Medication Reason   trimethoprim -polymyxin b  (POLYTRIM ) ophthalmic solution     Return if symptoms worsen or fail to improve.        Subjective:   Encounter date: 02/06/2024  Brendan Gregory is a 43 y.o. male who has Obstructive sleep apnea on CPAP; Essential hypertension; Morbid obesity (HCC); Agatston coronary artery calcium  score less than 100; Solitary pulmonary nodule;  Panic attack; Generalized anxiety disorder; Acute cough; Pansinusitis; Weakness; Hypokalemia; Pigmented villonodular synovitis of right knee; and Synovial chondromatosis on their problem list..   He  has a past medical history of Agatston coronary artery calcium  score less than 100 and Hypertension.SABRA   He presents with chief complaint of URI (Pt c/o of cough, congestion, fever, hot and cold chills, fatigue and loss of appetite with headache for 3 days. Pt used OTC Nyquil.  Pt also voiced mold in apartment. //HM due- flu vaccine ) .   Discussed the use of AI scribe software for clinical note transcription with the patient, who gave verbal consent to proceed.  History of Present Illness Brendan Gregory is a 43 year old male who presents with flu-like symptoms.  Constitutional and upper respiratory symptoms - Flu-like symptoms for 3 days, onset Sunday morning after breakfast with friends - Cough, nasal congestion, fatigue, headache, and loss of appetite - Low-grade fever up to 100.68F with associated hot and cold chills, night sweats, and fever dreams - Significant fatigue and malaise - Taking over-the-counter NyQuil for symptom management  Gastrointestinal symptoms - Diarrhea began Sunday afternoon, no bowel movement since Sunday - Decreased appetite with minimal oral intake (banana and some soup) - Weight loss of 8 pounds over the past 6 days, attributed to poor oral intake and diarrhea - Maintaining hydration with water and orange juice  Neurological and sensory symptoms - Headache present for several days - Ear ringing (tinnitus) ongoing and distressing  Peripheral vascular and sensory changes - Sensation of coldness and perceived discoloration in foot, not confirmed by wife  Cardiovascular symptoms and hypertension - History of hypertension, currently taking metoprolol  - Blood pressure fluctuating, recent readings around 147/80 mmHg - Heart rate elevated, ranging from  96 to  98 bpm, but lower since starting metoprolol  and exercising  Environmental exposure concern - Concern for possible black mold exposure due to recent leak in apartment  ROS  Past Surgical History:  Procedure Laterality Date   MOLE REMOVAL     outpatient procedure   testicular     for undescenced testicle    Current Outpatient Medications on File Prior to Visit  Medication Sig Dispense Refill   atorvastatin  (LIPITOR) 10 MG tablet TAKE 1 TABLET BY MOUTH EVERY DAY 90 tablet 3   Garlic (GARLIQUE) 400 MG TBEC Take by mouth.     losartan  (COZAAR ) 100 MG tablet Take 1 tablet (100 mg total) by mouth daily. 90 tablet 1   metoprolol  succinate (TOPROL -XL) 50 MG 24 hr tablet Take 1 tablet (50 mg total) by mouth daily. Take with or immediately following a meal. 30 tablet 3   OVER THE COUNTER MEDICATION Take 1 capsule by mouth daily at 6 (six) AM. Aged beet root     No current facility-administered medications on file prior to visit.    Family History  Problem Relation Age of Onset   ADD / ADHD Mother    Prostate cancer Father 46   Diabetes Father    Hypertension Father    Cancer Father    Diabetes Brother    Hypertension Brother    Diabetes Maternal Grandfather     Social History   Socioeconomic History   Marital status: Married    Spouse name: Not on file   Number of children: Not on file   Years of education: Not on file   Highest education level: Some college, no degree  Occupational History   Not on file  Tobacco Use   Smoking status: Never    Passive exposure: Never   Smokeless tobacco: Never  Vaping Use   Vaping status: Former   Substances: THC  Substance and Sexual Activity   Alcohol use: Not Currently   Drug use: Not Currently    Frequency: 7.0 times per week    Types: Marijuana    Comment: daily use marijuana smoking.  Quit 2021.   Sexual activity: Never  Other Topics Concern   Not on file  Social History Narrative   Not on file   Social Drivers of Health    Financial Resource Strain: Low Risk  (02/22/2023)   Overall Financial Resource Strain (CARDIA)    Difficulty of Paying Living Expenses: Not very hard  Food Insecurity: Food Insecurity Present (02/22/2023)   Hunger Vital Sign    Worried About Running Out of Food in the Last Year: Sometimes true    Ran Out of Food in the Last Year: Never true  Transportation Needs: No Transportation Needs (02/22/2023)   PRAPARE - Administrator, Civil Service (Medical): No    Lack of Transportation (Non-Medical): No  Physical Activity: Sufficiently Active (02/22/2023)   Exercise Vital Sign    Days of Exercise per Week: 6 days    Minutes of Exercise per Session: 30 min  Stress: Stress Concern Present (02/22/2023)   [employer] of Occupational Health - Occupational Stress Questionnaire    Feeling of Stress : To some extent  Social Connections: Moderately Isolated (02/22/2023)   Social Connection and Isolation Panel    Frequency of Communication with Friends and Family: More than three times a week    Frequency of Social Gatherings with Friends and Family: More than three times a week    Attends Religious  Services: Never    Active Member of Clubs or Organizations: No    Attends Engineer, Structural: Not on file    Marital Status: Married  Intimate Partner Violence: Unknown (03/06/2022)   Received from [facility]   HITS    Physically Hurt: Not on file    Insult or Talk Down To: Not on file    Threaten Physical Harm: Not on file    Scream or Curse: Not on file                                                                                                  Objective:  Physical Exam: BP (!) 147/80 (BP Location: Right Arm, Patient Position: Sitting, Cuff Size: Large) Comment: recheck  Pulse 93   Temp 100.1 F (37.8 C) (Oral)   Resp 18   Wt 299 lb 12.8 oz (136 kg)   HC 18 (45.7 cm)   SpO2 97%   BMI 41.81 kg/m   Wt Readings from Last 3 Encounters:  02/06/24 299  lb 12.8 oz (136 kg)  11/29/23 (!) 308 lb (139.7 kg)  09/05/23 290 lb (131.5 kg)   BP Readings from Last 3 Encounters:  02/06/24 (!) 147/80  11/29/23 (!) 148/92  09/05/23 138/84    Physical Exam           Physical Exam  No results found.  Recent Results (from the past 2160 hours)  CBC     Status: None   Collection Time: 11/29/23 10:36 AM  Result Value Ref Range   WBC 8.2 4.0 - 10.5 K/uL   RBC 4.90 4.22 - 5.81 Mil/uL   Platelets 289.0 150.0 - 400.0 K/uL   Hemoglobin 14.7 13.0 - 17.0 g/dL   HCT 56.1 60.9 - 47.9 %   MCV 89.3 78.0 - 100.0 fl   MCHC 33.6 30.0 - 36.0 g/dL   RDW 86.7 88.4 - 84.4 %  Comprehensive metabolic panel with GFR     Status: None   Collection Time: 11/29/23 10:36 AM  Result Value Ref Range   Sodium 138 135 - 145 mEq/L   Potassium 4.0 3.5 - 5.1 mEq/L   Chloride 101 96 - 112 mEq/L   CO2 28 19 - 32 mEq/L   Glucose, Bld 82 70 - 99 mg/dL   BUN 17 6 - 23 mg/dL   Creatinine, Ser 9.11 0.40 - 1.50 mg/dL   Total Bilirubin 0.6 0.2 - 1.2 mg/dL   Alkaline Phosphatase 103 39 - 117 U/L   AST 27 0 - 37 U/L   ALT 48 0 - 53 U/L   Total Protein 7.5 6.0 - 8.3 g/dL   Albumin 4.5 3.5 - 5.2 g/dL   GFR 893.43 >39.99 mL/min    Comment: Calculated using the CKD-EPI Creatinine Equation (2021)   Calcium  9.1 8.4 - 10.5 mg/dL  Lipid panel     Status: None   Collection Time: 11/29/23 10:36 AM  Result Value Ref Range   Cholesterol 129 0 - 200 mg/dL    Comment: ATP III Classification       Desirable:  <  200 mg/dL               Borderline High:  200 - 239 mg/dL          High:  > = 759 mg/dL   Triglycerides 899.9 0.0 - 149.0 mg/dL    Comment: Normal:  <849 mg/dLBorderline High:  150 - 199 mg/dL   HDL 55.39 >60.99 mg/dL   VLDL 79.9 0.0 - 59.9 mg/dL   LDL Cholesterol 65 0 - 99 mg/dL   Total CHOL/HDL Ratio 3     Comment:                Men          Women1/2 Average Risk     3.4          3.3Average Risk          5.0          4.42X Average Risk          9.6          7.13X Average  Risk          15.0          11.0                       NonHDL 84.67     Comment: NOTE:  Non-HDL goal should be 30 mg/dL higher than patient's LDL goal (i.e. LDL goal of < 70 mg/dL, would have non-HDL goal of < 100 mg/dL)  PSA     Status: None   Collection Time: 11/29/23 10:36 AM  Result Value Ref Range   PSA 0.29 0.10 - 4.00 ng/mL    Comment: Test performed using Access Hybritech PSA Assay, a parmagnetic partical, chemiluminecent immunoassay.  POCT Influenza A/B     Status: Abnormal   Collection Time: 02/06/24  1:29 PM  Result Value Ref Range   Influenza A, POC Positive (A) Negative   Influenza B, POC Negative Negative  POC COVID-19 BinaxNow     Status: Normal   Collection Time: 02/06/24  1:30 PM  Result Value Ref Range   SARS Coronavirus 2 Ag Negative Negative        Beverley KATHEE Hummer, MD  I,Emily Lagle,acting as a scribe for Beverley KATHEE Hummer, MD.,have documented all relevant documentation on the behalf of Beverley KATHEE Hummer, MD.  LILLETTE Beverley KATHEE Hummer, MD, have reviewed all documentation for this visit. The documentation on 02/06/2024 for the exam, diagnosis, procedures, and orders are all accurate and complete. "

## 2024-02-06 NOTE — Patient Instructions (Addendum)
 Please follow up if symptoms doesn't improve.  Prescriptions was sent to your pharmacy  Please rest and stay hydrated

## 2024-02-07 ENCOUNTER — Encounter: Payer: Self-pay | Admitting: Family Medicine

## 2024-02-19 ENCOUNTER — Telehealth: Admitting: Physician Assistant

## 2024-02-19 DIAGNOSIS — J208 Acute bronchitis due to other specified organisms: Secondary | ICD-10-CM | POA: Diagnosis not present

## 2024-02-19 DIAGNOSIS — B9689 Other specified bacterial agents as the cause of diseases classified elsewhere: Secondary | ICD-10-CM

## 2024-02-19 MED ORDER — PREDNISONE 20 MG PO TABS: 40.0000 mg | ORAL_TABLET | Freq: Every day | ORAL | 0 refills | Status: AC

## 2024-02-19 MED ORDER — ALBUTEROL SULFATE HFA 108 (90 BASE) MCG/ACT IN AERS: 1.0000 | INHALATION_SPRAY | Freq: Four times a day (QID) | RESPIRATORY_TRACT | 0 refills | Status: AC | PRN

## 2024-02-19 MED ORDER — AZITHROMYCIN 250 MG PO TABS: ORAL_TABLET | ORAL | 0 refills | Status: AC

## 2024-02-19 NOTE — Progress Notes (Signed)
 Virtual Visit Consent   Durwin Davisson, you are scheduled for a virtual visit with a [facility] provider today. Just as with appointments in the office, your consent must be obtained to participate. Your consent will be active for this visit and any virtual visit you may have with one of our providers in the next 365 days. If you have a MyChart account, a copy of this consent can be sent to you electronically.  As this is a virtual visit, video technology does not allow for your provider to perform a traditional examination. This may limit your provider's ability to fully assess your condition. If your provider identifies any concerns that need to be evaluated in person or the need to arrange testing (such as labs, EKG, etc.), we will make arrangements to do so. Although advances in technology are sophisticated, we cannot ensure that it will always work on either your end or our end. If the connection with a video visit is poor, the visit may have to be switched to a telephone visit. With either a video or telephone visit, we are not always able to ensure that we have a secure connection.  By engaging in this virtual visit, you consent to the provision of healthcare and authorize for your insurance to be billed (if applicable) for the services provided during this visit. Depending on your insurance coverage, you may receive a charge related to this service.  I need to obtain your verbal consent now. Are you willing to proceed with your visit today? Angelina Neece has provided verbal consent on 02/19/2024 for a virtual visit (video or telephone). Delon CHRISTELLA Dickinson, PA-C  Date: 02/19/2024 7:25 PM   Virtual Visit via Video Note   I, Delon CHRISTELLA Dickinson, connected with  Brendan Gregory  ([id number], 1980/06/08) on 02/19/24 at  7:15 PM EST by a video-enabled telemedicine application and verified that I am speaking with the correct person using two identifiers.  Location: Patient: Virtual Visit Location  Patient: Home Provider: Virtual Visit Location Provider: Home Office   I discussed the limitations of evaluation and management by telemedicine and the availability of in person appointments. The patient expressed understanding and agreed to proceed.    History of Present Illness: Brendan Gregory is a 43 y.o. who identifies as a male who was assigned male at birth, and is being seen today for cough and congestion following Influenza.  HPI: URI  This is a new problem. The current episode started 1 to 4 weeks ago (Diagnosed with Flu A on 02/06/24, flu-symptoms of fever, aches improved; cough, chest tightness, mild SOB all worsening). The problem has been gradually worsening. There has been no fever. Associated symptoms include chest pain (center and into throat), congestion, coughing (vomit-inducing once in shower), headaches, nausea, vomiting (once from cough) and wheezing. Pertinent negatives include no ear pain, plugged ear sensation, rhinorrhea, sinus pain or sore throat. Associated symptoms comments: Feels crackles with exhaling. He has tried increased fluids (Tamiflu , mucinex high BP, Nyquil high BP, Nyquil) for the symptoms. The treatment provided no relief.     Problems:  Patient Active Problem List   Diagnosis Date Noted   Synovial chondromatosis 10/20/2023   Pigmented villonodular synovitis of right knee 08/09/2023   Hypokalemia 03/14/2022   Acute cough 03/08/2022   Pansinusitis 03/08/2022   Weakness 03/08/2022   Panic attack 10/25/2021   Generalized anxiety disorder 10/25/2021   Solitary pulmonary nodule 05/04/2021   Agatston coronary artery calcium  score less than 100 01/25/2021   Morbid  obesity (HCC) 11/24/2020   Obstructive sleep apnea on CPAP 05/16/2019   Essential hypertension 05/16/2019    Allergies:  Allergies  Allergen Reactions   Amlodipine  Nausea Only    HA and nausea   Medications:  Current Outpatient Medications:    albuterol  (VENTOLIN  HFA) 108 (90 Base)  MCG/ACT inhaler, Inhale 1-2 puffs into the lungs every 6 (six) hours as needed., Disp: 8 g, Rfl: 0   azithromycin (ZITHROMAX) 250 MG tablet, Take 2 tablets on day 1, then 1 tablet daily on days 2 through 5, Disp: 6 tablet, Rfl: 0   predniSONE  (DELTASONE ) 20 MG tablet, Take 2 tablets (40 mg total) by mouth daily with breakfast., Disp: 10 tablet, Rfl: 0   atorvastatin  (LIPITOR) 10 MG tablet, TAKE 1 TABLET BY MOUTH EVERY DAY, Disp: 90 tablet, Rfl: 3   Garlic (GARLIQUE) 400 MG TBEC, Take by mouth., Disp: , Rfl:    losartan  (COZAAR ) 100 MG tablet, Take 1 tablet (100 mg total) by mouth daily., Disp: 90 tablet, Rfl: 1   metoprolol  succinate (TOPROL -XL) 50 MG 24 hr tablet, Take 1 tablet (50 mg total) by mouth daily. Take with or immediately following a meal., Disp: 30 tablet, Rfl: 3   naproxen  (NAPROSYN ) 500 MG tablet, Take 1 tablet (500 mg total) by mouth 2 (two) times daily with a meal., Disp: 30 tablet, Rfl: 0   oseltamivir  (TAMIFLU ) 75 MG capsule, Take 1 capsule (75 mg total) by mouth 2 (two) times daily., Disp: 10 capsule, Rfl: 0   OVER THE COUNTER MEDICATION, Take 1 capsule by mouth daily at 6 (six) AM. Aged beet root, Disp: , Rfl:    promethazine -dextromethorphan (PROMETHAZINE -DM) 6.25-15 MG/5ML syrup, Take 5 mLs by mouth 4 (four) times daily as needed., Disp: 118 mL, Rfl: 0  Observations/Objective: Patient is well-developed, well-nourished in no acute distress.  Resting comfortably at home.  Head is normocephalic, atraumatic.  No labored breathing.  Speech is clear and coherent with logical content.  Patient is alert and oriented at baseline.    Assessment and Plan: 1. Acute bacterial bronchitis (Primary) - azithromycin (ZITHROMAX) 250 MG tablet; Take 2 tablets on day 1, then 1 tablet daily on days 2 through 5  Dispense: 6 tablet; Refill: 0 - predniSONE  (DELTASONE ) 20 MG tablet; Take 2 tablets (40 mg total) by mouth daily with breakfast.  Dispense: 10 tablet; Refill: 0 - albuterol  (VENTOLIN   HFA) 108 (90 Base) MCG/ACT inhaler; Inhale 1-2 puffs into the lungs every 6 (six) hours as needed.  Dispense: 8 g; Refill: 0  - Worsening over a week despite OTC medications - Will treat with Z-pack and Prednisone  - Can continue Mucinex during the daytime, Nyquil at bedtime - Albuterol  added for chest tightness and wheezing - Push fluids.  - Rest.  - Steam and humidifier can help - Seek in person evaluation if worsening or symptoms fail to improve    Follow Up Instructions: I discussed the assessment and treatment plan with the patient. The patient was provided an opportunity to ask questions and all were answered. The patient agreed with the plan and demonstrated an understanding of the instructions.  A copy of instructions were sent to the patient via MyChart unless otherwise noted below.    The patient was advised to call back or seek an in-person evaluation if the symptoms worsen or if the condition fails to improve as anticipated.    Delon CHRISTELLA Dickinson, PA-C

## 2024-02-19 NOTE — Patient Instructions (Signed)
 Brendan Gregory, thank you for joining Delon CHRISTELLA Dickinson, PA-C for today's virtual visit.  While this provider is not your primary care provider (PCP), if your PCP is located in our provider database this encounter information will be shared with them immediately following your visit.   A [name] MyChart account gives you access to today's visit and all your visits, tests, and labs performed at [facility]  click here if you don't have a [facility] MyChart account or go to [email or web address]  Consent: (Patient) Brendan Gregory provided verbal consent for this virtual visit at the beginning of the encounter.  Current Medications:  Current Outpatient Medications:    albuterol  (VENTOLIN  HFA) 108 (90 Base) MCG/ACT inhaler, Inhale 1-2 puffs into the lungs every 6 (six) hours as needed., Disp: 8 g, Rfl: 0   azithromycin (ZITHROMAX) 250 MG tablet, Take 2 tablets on day 1, then 1 tablet daily on days 2 through 5, Disp: 6 tablet, Rfl: 0   predniSONE  (DELTASONE ) 20 MG tablet, Take 2 tablets (40 mg total) by mouth daily with breakfast., Disp: 10 tablet, Rfl: 0   atorvastatin  (LIPITOR) 10 MG tablet, TAKE 1 TABLET BY MOUTH EVERY DAY, Disp: 90 tablet, Rfl: 3   Garlic (GARLIQUE) 400 MG TBEC, Take by mouth., Disp: , Rfl:    losartan  (COZAAR ) 100 MG tablet, Take 1 tablet (100 mg total) by mouth daily., Disp: 90 tablet, Rfl: 1   metoprolol  succinate (TOPROL -XL) 50 MG 24 hr tablet, Take 1 tablet (50 mg total) by mouth daily. Take with or immediately following a meal., Disp: 30 tablet, Rfl: 3   naproxen  (NAPROSYN ) 500 MG tablet, Take 1 tablet (500 mg total) by mouth 2 (two) times daily with a meal., Disp: 30 tablet, Rfl: 0   oseltamivir  (TAMIFLU ) 75 MG capsule, Take 1 capsule (75 mg total) by mouth 2 (two) times daily., Disp: 10 capsule, Rfl: 0   OVER THE COUNTER MEDICATION, Take 1 capsule by mouth daily at 6 (six) AM. Aged beet root, Disp: , Rfl:    promethazine -dextromethorphan  (PROMETHAZINE -DM) 6.25-15 MG/5ML syrup, Take 5 mLs by mouth 4 (four) times daily as needed., Disp: 118 mL, Rfl: 0   Medications ordered in this encounter:  Meds ordered this encounter  Medications   azithromycin (ZITHROMAX) 250 MG tablet    Sig: Take 2 tablets on day 1, then 1 tablet daily on days 2 through 5    Dispense:  6 tablet    Refill:  0    Supervising Provider:   LAMPTEY, PHILIP O [[id number]]   predniSONE  (DELTASONE ) 20 MG tablet    Sig: Take 2 tablets (40 mg total) by mouth daily with breakfast.    Dispense:  10 tablet    Refill:  0    Supervising Provider:   LAMPTEY, PHILIP O [[id number]]   albuterol  (VENTOLIN  HFA) 108 (90 Base) MCG/ACT inhaler    Sig: Inhale 1-2 puffs into the lungs every 6 (six) hours as needed.    Dispense:  8 g    Refill:  0    Supervising Provider:   BLAISE ALEENE KIDD [[id number]]     *If you need refills on other medications prior to your next appointment, please contact your pharmacy*  Follow-Up: Call back or seek an in-person evaluation if the symptoms worsen or if the condition fails to improve as anticipated.  [facility] [phone]  Other Instructions Acute Bronchitis, Adult  Acute bronchitis is sudden inflammation of the main airways (bronchi) that come  off the windpipe (trachea) in the lungs. The swelling causes the airways to get smaller and make more mucus than normal. This can make it hard to breathe and can cause coughing or noisy breathing (wheezing). Acute bronchitis may last several weeks. The cough may last longer. Allergies, asthma, and exposure to smoke may make the condition worse. What are the causes? This condition can be caused by germs and by substances that irritate the lungs, including: Cold and flu viruses. The most common cause of this condition is the virus that causes the common cold. Bacteria. This is less common. Breathing in substances that irritate the lungs, including: Smoke from cigarettes and other  forms of tobacco. Dust and pollen. Fumes from household cleaning products, gases, or burned fuel. Indoor or outdoor air pollution. What increases the risk? The following factors may make you more likely to develop this condition: A weak body's defense system, also called the immune system. A condition that affects your lungs and breathing, such as asthma. What are the signs or symptoms? Common symptoms of this condition include: Coughing. This may bring up clear, yellow, or green mucus from your lungs (sputum). Wheezing. Runny or stuffy nose. Having too much mucus in your lungs (chest congestion). Shortness of breath. Aches and pains, including sore throat or chest. How is this diagnosed? This condition is usually diagnosed based on: Your symptoms and medical history. A physical exam. You may also have other tests, including tests to rule out other conditions, such as pneumonia. These tests include: A test of lung function. Test of a mucus sample to look for the presence of bacteria. Tests to check the oxygen level in your blood. Blood tests. Chest X-ray. How is this treated? Most cases of acute bronchitis clear up over time without treatment. Your health care provider may recommend: Drinking more fluids to help thin your mucus so it is easier to cough up. Taking inhaled medicine (inhaler) to improve air flow in and out of your lungs. Using a vaporizer or a humidifier. These are machines that add water to the air to help you breathe better. Taking a medicine that thins mucus and clears congestion (expectorant). Taking a medicine that prevents or stops coughing (cough suppressant). It is not common to take an antibiotic medicine for this condition. Follow these instructions at home:  Take over-the-counter and prescription medicines only as told by your health care provider. Use an inhaler, vaporizer, or humidifier as told by your health care provider. Take two teaspoons (10 mL)  of honey at bedtime to lessen coughing at night. Drink enough fluid to keep your urine pale yellow. Do not use any products that contain nicotine or tobacco. These products include cigarettes, chewing tobacco, and vaping devices, such as e-cigarettes. If you need help quitting, ask your health care provider. Get plenty of rest. Return to your normal activities as told by your health care provider. Ask your health care provider what activities are safe for you. Keep all follow-up visits. This is important. How is this prevented? To lower your risk of getting this condition again: Wash your hands often with soap and water for at least 20 seconds. If soap and water are not available, use hand sanitizer. Avoid contact with people who have cold symptoms. Try not to touch your mouth, nose, or eyes with your hands. Avoid breathing in smoke or chemical fumes. Breathing smoke or chemical fumes will make your condition worse. Get the flu shot every year. Contact a health care provider if:  Your symptoms do not improve after 2 weeks. You have trouble coughing up the mucus. Your cough keeps you awake at night. You have a fever. Get help right away if you: Cough up blood. Feel pain in your chest. Have severe shortness of breath. Faint or keep feeling like you are going to faint. Have a severe headache. Have a fever or chills that get worse. These symptoms may represent a serious problem that is an emergency. Do not wait to see if the symptoms will go away. Get medical help right away. Call your local emergency services (911 in the [place] Do not drive yourself to the hospital. Summary Acute bronchitis is inflammation of the main airways (bronchi) that come off the windpipe (trachea) in the lungs. The swelling causes the airways to get smaller and make more mucus than normal. Drinking more fluids can help thin your mucus so it is easier to cough up. Take over-the-counter and prescription medicines only  as told by your health care provider. Do not use any products that contain nicotine or tobacco. These products include cigarettes, chewing tobacco, and vaping devices, such as e-cigarettes. If you need help quitting, ask your health care provider. Contact a health care provider if your symptoms do not improve after 2 weeks. This information is not intended to replace advice given to you by your health care provider. Make sure you discuss any questions you have with your health care provider. Document Revised: 07/01/2021 Document Reviewed: 07/22/2020 Elsevier Patient Education  2024 [employer]   If you have been instructed to have an in-person evaluation today at a local Urgent Care facility, please use the link below. It will take you to a list of all of our available [facility], including address, phone number and hours of operation. Please do not delay care.  [facility]  If you or a family member do not have a primary care provider, use the link below to schedule a visit and establish care. When you choose a [facility] primary care physician or advanced practice provider, you gain a long-term partner in health. Find a Primary Care Provider  Learn more about [facility] in-office and virtual care options: [facility] - Get Care Now

## 2024-03-14 ENCOUNTER — Other Ambulatory Visit: Payer: Self-pay

## 2024-03-14 DIAGNOSIS — I10 Essential (primary) hypertension: Secondary | ICD-10-CM

## 2024-03-14 MED ORDER — ATORVASTATIN CALCIUM 10 MG PO TABS: 10.0000 mg | ORAL_TABLET | Freq: Every day | ORAL | 1 refills | Status: AC

## 2024-03-14 MED ORDER — METOPROLOL SUCCINATE ER 50 MG PO TB24: 50.0000 mg | ORAL_TABLET | Freq: Every day | ORAL | 1 refills | Status: AC

## 2024-05-29 ENCOUNTER — Ambulatory Visit: Admitting: Family Medicine
